# Patient Record
Sex: Male | Born: 1950 | Race: White | Hispanic: No | Smoking: Current every day smoker
Health system: Southern US, Community
[De-identification: ages and names within clinical notes are randomized; demographics above are authoritative.]

## PROBLEM LIST (undated history)

## (undated) DIAGNOSIS — C801 Malignant (primary) neoplasm, unspecified: Secondary | ICD-10-CM

## (undated) DIAGNOSIS — Z923 Personal history of irradiation: Secondary | ICD-10-CM

---

## 2013-05-05 HISTORY — PX: LUNG BIOPSY: SHX232

## 2013-05-12 ENCOUNTER — Telehealth: Payer: Self-pay | Admitting: Internal Medicine

## 2013-05-12 NOTE — Telephone Encounter (Signed)
C/D 05/12/13 for appt. 05/14/13 °

## 2013-05-13 ENCOUNTER — Telehealth: Payer: Self-pay | Admitting: *Deleted

## 2013-05-13 NOTE — Telephone Encounter (Signed)
Left VM message regard appt for MTOC 05/14/13 at 1:00

## 2013-05-14 ENCOUNTER — Encounter: Payer: Self-pay | Admitting: Internal Medicine

## 2013-05-14 ENCOUNTER — Encounter: Payer: Self-pay | Admitting: *Deleted

## 2013-05-14 ENCOUNTER — Ambulatory Visit (HOSPITAL_BASED_OUTPATIENT_CLINIC_OR_DEPARTMENT_OTHER): Payer: Self-pay | Admitting: Internal Medicine

## 2013-05-14 ENCOUNTER — Ambulatory Visit
Admission: RE | Admit: 2013-05-14 | Discharge: 2013-05-14 | Disposition: A | Discharge status: Home / Self Care | Payer: Self-pay | Source: Ambulatory Visit | Attending: Radiation Oncology | Admitting: Radiation Oncology

## 2013-05-14 ENCOUNTER — Encounter: Payer: Self-pay | Admitting: Specialist

## 2013-05-14 ENCOUNTER — Telehealth: Payer: Self-pay | Admitting: Internal Medicine

## 2013-05-14 ENCOUNTER — Telehealth: Payer: Self-pay | Admitting: *Deleted

## 2013-05-14 DIAGNOSIS — F17200 Nicotine dependence, unspecified, uncomplicated: Secondary | ICD-10-CM

## 2013-05-14 DIAGNOSIS — C3490 Malignant neoplasm of unspecified part of unspecified bronchus or lung: Secondary | ICD-10-CM

## 2013-05-14 NOTE — Patient Instructions (Signed)
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 We discussed treatment options including systemic chemotherapy with radiation. Followup visit in 3 weeks.

## 2013-05-14 NOTE — Progress Notes (Signed)
I met Jacob Webb and his daughter, Jacob Webb, in thoracic clinic today.  I provided them with information on available services in the Patient and [facility], as well as my contact information.  The one need they identified today was for financial assistance to keep Jacob Webb's phone service turned on.  I will make a referral to Kathrin Penner, social worker, to assist them with this matter.

## 2013-05-14 NOTE — Telephone Encounter (Signed)
Per staff phone call and POF I have schedueld appts.  JMW  

## 2013-05-14 NOTE — Progress Notes (Signed)
[facility] [phone]   [phone]  CONSULT NOTE  REFERRING PHYSICIAN: Veverly Fells PA-C  REASON FOR CONSULTATION:  62 years old white male diagnosed with lung cancer.   HPI Zackry Deines is a 62 y.o. male with no significant past medical history but long history of smoking. The patient presented to the emergency Department at [facility] medical center less than 2 weeks ago complaining of left-sided chest pain. Chest x-ray at that time showed left lung mass. This was followed by CT scan of the chest on 04/27/2013 and it showed heterogeneous mass in the left superior hilar region extending into the left hilum with encasement of the left pulmonary artery and left pulmonary veins. The mass measured 3.9 x 6.9 x 9.4 CM. The mass extended posteriorly and medially to the pleura. The left pulmonary artery was narrowed by 30%. There are small subcentimeter precarinal lymph nodes as well as left-sided mediastinal lymphadenopathy and left hilar lymphadenopathy. On 05/05/2013 the patient underwent CT-guided needle biopsy of the left upper lobe lung mass and the final pathology from [facility] (case # [id number]) showed small cell carcinoma. The tumor cells were immunoreactive for TTF 1, Pan keratin, CD 56 and CK7. there was focal reactivity for synaptophysin. The tumor cells were nonreactive for CK 5/6 and CK 20. This pattern of immunoreactivity as well as the histologic appearance is most consistent with small cell carcinoma. The patient was referred to me today for further evaluation and recommendation regarding treatment of his condition. When seen today he still complaining of left-sided chest pain as well as fatigue. He is currently on hydrocodone for pain management but he does not take much of it and he takes only half a tablet of aspirin which does help his pain. He also has shortness breath at baseline and increased with exertion as well as mild  cough but no hemoptysis. He has no significant weight loss. He has no headache or visual changes.  PAST MEDICAL HISTORY: Unremarkable. The patient denied having any history of hypertension, diabetes mellitus, chronic disease, hypercholesterolemia or stroke.  FAMILY HISTORY: Mother died from brain aneurysm.  SOCIAL HISTORY: He is sedated for 17 years. He was accompanied by his daughter Wanda Plump. He is currently retired and used to work as a Naval architect. The patient has a history of smoking 2 packs per day for around 40 years and unfortunately he continues to smoke. I strongly encouraged him to quit smoking and would offer him smoke cessation counseling. He has no history of alcohol drug abuse.   ALLERGIES: No known drug allergies.  Current Outpatient Prescriptions  Medication Sig Dispense Refill  . HYDROcodone-acetaminophen (NORCO/VICODIN) 5-325 MG per tablet Take 1 tablet by mouth every 6 (six) hours as needed for pain.       No current facility-administered medications for this visit.    Review of Systems  A comprehensive review of systems was negative except for: Constitutional: positive for anorexia, fatigue and weight loss Respiratory: positive for cough, dyspnea on exertion and sputum  Physical Exam  XBJ:YNWGN, healthy, no distress, well nourished and well developed SKIN: skin color, texture, turgor are normal HEAD: Normocephalic, No masses, lesions, tenderness or abnormalities EYES: normal, PERRLA EARS: External ears normal OROPHARYNX:no exudate and no erythema  NECK: supple, no adenopathy LYMPH:  no palpable lymphadenopathy, no hepatosplenomegaly LUNGS: clear to auscultation  HEART: regular rate & rhythm, no murmurs and no gallops ABDOMEN:abdomen soft, non-tender, normal bowel sounds and no masses or  organomegaly BACK: Back symmetric, no curvature. EXTREMITIES:no joint deformities, effusion, or inflammation, no edema, no skin discoloration, no clubbing  NEURO: alert &  oriented x 3 with fluent speech, no focal motor/sensory deficits  PERFORMANCE STATUS: ECOG 1  LABORATORY DATA: No results found for this basename: WBC, HGB, HCT, MCV, PLT      Chemistry   No results found for this basename: NA, K, CL, CO2, BUN, CREATININE, GLU   No results found for this basename: CALCIUM, ALKPHOS, AST, ALT, BILITOT       RADIOGRAPHIC STUDIES: No results found.  ASSESSMENT: This is a very pleasant 62 years old white male recently diagnosed with small cell lung cancer, limited stage, pending staging workup. It presented with a large left upper lobe lung mass with extension to the mediastinum.   PLAN: I have a lengthy discussion with the patient and his daughter today about his disease stage, prognosis and treatment options. I recommended for the patient to complete the staging workup by ordering a PET scan as well as MRI of the brain. I also discussed with the patient his treatment options which most likely will be in the form of systemic chemotherapy with carboplatin for AUC of 5 on day 1 and etoposide 120 mg/M2 on days 1, 2 and 3 with Neulasta support on day 4. I discussed with the patient adverse effect of the chemotherapy including but not limited to alopecia, myelosuppression, nausea and vomiting, peripheral neuropathy, liver or renal dysfunction. The patient would like to proceed with treatment as planned. He is expected to start the first cycle of his systemic chemotherapy on 05/25/2013.  He would have a chemotherapy education class before starting the first cycle of chemotherapy. As is no evidence for metastatic disease, the patient will be also treated with concurrent radiation starting with cycle #2. He was seen by Dr. Mitzi Hansen later today for discussion of this option. I will call his pharmacy with prescription for Compazine 10 mg by mouth every 6 hours as needed for nausea. I gave the patient and his daughter the time to ask questions and I answered them  completely to their satisfactions. The patient and his daughter voice understanding of his disease stage, prognosis and treatment options All questions were answered. The patient knows to call the clinic with any problems, questions or concerns. We can certainly see the patient much sooner if necessary.  Thank you so much for allowing me to participate in the care of [facility]. I will continue to follow up the patient with you and assist in his care.  I spent 40 minutes counseling the patient face to face. The total time spent in the appointment was 60 minutes.  Harbour Nordmeyer K. 05/14/2013, 2:09 PM

## 2013-05-14 NOTE — Progress Notes (Signed)
   Thoracic Treatment Summary Name: Jacob Webb Date: 05/14/13 DOB: 10/30/1951 Your Medical Team Medical Oncologist: Dr. Arbutus Ped Radiation Oncologist: Dr. Mitzi Hansen Pulmonologist: Dr. Sherene Sires  Type and Stage of Lung Cancer  Small Cell Carcinoma: Limited Pathological Stage:  Clinical stage is based on radiology exams.  Pathological stage will be determined after surgery.  Staging is based on the size of the tumor, involvement of lymph nodes or not, and whether or not the cancer center has spread. Recommendations Recommendations: PET scan, Brain MRI, Chemo Education class, Chemotherapy, Radiation therapy  These recommendations are based on information available as of today's consult.  This is subject to change depending further testing or exams. Next Steps Next Step: 1. Central schedulers will call with appointment for MRI Brain and PET scan 2. [facility] will schedule Chemo education class and chemotherapy to start June 9th 3. Radiation Oncology will call with appointment for radiation  Barriers to Care What do you perceive as a potential barrier that may prevent you from receiving your treatment plan? Patient perceives financial difficulties as a barrier.  No one in the home works at this time.  Family is set up to see Financial [employer]ocates today for help.  I will contac Questions [name], RN BSN Thoracic Oncology Nurse Navigator at [phone]  [name] is a nurse navigator that is available to assist you through your cancer journey.  She can answer your questions and/or provide resources regarding your treatment plan, emotional support, or financial concerns.

## 2013-05-14 NOTE — Telephone Encounter (Signed)
Gave pt appt for June 2014 lab, chemo class and MD

## 2013-05-15 ENCOUNTER — Encounter: Payer: Self-pay | Admitting: *Deleted

## 2013-05-15 DIAGNOSIS — C3490 Malignant neoplasm of unspecified part of unspecified bronchus or lung: Secondary | ICD-10-CM

## 2013-05-15 DIAGNOSIS — C3410 Malignant neoplasm of upper lobe, unspecified bronchus or lung: Secondary | ICD-10-CM | POA: Insufficient documentation

## 2013-05-15 NOTE — Progress Notes (Signed)
Gas card application sent

## 2013-05-15 NOTE — Progress Notes (Signed)
Radiation Oncology         [phone] ________________________________  Name: Jacob Webb MRN: [id number]  Date: 05/14/2013  DOB: 05/09/1951  CC: Si Gaul, M.D.   REFERRING PHYSICIAN: Si Gaul, M.D.   DIAGNOSIS: Small cell lung cancer  HISTORY OF PRESENT ILLNESS::Jacob Webb is a 62 y.o. male who is seen for an initial consultation visit. The patient was evaluated in the emergency department with a history of chest pain which she states was present for a couple of weeks. This was left-sided. The patient states that he also had an episode of shortness of breath but no ongoing difficulties in this regard. A chest x-ray was performed and this showed a left lung mass which was suspicious.  The patient proceeded to undergo a CT scan of the chest. We have the films available for review today and they have been personally reviewed. This CT scan demonstrated a 9.4 cm left suprahilar mass. Some small mediastinal lymph nodes were present but no clear involvement.  The patient proceeded to undergo a CT-guided needle biopsy of the left upper lobe lung mass. This has returned positive for small cell carcinoma.  The patient is seen today in multidisciplinary thoracic clinic for treatment options and evaluation. He states today that he denies any significant pain other than some chest discomfort on the left which was his initial presentation. He has had slight weight loss, no more than 3 or 4 pounds. He denies any significant headaches or vision changes. No severe or ongoing nausea.   PREVIOUS RADIATION THERAPY: No   PAST MEDICAL HISTORY:  has no past medical history on file.     PAST SURGICAL HISTORY:No past surgical history on file.   FAMILY HISTORY: family history is not on file.   SOCIAL HISTORY:     ALLERGIES: Review of patient's allergies indicates not on file.   MEDICATIONS:  Current Outpatient Prescriptions  Medication Sig Dispense Refill  .  HYDROcodone-acetaminophen (NORCO/VICODIN) 5-325 MG per tablet Take 1 tablet by mouth every 6 (six) hours as needed for pain.       No current facility-administered medications for this encounter.     REVIEW OF SYSTEMS:  A 15 point review of systems is documented in the electronic medical record. This was obtained by the nursing staff. However, I reviewed this with the patient to discuss relevant findings and make appropriate changes.  Pertinent items are noted in HPI.    PHYSICAL EXAM:  vitals were not taken for this visit.  General: Well-developed, in no acute distress HEENT: Normocephalic, atraumatic; oral cavity clear Neck: Supple without any lymphadenopathy Cardiovascular: Regular rate and rhythm Respiratory: Clear to auscultation bilaterally GI: Soft, nontender, normal bowel sounds Extremities: No edema present Neuro: No focal deficits     LABORATORY DATA:  No results found for this basename: WBC, HGB, HCT, MCV, PLT   No results found for this basename: NA, K, CL, CO2   No results found for this basename: ALT, AST, GGT, ALKPHOS, BILITOT      RADIOGRAPHY: No results found.     IMPRESSION: The patient has a new diagnosis of small cell lung cancer. Based on his workup thus far, there is no evidence for distant disease. He does or further workup including a PET scan and a brain MRI scan. The patient at this time does appear to be an appropriate candidate for chemoradiotherapy and this was discussed with him.  I discussed the role of radiotherapy in this setting. We discussed the rationale and  potential benefit for thoracic radiotherapy. We also discussed the potential side effects and risks. All of his questions were answered. I would anticipate a 6-1/2 week course of treatment. I discussed with Dr. Shirline Frees beginning his radiotherapy at the beginning of cycle 2 of his planned chemotherapy.  The patient does wish to proceed with this treatment plan.   PLAN: I will followup  on his pending studies for further staging and we will schedule the patient for simulation at the appropriate time according to the above timeframe.    I spent 60 minutes minutes face to face with the patient and more than 50% of that time was spent in counseling and/or coordination of care.    ________________________________   Radene Gunning, MD, PhD

## 2013-05-20 ENCOUNTER — Other Ambulatory Visit: Payer: Self-pay | Admitting: *Deleted

## 2013-05-20 ENCOUNTER — Encounter: Payer: Self-pay | Admitting: *Deleted

## 2013-05-20 ENCOUNTER — Telehealth: Payer: Self-pay | Admitting: *Deleted

## 2013-05-20 ENCOUNTER — Other Ambulatory Visit: Payer: Self-pay

## 2013-05-20 ENCOUNTER — Telehealth: Payer: Self-pay | Admitting: Internal Medicine

## 2013-05-20 ENCOUNTER — Encounter: Payer: Self-pay | Admitting: Internal Medicine

## 2013-05-20 DIAGNOSIS — C3490 Malignant neoplasm of unspecified part of unspecified bronchus or lung: Secondary | ICD-10-CM

## 2013-05-20 MED ORDER — PROCHLORPERAZINE MALEATE 10 MG PO TABS: 10.0000 mg | ORAL_TABLET | Freq: Four times a day (QID) | ORAL | Status: DC | PRN

## 2013-05-20 MED ORDER — HYDROCODONE-ACETAMINOPHEN 5-325 MG PO TABS: 1.0000 | ORAL_TABLET | Freq: Four times a day (QID) | ORAL | Status: DC | PRN

## 2013-05-20 NOTE — Telephone Encounter (Signed)
No additional notes

## 2013-05-20 NOTE — Telephone Encounter (Signed)
schedule pt for Nut appt on 6.29 @ 12

## 2013-05-20 NOTE — Progress Notes (Signed)
Spoke with patient after chemo education. He is getting food stamps only. Still not working. He did turn in forms for ssi/medicaid. They gave him more papers to fill out. Lauren got a message to that he wanted to try and get a phone. I advised him to call back on today to see if another form is needed. He signed application for Neulasta. Per Karel Jarvis she will use his w-2 from 2013. He has not worked since March 2014. No source of income right now. He lives with daughters and they were living off of her taxes. It is almost depleted. He was a Naval architect.

## 2013-05-21 ENCOUNTER — Ambulatory Visit (HOSPITAL_COMMUNITY)
Admission: RE | Admit: 2013-05-21 | Discharge: 2013-05-21 | Disposition: A | Discharge status: Home / Self Care | Payer: Medicaid Other | Source: Ambulatory Visit | Attending: Internal Medicine | Admitting: Internal Medicine

## 2013-05-21 ENCOUNTER — Encounter (HOSPITAL_COMMUNITY)
Admission: RE | Admit: 2013-05-21 | Discharge: 2013-05-21 | Disposition: A | Discharge status: Home / Self Care | Payer: Medicaid Other | Source: Ambulatory Visit | Attending: Internal Medicine | Admitting: Internal Medicine

## 2013-05-21 DIAGNOSIS — C3490 Malignant neoplasm of unspecified part of unspecified bronchus or lung: Secondary | ICD-10-CM | POA: Insufficient documentation

## 2013-05-21 DIAGNOSIS — J3489 Other specified disorders of nose and nasal sinuses: Secondary | ICD-10-CM | POA: Insufficient documentation

## 2013-05-21 DIAGNOSIS — G319 Degenerative disease of nervous system, unspecified: Secondary | ICD-10-CM | POA: Insufficient documentation

## 2013-05-21 DIAGNOSIS — G9389 Other specified disorders of brain: Secondary | ICD-10-CM | POA: Insufficient documentation

## 2013-05-21 LAB — POCT I-STAT, CHEM 8
BUN: 21 mg/dL (ref 6–23)
Chloride: 107 mEq/L (ref 96–112)
Glucose, Bld: 84 mg/dL (ref 70–99)
HCT: 48 % (ref 39.0–52.0)
Potassium: 4.1 mEq/L (ref 3.5–5.1)

## 2013-05-21 LAB — GLUCOSE, CAPILLARY: Glucose-Capillary: 92 mg/dL (ref 70–99)

## 2013-05-21 MED ORDER — FLUDEOXYGLUCOSE F - 18 (FDG) INJECTION
17.0000 | Freq: Once | INTRAVENOUS | Status: AC | PRN
  Administered 2013-05-21: 17 via INTRAVENOUS

## 2013-05-21 MED ORDER — GADOBENATE DIMEGLUMINE 529 MG/ML IV SOLN
15.0000 mL | Freq: Once | INTRAVENOUS | Status: AC | PRN
  Administered 2013-05-21: 15 mL via INTRAVENOUS

## 2013-05-25 ENCOUNTER — Ambulatory Visit: Payer: Self-pay | Admitting: Nutrition

## 2013-05-25 ENCOUNTER — Other Ambulatory Visit (HOSPITAL_BASED_OUTPATIENT_CLINIC_OR_DEPARTMENT_OTHER): Payer: Self-pay

## 2013-05-25 ENCOUNTER — Ambulatory Visit (HOSPITAL_BASED_OUTPATIENT_CLINIC_OR_DEPARTMENT_OTHER): Payer: Medicaid Other

## 2013-05-25 ENCOUNTER — Encounter: Payer: Self-pay | Admitting: *Deleted

## 2013-05-25 ENCOUNTER — Other Ambulatory Visit: Payer: Self-pay | Admitting: Internal Medicine

## 2013-05-25 VITALS — BP 122/77 | HR 87 | Temp 97.6°F | Resp 18 | Ht 68.5 in

## 2013-05-25 DIAGNOSIS — C3490 Malignant neoplasm of unspecified part of unspecified bronchus or lung: Secondary | ICD-10-CM

## 2013-05-25 DIAGNOSIS — C3410 Malignant neoplasm of upper lobe, unspecified bronchus or lung: Secondary | ICD-10-CM

## 2013-05-25 DIAGNOSIS — Z5111 Encounter for antineoplastic chemotherapy: Secondary | ICD-10-CM

## 2013-05-25 LAB — COMPREHENSIVE METABOLIC PANEL (CC13)
AST: 23 U/L (ref 5–34)
Albumin: 3.9 g/dL (ref 3.5–5.0)
BUN: 18.3 mg/dL (ref 7.0–26.0)
CO2: 25 mEq/L (ref 22–29)
Calcium: 9.7 mg/dL (ref 8.4–10.4)
Chloride: 107 mEq/L (ref 98–107)
Glucose: 94 mg/dl (ref 70–99)
Potassium: 4.1 mEq/L (ref 3.5–5.1)

## 2013-05-25 LAB — CBC WITH DIFFERENTIAL/PLATELET
Basophils Absolute: 0.1 10*3/uL (ref 0.0–0.1)
Eosinophils Absolute: 0.2 10*3/uL (ref 0.0–0.5)
HCT: 45.4 % (ref 38.4–49.9)
HGB: 15.5 g/dL (ref 13.0–17.1)
LYMPH%: 30.3 % (ref 14.0–49.0)
MONO#: 0.8 10*3/uL (ref 0.1–0.9)
NEUT%: 59 % (ref 39.0–75.0)
Platelets: 174 10*3/uL (ref 140–400)
WBC: 9.6 10*3/uL (ref 4.0–10.3)
lymph#: 2.9 10*3/uL (ref 0.9–3.3)

## 2013-05-25 MED ORDER — SODIUM CHLORIDE 0.9 % IV SOLN
Freq: Once | INTRAVENOUS | Status: AC
  Administered 2013-05-25: 10:00:00 via INTRAVENOUS

## 2013-05-25 MED ORDER — DEXAMETHASONE SODIUM PHOSPHATE 20 MG/5ML IJ SOLN
20.0000 mg | Freq: Once | INTRAMUSCULAR | Status: AC
  Administered 2013-05-25: 20 mg via INTRAVENOUS

## 2013-05-25 MED ORDER — ONDANSETRON 16 MG/50ML IVPB (CHCC)
16.0000 mg | Freq: Once | INTRAVENOUS | Status: AC
  Administered 2013-05-25: 16 mg via INTRAVENOUS

## 2013-05-25 MED ORDER — SODIUM CHLORIDE 0.9 % IV SOLN
538.5000 mg | Freq: Once | INTRAVENOUS | Status: AC
  Administered 2013-05-25: 540 mg via INTRAVENOUS
  Filled 2013-05-25: qty 54

## 2013-05-25 MED ORDER — SODIUM CHLORIDE 0.9 % IV SOLN
120.0000 mg/m2 | Freq: Once | INTRAVENOUS | Status: AC
  Administered 2013-05-25: 230 mg via INTRAVENOUS
  Filled 2013-05-25: qty 11.5

## 2013-05-25 NOTE — Patient Instructions (Addendum)
[facility] Discharge Instructions for Patients Receiving Chemotherapy  Today you received the following chemotherapy agents Etoposide/Carboplatin.  To help prevent nausea and vomiting after your treatment, we encourage you to take your nausea medication as prescribed.   If you develop nausea and vomiting that is not controlled by your nausea medication, call the clinic.   BELOW ARE SYMPTOMS THAT SHOULD BE REPORTED IMMEDIATELY:  *FEVER GREATER THAN 100.5 F  *CHILLS WITH OR WITHOUT FEVER  NAUSEA AND VOMITING THAT IS NOT CONTROLLED WITH YOUR NAUSEA MEDICATION  *UNUSUAL SHORTNESS OF BREATH  *UNUSUAL BRUISING OR BLEEDING  TENDERNESS IN MOUTH AND THROAT WITH OR WITHOUT PRESENCE OF ULCERS  *URINARY PROBLEMS  *BOWEL PROBLEMS  UNUSUAL RASH Items with * indicate a potential emergency and should be followed up as soon as possible.  Feel free to call the clinic you have any questions or concerns. The clinic phone number is [phone].    

## 2013-05-25 NOTE — Progress Notes (Signed)
This is a 62 year old male patient of Dr. Shirline Frees diagnosed with lung cancer.   Past medical history is not significant.  Medications include Compazine.   Labs were reviewed.  Height: 68.5 inches.  Weight: 166.3 pounds. Usual body weight: 170 -175 pounds. BMI: 24.92.  Patient denies difficulty eating at this time. He reports he lives with his daughter. His appetite is okay. He denies nutrition side effects except for nausea and spoke of concerns of this, however,Compazine has been ordered.  Nutrition diagnosis: Unintended weight loss related to diagnosis of lung cancer as evidenced by approximately 5% weight loss from usual body weight.  Intervention: Patient was educated on the importance of small frequent meals utilizing higher calorie, higher protein foods to promote weight maintenance. I've encouraged patient to consume oral nutrition supplements as tolerated. I have provided fact sheets and coupons for him to take with him today. I've provided him with one complementary case of Ensure Plus. I've answered his questions. Teach back method used.  Monitoring, evaluation, goals: Patient will tolerate increased calories and protein to minimize further weight loss.  Next visit: Tuesday, July 1, during chemotherapy.

## 2013-05-26 ENCOUNTER — Ambulatory Visit (HOSPITAL_BASED_OUTPATIENT_CLINIC_OR_DEPARTMENT_OTHER): Payer: Self-pay

## 2013-05-26 VITALS — BP 136/62 | HR 110 | Temp 97.8°F | Resp 20

## 2013-05-26 DIAGNOSIS — C3410 Malignant neoplasm of upper lobe, unspecified bronchus or lung: Secondary | ICD-10-CM

## 2013-05-26 DIAGNOSIS — Z5111 Encounter for antineoplastic chemotherapy: Secondary | ICD-10-CM

## 2013-05-26 DIAGNOSIS — C3490 Malignant neoplasm of unspecified part of unspecified bronchus or lung: Secondary | ICD-10-CM

## 2013-05-26 MED ORDER — SODIUM CHLORIDE 0.9 % IV SOLN
Freq: Once | INTRAVENOUS | Status: AC
  Administered 2013-05-26: 12:00:00 via INTRAVENOUS

## 2013-05-26 MED ORDER — SODIUM CHLORIDE 0.9 % IV SOLN
120.0000 mg/m2 | Freq: Once | INTRAVENOUS | Status: AC
  Administered 2013-05-26: 230 mg via INTRAVENOUS
  Filled 2013-05-26: qty 11.5

## 2013-05-26 MED ORDER — ONDANSETRON 8 MG/50ML IVPB (CHCC)
8.0000 mg | Freq: Once | INTRAVENOUS | Status: AC
  Administered 2013-05-26: 8 mg via INTRAVENOUS

## 2013-05-26 MED ORDER — DEXAMETHASONE SODIUM PHOSPHATE 10 MG/ML IJ SOLN
10.0000 mg | Freq: Once | INTRAMUSCULAR | Status: AC
  Administered 2013-05-26: 10 mg via INTRAVENOUS

## 2013-05-26 NOTE — Patient Instructions (Signed)
[facility] Discharge Instructions for Patients Receiving Chemotherapy  Today you received the following chemotherapy agents etoposide  To help prevent nausea and vomiting after your treatment, we encourage you to take your nausea medication as needed   If you develop nausea and vomiting that is not controlled by your nausea medication, call the clinic.   BELOW ARE SYMPTOMS THAT SHOULD BE REPORTED IMMEDIATELY:  *FEVER GREATER THAN 100.5 F  *CHILLS WITH OR WITHOUT FEVER  NAUSEA AND VOMITING THAT IS NOT CONTROLLED WITH YOUR NAUSEA MEDICATION  *UNUSUAL SHORTNESS OF BREATH  *UNUSUAL BRUISING OR BLEEDING  TENDERNESS IN MOUTH AND THROAT WITH OR WITHOUT PRESENCE OF ULCERS  *URINARY PROBLEMS  *BOWEL PROBLEMS  UNUSUAL RASH Items with * indicate a potential emergency and should be followed up as soon as possible.  Feel free to call the clinic you have any questions or concerns. The clinic phone number is [phone].    

## 2013-05-27 ENCOUNTER — Ambulatory Visit (HOSPITAL_BASED_OUTPATIENT_CLINIC_OR_DEPARTMENT_OTHER): Payer: Medicaid Other

## 2013-05-27 VITALS — BP 129/77 | HR 98 | Temp 97.8°F | Resp 20

## 2013-05-27 DIAGNOSIS — C3490 Malignant neoplasm of unspecified part of unspecified bronchus or lung: Secondary | ICD-10-CM

## 2013-05-27 DIAGNOSIS — C3410 Malignant neoplasm of upper lobe, unspecified bronchus or lung: Secondary | ICD-10-CM

## 2013-05-27 DIAGNOSIS — Z5111 Encounter for antineoplastic chemotherapy: Secondary | ICD-10-CM

## 2013-05-27 MED ORDER — SODIUM CHLORIDE 0.9 % IV SOLN
120.0000 mg/m2 | Freq: Once | INTRAVENOUS | Status: AC
  Administered 2013-05-27: 230 mg via INTRAVENOUS
  Filled 2013-05-27: qty 11.5

## 2013-05-27 MED ORDER — ONDANSETRON 8 MG/50ML IVPB (CHCC)
8.0000 mg | Freq: Once | INTRAVENOUS | Status: AC
  Administered 2013-05-27: 8 mg via INTRAVENOUS

## 2013-05-27 MED ORDER — SODIUM CHLORIDE 0.9 % IV SOLN
Freq: Once | INTRAVENOUS | Status: AC
  Administered 2013-05-27: 14:00:00 via INTRAVENOUS

## 2013-05-27 MED ORDER — DEXAMETHASONE SODIUM PHOSPHATE 10 MG/ML IJ SOLN
10.0000 mg | Freq: Once | INTRAMUSCULAR | Status: AC
  Administered 2013-05-27: 10 mg via INTRAVENOUS

## 2013-05-27 NOTE — Patient Instructions (Addendum)
[facility] Discharge Instructions for Patients Receiving Chemotherapy  Today you received the following chemotherapy agents: Etoposide.  To help prevent nausea and vomiting after your treatment, we encourage you to take your nausea medication as prescribed.   If you develop nausea and vomiting that is not controlled by your nausea medication, call the clinic.   BELOW ARE SYMPTOMS THAT SHOULD BE REPORTED IMMEDIATELY:  *FEVER GREATER THAN 100.5 F  *CHILLS WITH OR WITHOUT FEVER  NAUSEA AND VOMITING THAT IS NOT CONTROLLED WITH YOUR NAUSEA MEDICATION  *UNUSUAL SHORTNESS OF BREATH  *UNUSUAL BRUISING OR BLEEDING  TENDERNESS IN MOUTH AND THROAT WITH OR WITHOUT PRESENCE OF ULCERS  *URINARY PROBLEMS  *BOWEL PROBLEMS  UNUSUAL RASH Items with * indicate a potential emergency and should be followed up as soon as possible.  Feel free to call the clinic you have any questions or concerns. The clinic phone number is [phone].    

## 2013-05-28 ENCOUNTER — Telehealth: Payer: Self-pay | Admitting: Medical Oncology

## 2013-05-28 ENCOUNTER — Ambulatory Visit: Payer: Self-pay

## 2013-05-28 NOTE — Telephone Encounter (Signed)
Lauren left message on voice mail that pt did not show up for an injection appointment and to please call Dr Asa Lente nurse.

## 2013-05-29 ENCOUNTER — Telehealth: Payer: Self-pay | Admitting: *Deleted

## 2013-05-29 ENCOUNTER — Ambulatory Visit (HOSPITAL_BASED_OUTPATIENT_CLINIC_OR_DEPARTMENT_OTHER): Payer: Self-pay

## 2013-05-29 ENCOUNTER — Other Ambulatory Visit: Payer: Self-pay | Admitting: Physician Assistant

## 2013-05-29 VITALS — BP 115/68 | HR 78 | Temp 97.5°F

## 2013-05-29 DIAGNOSIS — C3490 Malignant neoplasm of unspecified part of unspecified bronchus or lung: Secondary | ICD-10-CM

## 2013-05-29 DIAGNOSIS — C3410 Malignant neoplasm of upper lobe, unspecified bronchus or lung: Secondary | ICD-10-CM

## 2013-05-29 DIAGNOSIS — Z5111 Encounter for antineoplastic chemotherapy: Secondary | ICD-10-CM

## 2013-05-29 MED ORDER — PEGFILGRASTIM INJECTION 6 MG/0.6ML
6.0000 mg | Freq: Once | SUBCUTANEOUS | Status: AC
  Administered 2013-05-29: 6 mg via SUBCUTANEOUS
  Filled 2013-05-29: qty 0.6

## 2013-05-29 NOTE — Telephone Encounter (Signed)
Jacob Webb here for Neulasta injection following 1st carbo/vp 16 chemo treatment.  Patient was to be here yesterday for injection, but ran out of gas and couldn't get to a phone to call the office.  He doesn't have a phone at this time.   He states that he has been doing well.  No nausea, vomiting, or diarrhea.  All questions answered.  Knows to call for any problems or concerns.

## 2013-05-29 NOTE — Telephone Encounter (Signed)
Attempted again today to reach patient regarding FTKA for injection on 05/28/13 and chemo follow up call Left detailed message to please call us back, we can still get her in on Saturday if she calls back today. Cell # unavailable-no answer, no voicemail

## 2013-05-29 NOTE — Patient Instructions (Addendum)

## 2013-05-29 NOTE — Progress Notes (Signed)
LATE NOTE  CSW met with patient in CSW office to assess for psychosocial needs.  Jacob Webb he feels he is coping adequately with his cancer diagnosis.  CSW encouraged patient to meet with CSW periodically to process patient's emotional reactions and difficulties with treatment.  Jacob Webb is currently concerned with his financial situation.  CSW and patient discussed SSDI, SSI, Medicaid, and applications for non-profit cancer organizations.  CSW gave patient [employer] application and they plan to return to CSW when completed.  CSW will also refer patient to [facility] for disability assistance once all tests are completed.  Kathrin Penner, MSW, LCSW Clinical Social Worker [facility] [phone]

## 2013-06-01 ENCOUNTER — Ambulatory Visit (HOSPITAL_BASED_OUTPATIENT_CLINIC_OR_DEPARTMENT_OTHER): Payer: Self-pay | Admitting: Internal Medicine

## 2013-06-01 ENCOUNTER — Encounter: Payer: Self-pay | Admitting: Internal Medicine

## 2013-06-01 ENCOUNTER — Other Ambulatory Visit (HOSPITAL_BASED_OUTPATIENT_CLINIC_OR_DEPARTMENT_OTHER): Payer: Self-pay

## 2013-06-01 ENCOUNTER — Telehealth: Payer: Self-pay | Admitting: Medical Oncology

## 2013-06-01 ENCOUNTER — Telehealth: Payer: Self-pay | Admitting: Internal Medicine

## 2013-06-01 DIAGNOSIS — C7949 Secondary malignant neoplasm of other parts of nervous system: Secondary | ICD-10-CM

## 2013-06-01 DIAGNOSIS — C3490 Malignant neoplasm of unspecified part of unspecified bronchus or lung: Secondary | ICD-10-CM

## 2013-06-01 DIAGNOSIS — C7952 Secondary malignant neoplasm of bone marrow: Secondary | ICD-10-CM

## 2013-06-01 DIAGNOSIS — C801 Malignant (primary) neoplasm, unspecified: Secondary | ICD-10-CM | POA: Insufficient documentation

## 2013-06-01 LAB — CBC WITH DIFFERENTIAL/PLATELET
Basophils Absolute: 0 10*3/uL (ref 0.0–0.1)
EOS%: 1.4 % (ref 0.0–7.0)
HGB: 17 g/dL (ref 13.0–17.1)
MCH: 29.4 pg (ref 27.2–33.4)
MONO#: 0.3 10*3/uL (ref 0.1–0.9)
NEUT#: 6.1 10*3/uL (ref 1.5–6.5)
RDW: 13.8 % (ref 11.0–14.6)
WBC: 8.6 10*3/uL (ref 4.0–10.3)
lymph#: 2.1 10*3/uL (ref 0.9–3.3)

## 2013-06-01 LAB — COMPREHENSIVE METABOLIC PANEL (CC13)
AST: 18 U/L (ref 5–34)
Albumin: 3.8 g/dL (ref 3.5–5.0)
Alkaline Phosphatase: 146 U/L (ref 40–150)
Glucose: 157 mg/dl — ABNORMAL HIGH (ref 70–99)
Potassium: 5.1 mEq/L (ref 3.5–5.1)
Sodium: 136 mEq/L (ref 136–145)
Total Protein: 7.6 g/dL (ref 6.4–8.3)

## 2013-06-01 NOTE — Progress Notes (Signed)
Pt smoking 1/2 pack cigarettes/l week

## 2013-06-01 NOTE — Progress Notes (Signed)
[facility] [phone]   [phone]  OFFICE PROGRESS NOTE  No PCP Per Patient [address] [place] [address]  DIAGNOSIS: Extensive stage small cell lung cancer diagnosed in May of 2014  PRIOR THERAPY: None  CURRENT THERAPY: Systemic chemotherapy with carboplatin for AUC of 5 on day 1 and etoposide 120 mg/M2 on days 1, 2 and 3 with Neulasta support on day 4, status post 1 cycle. First cycle was started on 05/25/2013.  INTERVAL HISTORY: Jacob Webb 62 y.o. male returns to the clinic today for followup visit accompanied by his daughter. The patient tolerated the first week of his systemic chemotherapy fairly well except for aching pain and flulike symptoms after the Neulasta injection. He denied having any significant nausea or vomiting. The patient denied having any chest pain, shortness breath, cough or hemoptysis. He had a PET scan as well as MRI of the brain performed recently and he is here for evaluation and discussion of his scan results.   ALLERGIES:  has No Known Allergies.  MEDICATIONS:  Current Outpatient Prescriptions  Medication Sig Dispense Refill  . HYDROcodone-acetaminophen (NORCO/VICODIN) 5-325 MG per tablet Take 1 tablet by mouth every 6 (six) hours as needed for pain.  30 tablet  0  . prochlorperazine (COMPAZINE) 10 MG tablet Take 1 tablet (10 mg total) by mouth every 6 (six) hours as needed.  30 tablet  1   No current facility-administered medications for this visit.    REVIEW OF SYSTEMS:  A comprehensive review of systems was negative except for: Constitutional: positive for fatigue Musculoskeletal: positive for arthralgias   PHYSICAL EXAMINATION: General appearance: alert, cooperative, fatigued and no distress Head: Normocephalic, without obvious abnormality, atraumatic Neck: no adenopathy Lymph nodes: Cervical, supraclavicular, and axillary nodes normal. Resp: clear to auscultation bilaterally Cardio: regular  rate and rhythm, S1, S2 normal, no murmur, click, rub or gallop GI: soft, non-tender; bowel sounds normal; no masses,  no organomegaly Extremities: extremities normal, atraumatic, no cyanosis or edema Neurologic: Alert and oriented X 3, normal strength and tone. Normal symmetric reflexes. Normal coordination and gait  ECOG PERFORMANCE STATUS: 1 - Symptomatic but completely ambulatory  There were no vitals taken for this visit.  LABORATORY DATA: Lab Results  Component Value Date   WBC 8.6 06/01/2013   HGB 17.0 06/01/2013   HCT 49.3 06/01/2013   MCV 85.1 06/01/2013   PLT 121 confirmed* 06/01/2013      Chemistry      Component Value Date/Time   NA 142 05/25/2013 0942   NA 138 2013/06/02 1455   K 4.1 05/25/2013 0942   K 4.1 Jun 02, 2013 1455   CL 107 05/25/2013 0942   CL 107 Jun 02, 2013 1455   CO2 25 05/25/2013 0942   BUN 18.3 05/25/2013 0942   BUN 21 06/02/2013 1455   CREATININE 1.0 05/25/2013 0942   CREATININE 1.00 06-02-2013 1455      Component Value Date/Time   CALCIUM 9.7 05/25/2013 0942   ALKPHOS 103 05/25/2013 0942   AST 23 05/25/2013 0942   ALT 22 05/25/2013 0942   BILITOT 0.47 05/25/2013 0942       RADIOGRAPHIC STUDIES: Mr Laqueta Jean Wo Contrast  06/02/13   *RADIOLOGY REPORT*  Clinical Data: New diagnosis of small cell lung cancer.  MRI HEAD WITHOUT AND WITH CONTRAST  Technique:  Multiplanar, multiecho pulse sequences of the brain and surrounding structures were obtained according to standard protocol without and with intravenous contrast  Contrast: 15mL  MULTIHANCE GADOBENATE DIMEGLUMINE 529 MG/ML IV SOLN  Comparison: None.  Findings: A focal enhancing lesion in the right corona radiata measures 8 x 8 x 7 mm.  There is restricted diffusion associated with this lesion, compatible with a metastasis of a small cell lung cancer.  The right cerebellar lesion measures 5 mm maximally.  No additional lesions are evident at 1.5 Tesla.  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   Midline structures are otherwise within normal limits.  Flow is present in the major intracranial arteries.  The globes and orbits are intact.  Mild mucosal thickening is present in the ethmoid air cells, left frontal sinus, and maxillary sinuses bilaterally, left greater than right.  Fluid is present in the mastoid air cells bilaterally.  No obstructing nasopharyngeal lesion is evident.  IMPRESSION:  1.  Enhancing lesions within the right to frontal lobe corona radiata and right cerebellum are highly concerning for metastases of the small cell lung cancer with focal restricted diffusion. 2.  Mild generalized atrophy and minimal white matter disease is otherwise within normal limits for age. 3.  Mild diffuse sinus disease. 4.  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   Original Report Authenticated By: Marin Roberts, M.D.   Nm Pet Image Initial (pi) Skull Base To Thigh  05/21/2013   *RADIOLOGY REPORT*  Clinical Data:  Initial treatment strategy for staging of lung cancer.  Outside imaging demonstrating a 9.4 cm left suprahilar mass.  Biopsy demonstrating small cell carcinoma.  NUCLEAR MEDICINE WHOLE BODY PET CT  Technique:  Technique:  92 mCi F-18 FDG was injected intravenously. CT data was obtained and used for attenuation correction and anatomic localization only.  (This was not acquired as a diagnostic CT examination.) Additional exam technical data entered on technologist worksheet.  Comparison: 17  Findings: Neck: No abnormal hypermetabolism.  Chest:  Hypermetabolic left upper lobe/apical lung mass with direct extension into the left sided mediastinum.  At the apex, this measures 4.9 x 3.8 cm on image 70/series 2.  At the level of the AP window, the direct extension towards the mediastinum measures 5.4 x 3.7 cm.  This entire process measures a S.U.V. max of 24.2.  Abdomen/Pelvis:  Hypermetabolic exophytic right-sided renal lesion which  measures a S.U.V. max of 11.8, 1.4 cm,  and 32 HU on image 141/series 2.  Apparent hypermetabolism in the region of the sigmoid colon on image 219/series 2 is favored to be due to misregistration of the adjacent urinary bladder.  Skelton/Extremities:  Osseous metastasis involving the left side of the vertebral body and left pedicle at T1.  This is CT occult.  It measures a S.U.V. max of 8.2 on image 53/series 2. A focus of hypermetabolism within the medial aspect of the left femoral neck is also CT occult.  This measures a S.U.V. max of 6.6 on image 234/series 2.  CT images performed for attenuation correction demonstrate mucous retention cyst or polyp in the right maxillary sinus.  Mucosal thickening in the left maxillary sinus.  LAD coronary artery atherosclerosis on image 97.  Moderate centrilobular emphysema.  Biapical pleural parenchymal scarring.  L5 bilateral pars defects.  IMPRESSION:  1.  Infiltrative left apical/upper lobe lung mass with direct extension into the mediastinum.  2.  Osseous metastasis. 3.   A right renal hypermetabolic lesion which is technically indeterminate on this unenhanced CT. This could represent a hemorrhagic cyst or a solid neoplasm.  Given hypermetabolism, suspicious for a renal cell carcinoma.  Pre and post contrast MRI (favored) or CT should be considered. 4.  Coronary artery atherosclerosis.   Original Report Authenticated By: Jeronimo Greaves, M.D.    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 I have a lengthy discussion with the patient and his daughter today about his current disease stage and treatment options. I showed them the images of the PET scan as well as MRI of the brain. I recommended for the patient to continue his current systemic chemotherapy was carboplatin and etoposide as scheduled. After cycle #2 the patient will have repeat MRI of the brain and I  may give him a break proceed with whole brain irradiation then resume his chemotherapy. The patient is currently seen by Dr. Mitzi Hansen and he would consider him for the whole brain irradiation after cycle #2. He would come back for followup visit in 2 weeks for evaluation before starting the next cycle of his chemotherapy. The patient was advised to call immediately if he has any concerning symptoms in the interval.  All questions were answered. The patient knows to call the clinic with any problems, questions or concerns. We can certainly see the patient much sooner if necessary.  I spent 15 minutes counseling the patient face to face. The total time spent in the appointment was 25 minutes.

## 2013-06-01 NOTE — Progress Notes (Signed)
Diane - nurse called and advised the patient needs help with meds. He came by and signed 400.00 one time grant form and gave letter of medicaid/disabililty is being reviewed.

## 2013-06-01 NOTE — Telephone Encounter (Signed)
Pt qualifies for drug assistance. He only got 1/2 of his vidocin rx at [facility] because he could not afford the full rx or the compazine so i called in #15 tablets of vicodin and the full compazine  to [facility] outpt pharmacy.

## 2013-06-01 NOTE — Patient Instructions (Addendum)
we discussed the results of the PET scan as well as MRI of the brain. Will continue chemotherapy as scheduled, next cycle in 2 weeks. Followup visit in 2 weeks.

## 2013-06-01 NOTE — Progress Notes (Signed)
Spoke with pt today at [facility].  I told him that the lung cancer initiative has mailed his gas card.

## 2013-06-02 ENCOUNTER — Ambulatory Visit: Payer: Self-pay | Admitting: Internal Medicine

## 2013-06-02 ENCOUNTER — Other Ambulatory Visit: Payer: Self-pay | Admitting: Radiation Oncology

## 2013-06-03 ENCOUNTER — Ambulatory Visit: Payer: Self-pay | Admitting: Internal Medicine

## 2013-06-05 ENCOUNTER — Ambulatory Visit
Admission: RE | Admit: 2013-06-05 | Discharge: 2013-06-05 | Disposition: A | Discharge status: Home / Self Care | Payer: Medicaid Other | Source: Ambulatory Visit | Attending: Radiation Oncology | Admitting: Radiation Oncology

## 2013-06-05 ENCOUNTER — Ambulatory Visit
Admission: RE | Admit: 2013-06-05 | Discharge: 2013-06-05 | Disposition: A | Discharge status: Home / Self Care | Payer: Self-pay | Source: Ambulatory Visit | Attending: Radiation Oncology | Admitting: Radiation Oncology

## 2013-06-05 ENCOUNTER — Telehealth: Payer: Self-pay | Admitting: Oncology

## 2013-06-05 DIAGNOSIS — Z510 Encounter for antineoplastic radiation therapy: Secondary | ICD-10-CM | POA: Insufficient documentation

## 2013-06-05 DIAGNOSIS — R110 Nausea: Secondary | ICD-10-CM | POA: Insufficient documentation

## 2013-06-05 DIAGNOSIS — C3490 Malignant neoplasm of unspecified part of unspecified bronchus or lung: Secondary | ICD-10-CM | POA: Insufficient documentation

## 2013-06-05 NOTE — Progress Notes (Signed)
Jacob Webb here for follow up for lung cancer.  He does have pain in his left chest that he is rating at a 5/10 and pain in his back that he is rating at a 10/10.  He received a neulasta shot last Wednesday and is having back pain from that.  He had chemotherapy last Wednesday.  He does have fatigue.

## 2013-06-05 NOTE — Telephone Encounter (Signed)
Left a message for Jacob Webb asking if he was coming to his follow up appointment today.  Asked that he call [phone] when he received the message.

## 2013-06-08 ENCOUNTER — Other Ambulatory Visit (HOSPITAL_BASED_OUTPATIENT_CLINIC_OR_DEPARTMENT_OTHER): Payer: Self-pay | Admitting: Lab

## 2013-06-08 ENCOUNTER — Other Ambulatory Visit: Payer: Self-pay | Admitting: Internal Medicine

## 2013-06-08 DIAGNOSIS — C3490 Malignant neoplasm of unspecified part of unspecified bronchus or lung: Secondary | ICD-10-CM

## 2013-06-08 LAB — COMPREHENSIVE METABOLIC PANEL (CC13)
ALT: 43 U/L (ref 0–55)
Alkaline Phosphatase: 141 U/L (ref 40–150)
Sodium: 140 mEq/L (ref 136–145)
Total Bilirubin: <0.2 mg/dL (ref 0.20–1.20)
Total Protein: 6.5 g/dL (ref 6.4–8.3)

## 2013-06-08 LAB — CBC WITH DIFFERENTIAL/PLATELET
EOS%: 0.3 % (ref 0.0–7.0)
MCH: 29.1 pg (ref 27.2–33.4)
MCV: 84.9 fL (ref 79.3–98.0)
MONO%: 10.4 % (ref 0.0–14.0)
NEUT#: 5.7 10*3/uL (ref 1.5–6.5)
RBC: 4.37 10*6/uL (ref 4.20–5.82)
RDW: 14.3 % (ref 11.0–14.6)
nRBC: 0 % (ref 0–0)

## 2013-06-09 ENCOUNTER — Encounter: Payer: Self-pay | Admitting: *Deleted

## 2013-06-09 NOTE — Progress Notes (Signed)
Met with Jacob Webb on yesterday to assist with his '[facility]' application. Jacob Webb faces hardship with paying his rent.  Jacob Cool, LCSW Assistant Director Clinical Social Work

## 2013-06-09 NOTE — Progress Notes (Signed)
Radiation Oncology         [phone]) [phone] ________________________________  Name: Jacob Webb MRN: [id number]  Date: 06/05/2013  DOB: 1951/08/16  Follow-Up Visit Note  CC: No PCP Per Patient  No ref. provider found  Diagnosis:   Small cell lung cancer with brain metastasis  Interval Since Last Radiation:  Not applicable   Narrative:  The patient returns today for followup. The patient has been diagnosed with small cell lung cancer and is proceeding with chemotherapy. He states that this has gone reasonably well. He does note some pain in his chest area on the left which is moderate. He is having some significant back pain after his Neulasta injection in conjunction with chemotherapy. He is also complaining of some fatigue.  The patient had an MRI scan of the brain completed on 05/21/2013. This showed enhancing lesions intracranially which are consistent with metastatic disease. I am therefore seeing the patient back to coordinate a course of whole brain radiotherapy.                              ALLERGIES:  has No Known Allergies.  Meds: Current Outpatient Prescriptions  Medication Sig Dispense Refill  . prochlorperazine (COMPAZINE) 10 MG tablet Take 1 tablet (10 mg total) by mouth every 6 (six) hours as needed.  30 tablet  1  . HYDROcodone-acetaminophen (NORCO/VICODIN) 5-325 MG per tablet TAKE 1 TABLET BY MOUTH EVERY 6 HOURS AS NEEDED  15 tablet  0   No current facility-administered medications for this encounter.    Physical Findings: The patient is in no acute distress. Patient is alert and oriented.  height is 5' 8.5" (1.74 m) and weight is 168 lb 14.4 oz (76.613 kg). His temperature is 97.6 F (36.4 C). His blood pressure is 135/73 and his pulse is 117. His oxygen saturation is 99%. .   General: Well-developed, in no acute distress HEENT: Normocephalic, atraumatic Cardiovascular: Regular rate and rhythm Respiratory: Clear to auscultation bilaterally GI: Soft, nontender,  normal bowel sounds Extremities: No edema present   Lab Findings: Lab Results  Component Value Date   WBC 10.0 06/08/2013   HGB 12.7* 06/08/2013   HCT 37.1* 06/08/2013   MCV 84.9 06/08/2013   PLT 73 confirmed* 06/08/2013     Radiographic Findings: Mr Laqueta Jean WJ Contrast  05/21/2013   *RADIOLOGY REPORT*  Clinical Data: New diagnosis of small cell lung cancer.  MRI HEAD WITHOUT AND WITH CONTRAST  Technique:  Multiplanar, multiecho pulse sequences of the brain and surrounding structures were obtained according to standard protocol without and with intravenous contrast  Contrast: 15mL MULTIHANCE GADOBENATE DIMEGLUMINE 529 MG/ML IV SOLN  Comparison: None.  Findings: A focal enhancing lesion in the right corona radiata measures 8 x 8 x 7 mm.  There is restricted diffusion associated with this lesion, compatible with a metastasis of a small cell lung cancer.  The right cerebellar lesion measures 5 mm maximally.  No additional lesions are evident at 1.5 Tesla.  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  Midline structures are otherwise within normal limits.  Flow is present in the major intracranial arteries.  The globes and orbits are intact.  Mild mucosal thickening is present in the ethmoid air cells, left frontal sinus, and maxillary sinuses bilaterally, left greater than right.  Fluid is present in the mastoid air cells bilaterally.  No obstructing nasopharyngeal lesion is evident.  IMPRESSION:  1.  Enhancing lesions within the right to frontal lobe corona radiata and right cerebellum are highly concerning for metastases of the small cell lung cancer with focal restricted diffusion. 2.  Mild generalized atrophy and minimal white matter disease is otherwise within normal limits for age. 3.  Mild diffuse sinus disease. 4.  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   Original Report Authenticated By: Marin Roberts, M.D.   Nm Pet Image Initial (pi) Skull Base To Thigh  05/21/2013   *RADIOLOGY REPORT*  Clinical Data:  Initial treatment strategy for staging of lung cancer.  Outside imaging demonstrating a 9.4 cm left suprahilar mass.  Biopsy demonstrating small cell carcinoma.  NUCLEAR MEDICINE WHOLE BODY PET CT  Technique:  Technique:  92 mCi F-18 FDG was injected intravenously. CT data was obtained and used for attenuation correction and anatomic localization only.  (This was not acquired as a diagnostic CT examination.) Additional exam technical data entered on technologist worksheet.  Comparison: 17  Findings: Neck: No abnormal hypermetabolism.  Chest:  Hypermetabolic left upper lobe/apical lung mass with direct extension into the left sided mediastinum.  At the apex, this measures 4.9 x 3.8 cm on image 70/series 2.  At the level of the AP window, the direct extension towards the mediastinum measures 5.4 x 3.7 cm.  This entire process measures a S.U.V. max of 24.2.  Abdomen/Pelvis:  Hypermetabolic exophytic right-sided renal lesion which measures a S.U.V. max of 11.8, 1.4 cm,  and 32 HU on image 141/series 2.  Apparent hypermetabolism in the region of the sigmoid colon on image 219/series 2 is favored to be due to misregistration of the adjacent urinary bladder.  Skelton/Extremities:  Osseous metastasis involving the left side of the vertebral body and left pedicle at T1.  This is CT occult.  It measures a S.U.V. max of 8.2 on image 53/series 2. A focus of hypermetabolism within the medial aspect of the left femoral neck is also CT occult.  This measures a S.U.V. max of 6.6 on image 234/series 2.  CT images performed for attenuation correction demonstrate mucous retention cyst or polyp in the right maxillary sinus.  Mucosal thickening in the left maxillary sinus.  LAD coronary artery atherosclerosis on image 97.  Moderate centrilobular emphysema.  Biapical pleural parenchymal scarring.  L5 bilateral pars defects.   IMPRESSION:  1.  Infiltrative left apical/upper lobe lung mass with direct extension into the mediastinum.  2.  Osseous metastasis. 3.   A right renal hypermetabolic lesion which is technically indeterminate on this unenhanced CT. This could represent a hemorrhagic cyst or a solid neoplasm.  Given hypermetabolism, suspicious for a renal cell carcinoma.  Pre and post contrast MRI (favored) or CT should be considered. 4.  Coronary artery atherosclerosis.   Original Report Authenticated By: Jeronimo Greaves, M.D.    Impression:    The patient has metastatic small cell lung cancer. He is proceeding with chemotherapy currently. I have discussed this matter with Dr. Arbutus Ped and he will proceed with whole brain radiotherapy after 2 cycles of chemotherapy. I will also plan to deliver thoracic radiotherapy in a palliative manner during his whole brain radiation treatment to aid in further regression of the patient's disease.  Plan:  The patient will proceed with a simulation today such that we can proceed with treatment planning. We will begin whole brain radiotherapy, as well as palliative thoracic radiotherapy, on 06/29/2013. I anticipate 2 weeks of treatment.  I spent  10 minutes with the patient today, the majority of which was spent counseling the patient on the diagnosis of cancer and coordinating care.   Radene Gunning, M.D., Ph.D.

## 2013-06-15 ENCOUNTER — Ambulatory Visit (HOSPITAL_BASED_OUTPATIENT_CLINIC_OR_DEPARTMENT_OTHER): Payer: Medicaid Other

## 2013-06-15 ENCOUNTER — Encounter: Payer: Self-pay | Admitting: Internal Medicine

## 2013-06-15 ENCOUNTER — Other Ambulatory Visit (HOSPITAL_BASED_OUTPATIENT_CLINIC_OR_DEPARTMENT_OTHER): Payer: Self-pay

## 2013-06-15 ENCOUNTER — Ambulatory Visit (HOSPITAL_BASED_OUTPATIENT_CLINIC_OR_DEPARTMENT_OTHER): Payer: Self-pay | Admitting: Internal Medicine

## 2013-06-15 ENCOUNTER — Other Ambulatory Visit: Payer: Self-pay | Admitting: Lab

## 2013-06-15 ENCOUNTER — Other Ambulatory Visit: Payer: Self-pay | Admitting: Internal Medicine

## 2013-06-15 DIAGNOSIS — C3490 Malignant neoplasm of unspecified part of unspecified bronchus or lung: Secondary | ICD-10-CM

## 2013-06-15 DIAGNOSIS — C3410 Malignant neoplasm of upper lobe, unspecified bronchus or lung: Secondary | ICD-10-CM

## 2013-06-15 DIAGNOSIS — Z5111 Encounter for antineoplastic chemotherapy: Secondary | ICD-10-CM

## 2013-06-15 LAB — CBC WITH DIFFERENTIAL/PLATELET
Eosinophils Absolute: 0 10*3/uL (ref 0.0–0.5)
LYMPH%: 23.9 % (ref 14.0–49.0)
MCHC: 35.3 g/dL (ref 32.0–36.0)
MCV: 83.6 fL (ref 79.3–98.0)
MONO%: 8.8 % (ref 0.0–14.0)
NEUT#: 7.5 10*3/uL — ABNORMAL HIGH (ref 1.5–6.5)
Platelets: 231 10*3/uL (ref 140–400)
RBC: 4.51 10*6/uL (ref 4.20–5.82)
nRBC: 0 % (ref 0–0)

## 2013-06-15 LAB — COMPREHENSIVE METABOLIC PANEL (CC13)
ALT: 23 U/L (ref 0–55)
BUN: 11.7 mg/dL (ref 7.0–26.0)
CO2: 26 mEq/L (ref 22–29)
Creatinine: 0.8 mg/dL (ref 0.7–1.3)
Total Bilirubin: 0.4 mg/dL (ref 0.20–1.20)

## 2013-06-15 MED ORDER — DEXAMETHASONE SODIUM PHOSPHATE 20 MG/5ML IJ SOLN
20.0000 mg | Freq: Once | INTRAMUSCULAR | Status: AC
  Administered 2013-06-15: 20 mg via INTRAVENOUS

## 2013-06-15 MED ORDER — SODIUM CHLORIDE 0.9 % IV SOLN
120.0000 mg/m2 | Freq: Once | INTRAVENOUS | Status: AC
  Administered 2013-06-15: 230 mg via INTRAVENOUS
  Filled 2013-06-15: qty 11.5

## 2013-06-15 MED ORDER — ONDANSETRON 16 MG/50ML IVPB (CHCC)
16.0000 mg | Freq: Once | INTRAVENOUS | Status: AC
  Administered 2013-06-15: 16 mg via INTRAVENOUS

## 2013-06-15 MED ORDER — CARBOPLATIN CHEMO INJECTION 600 MG/60ML
469.5000 mg | Freq: Once | INTRAVENOUS | Status: AC
  Administered 2013-06-15: 470 mg via INTRAVENOUS
  Filled 2013-06-15: qty 47

## 2013-06-15 MED ORDER — SODIUM CHLORIDE 0.9 % IV SOLN
Freq: Once | INTRAVENOUS | Status: AC
Start: 2013-06-15 — End: 2013-06-15
  Administered 2013-06-15: 13:00:00 via INTRAVENOUS

## 2013-06-15 NOTE — Progress Notes (Signed)
[facility] [phone]   [phone]  OFFICE PROGRESS NOTE  No PCP Per Patient [address] [place] [address]  DIAGNOSIS: Extensive stage small cell lung cancer diagnosed in May of 2014   PRIOR THERAPY: None   CURRENT THERAPY: Systemic chemotherapy with carboplatin for AUC of 5 on day 1 and etoposide 120 mg/M2 on days 1, 2 and 3 with Neulasta support on day 4, status post 1 cycle. First cycle was started on 05/25/2013.  INTERVAL HISTORY: Jacob Webb 62 y.o. male returns to the clinic today for followup visit. The patient is feeling fine today with no specific complaints. He tolerated the first cycle of her systemic chemotherapy fairly well with no significant adverse effects except for alopecia. He denied having any significant fever or chills. He has no nausea or vomiting. He denied having any significant weight loss or night sweats. The patient has no chest pain, shortness of breath, cough or hemoptysis. History today to start cycle #2 of his chemotherapy.  ALLERGIES:  has No Known Allergies.  MEDICATIONS:  Current Outpatient Prescriptions  Medication Sig Dispense Refill  . HYDROcodone-acetaminophen (NORCO/VICODIN) 5-325 MG per tablet TAKE 1 TABLET BY MOUTH EVERY 6 HOURS AS NEEDED  15 tablet  0  . prochlorperazine (COMPAZINE) 10 MG tablet Take 1 tablet (10 mg total) by mouth every 6 (six) hours as needed.  30 tablet  1   No current facility-administered medications for this visit.    REVIEW OF SYSTEMS:  A comprehensive review of systems was negative except for: Constitutional: positive for fatigue   PHYSICAL EXAMINATION: General appearance: alert, cooperative, fatigued and no distress Head: Normocephalic, without obvious abnormality, atraumatic Neck: no adenopathy Lymph nodes: Cervical, supraclavicular, and axillary nodes normal. Resp: clear to auscultation bilaterally Back: symmetric, no curvature. ROM normal. No CVA  tenderness. Cardio: regular rate and rhythm, S1, S2 normal, no murmur, click, rub or gallop GI: soft, non-tender; bowel sounds normal; no masses,  no organomegaly Extremities: extremities normal, atraumatic, no cyanosis or edema Neurologic: Alert and oriented X 3, normal strength and tone. Normal symmetric reflexes. Normal coordination and gait  ECOG PERFORMANCE STATUS: 1 - Symptomatic but completely ambulatory  Blood pressure 125/76, pulse 91, temperature 97 F (36.1 C), temperature source Oral, resp. rate 18, height 5' 8.5" (1.74 m), weight 167 lb 6.4 oz (75.932 kg).  LABORATORY DATA: Lab Results  Component Value Date   WBC 11.2* 06/15/2013   HGB 13.3 06/15/2013   HCT 37.7* 06/15/2013   MCV 83.6 06/15/2013   PLT 231 06/15/2013      Chemistry      Component Value Date/Time   NA 140 06/08/2013 1009   NA 138 2013-06-04 1455   K 4.2 06/08/2013 1009   K 4.1 2013/06/04 1455   CL 105 06/08/2013 1009   CL 107 06/04/13 1455   CO2 25 06/08/2013 1009   BUN 20.9 06/08/2013 1009   BUN 21 06-04-2013 1455   CREATININE 1.2 06/08/2013 1009   CREATININE 1.00 06-04-13 1455      Component Value Date/Time   CALCIUM 9.4 06/08/2013 1009   ALKPHOS 141 06/08/2013 1009   AST 29 06/08/2013 1009   ALT 43 06/08/2013 1009   BILITOT <0.20 Repeated and Verified 06/08/2013 1009       RADIOGRAPHIC STUDIES: Mr Laqueta Jean WJ Contrast  04-Jun-2013   *RADIOLOGY REPORT*  Clinical Data: New diagnosis of small cell lung cancer.  MRI HEAD WITHOUT AND WITH CONTRAST  Technique:  Multiplanar, multiecho pulse sequences of the brain and surrounding structures were obtained according to standard protocol without and with intravenous contrast  Contrast: 15mL MULTIHANCE GADOBENATE DIMEGLUMINE 529 MG/ML IV SOLN  Comparison: None.  Findings: A focal enhancing lesion in the right corona radiata measures 8 x 8 x 7 mm.  There is restricted diffusion associated with this lesion, compatible with a metastasis of a small cell lung cancer.  The right  cerebellar lesion measures 5 mm maximally.  No additional lesions are evident at 1.5 Tesla.  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  Midline structures are otherwise within normal limits.  Flow is present in the major intracranial arteries.  The globes and orbits are intact.  Mild mucosal thickening is present in the ethmoid air cells, left frontal sinus, and maxillary sinuses bilaterally, left greater than right.  Fluid is present in the mastoid air cells bilaterally.  No obstructing nasopharyngeal lesion is evident.  IMPRESSION:  1.  Enhancing lesions within the right to frontal lobe corona radiata and right cerebellum are highly concerning for metastases of the small cell lung cancer with focal restricted diffusion. 2.  Mild generalized atrophy and minimal white matter disease is otherwise within normal limits for age. 3.  Mild diffuse sinus disease. 4.  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   Original Report Authenticated By: Marin Roberts, M.D.   Nm Pet Image Initial (pi) Skull Base To Thigh  05/21/2013   *RADIOLOGY REPORT*  Clinical Data:  Initial treatment strategy for staging of lung cancer.  Outside imaging demonstrating a 9.4 cm left suprahilar mass.  Biopsy demonstrating small cell carcinoma.  NUCLEAR MEDICINE WHOLE BODY PET CT  Technique:  Technique:  92 mCi F-18 FDG was injected intravenously. CT data was obtained and used for attenuation correction and anatomic localization only.  (This was not acquired as a diagnostic CT examination.) Additional exam technical data entered on technologist worksheet.  Comparison: 17  Findings: Neck: No abnormal hypermetabolism.  Chest:  Hypermetabolic left upper lobe/apical lung mass with direct extension into the left sided mediastinum.  At the apex, this measures 4.9 x 3.8 cm on image 70/series 2.  At the level of the AP window, the direct extension  towards the mediastinum measures 5.4 x 3.7 cm.  This entire process measures a S.U.V. max of 24.2.  Abdomen/Pelvis:  Hypermetabolic exophytic right-sided renal lesion which measures a S.U.V. max of 11.8, 1.4 cm,  and 32 HU on image 141/series 2.  Apparent hypermetabolism in the region of the sigmoid colon on image 219/series 2 is favored to be due to misregistration of the adjacent urinary bladder.  [place]/Extremities:  Osseous metastasis involving the left side of the vertebral body and left pedicle at T1.  This is CT occult.  It measures a S.U.V. max of 8.2 on image 53/series 2. A focus of hypermetabolism within the medial aspect of the left femoral neck is also CT occult.  This measures a S.U.V. max of 6.6 on image 234/series 2.  CT images performed for attenuation correction demonstrate mucous retention cyst or polyp in the right maxillary sinus.  Mucosal thickening in the left maxillary sinus.  LAD coronary artery atherosclerosis on image 97.  Moderate centrilobular emphysema.  Biapical pleural parenchymal scarring.  L5 bilateral pars defects.  IMPRESSION:  1.  Infiltrative left apical/upper lobe lung mass with direct extension into the mediastinum.  2.  Osseous metastasis. 3.  A right renal hypermetabolic lesion which is technically indeterminate on this unenhanced CT. This could represent a hemorrhagic cyst or a solid neoplasm.  Given hypermetabolism, suspicious for a renal cell carcinoma.  Pre and post contrast MRI (favored) or CT should be considered. 4.  Coronary artery atherosclerosis.   Original Report Authenticated By: Jeronimo Greaves, M.D.    ASSESSMENT AND PLAN: This is a very pleasant 62 years old white male with extensive stage small cell lung cancer currently undergoing systemic chemotherapy was carboplatin and etoposide status post 1 cycle. The patient is tolerating his treatment fairly well with no significant adverse effects. I recommended for him to proceed with cycle #2 today as scheduled.  The patient would come back for followup visit in 3 weeks with repeat CT scan of the chest, abdomen and pelvis for restaging of his disease. He was advised to call immediately if he has any concerning symptoms in the interval. He was given a refill of Vicodin for pain management.  All questions were answered. The patient knows to call the clinic with any problems, questions or concerns. We can certainly see the patient much sooner if necessary.  I spent 15 minutes counseling the patient face to face. The total time spent in the appointment was 25 minutes.

## 2013-06-15 NOTE — Telephone Encounter (Signed)
Per staff message and POF I have scheduled appts.  JMW  

## 2013-06-15 NOTE — Telephone Encounter (Signed)
gv and printed appt sched and avs for pt....MW added tx   °

## 2013-06-15 NOTE — Patient Instructions (Signed)
Continue chemotherapy today as scheduled.  Followup visit in 3 weeks with repeat CT scan of the chest, abdomen and pelvis. 

## 2013-06-15 NOTE — Patient Instructions (Signed)
[facility] Discharge Instructions for Patients Receiving Chemotherapy  Today you received the following chemotherapy agents :  Carboplatin,  Etoposide.  To help prevent nausea and vomiting after your treatment, we encourage you to take your nausea medication as instructed by your physician.   If you develop nausea and vomiting that is not controlled by your nausea medication, call the clinic.   BELOW ARE SYMPTOMS THAT SHOULD BE REPORTED IMMEDIATELY:  *FEVER GREATER THAN 100.5 F  *CHILLS WITH OR WITHOUT FEVER  NAUSEA AND VOMITING THAT IS NOT CONTROLLED WITH YOUR NAUSEA MEDICATION  *UNUSUAL SHORTNESS OF BREATH  *UNUSUAL BRUISING OR BLEEDING  TENDERNESS IN MOUTH AND THROAT WITH OR WITHOUT PRESENCE OF ULCERS  *URINARY PROBLEMS  *BOWEL PROBLEMS  UNUSUAL RASH Items with * indicate a potential emergency and should be followed up as soon as possible.  Feel free to call the clinic you have any questions or concerns. The clinic phone number is [phone].    

## 2013-06-16 ENCOUNTER — Ambulatory Visit (HOSPITAL_BASED_OUTPATIENT_CLINIC_OR_DEPARTMENT_OTHER): Payer: Medicaid Other

## 2013-06-16 ENCOUNTER — Ambulatory Visit: Payer: Self-pay | Admitting: Nutrition

## 2013-06-16 VITALS — BP 122/67 | HR 100 | Temp 96.9°F | Resp 20

## 2013-06-16 DIAGNOSIS — C3490 Malignant neoplasm of unspecified part of unspecified bronchus or lung: Secondary | ICD-10-CM

## 2013-06-16 DIAGNOSIS — Z5111 Encounter for antineoplastic chemotherapy: Secondary | ICD-10-CM

## 2013-06-16 DIAGNOSIS — C3410 Malignant neoplasm of upper lobe, unspecified bronchus or lung: Secondary | ICD-10-CM

## 2013-06-16 MED ORDER — SODIUM CHLORIDE 0.9 % IV SOLN
Freq: Once | INTRAVENOUS | Status: AC
  Administered 2013-06-16: 13:00:00 via INTRAVENOUS

## 2013-06-16 MED ORDER — SODIUM CHLORIDE 0.9 % IV SOLN
120.0000 mg/m2 | Freq: Once | INTRAVENOUS | Status: AC
  Administered 2013-06-16: 230 mg via INTRAVENOUS
  Filled 2013-06-16: qty 11.5

## 2013-06-16 MED ORDER — ONDANSETRON 8 MG/50ML IVPB (CHCC)
8.0000 mg | Freq: Once | INTRAVENOUS | Status: AC
  Administered 2013-06-16: 8 mg via INTRAVENOUS

## 2013-06-16 MED ORDER — DEXAMETHASONE SODIUM PHOSPHATE 10 MG/ML IJ SOLN
10.0000 mg | Freq: Once | INTRAMUSCULAR | Status: AC
  Administered 2013-06-16: 10 mg via INTRAVENOUS

## 2013-06-16 NOTE — Progress Notes (Signed)
  Radiation Oncology         [phone] ________________________________  Name: Jacob Webb MRN: [id number]  Date: 06/05/2013  DOB: [birth year]  SIMULATION AND TREATMENT PLANNING NOTE  DIAGNOSIS:  Small cell lung cancer, extensive stage  NARRATIVE:  The patient was brought to the CT Simulation planning suite.  He will be treated to 2 separate areas : Whole brain radiotherapy as well as thoracic radiotherapy. Identity was confirmed.  All relevant records and images related to the planned course of therapy were reviewed.   Written consent to proceed with treatment was confirmed which was freely given after reviewing the details related to the planned course of therapy had been reviewed with the patient.  Then, the patient was set-up in a stable reproducible  supine position for radiation therapy.  CT images were obtained.  Surface markings were placed.  A wing board device will be used for the patient's treatment to the chest. Also in a supine position, a customized thermoplastic head chest was constructed to help with the patient immobilization during his course of whole brain radiotherapy. A CT scan was also obtained with the patient in this position.  The CT images were loaded into the planning software.  Then the target and avoidance structures were contoured.  Treatment planning then occurred.  The radiation prescription was entered and confirmed.  A total of 5 complex treatment devices were fabricated which relate to the designed radiation treatment fields: 2 oblique fields for whole brain radiotherapy, and 3 fields for thoracic radiotherapy. Each of these customized fields/ complex treatment devices will be used on a daily basis during the radiation course. I have requested : Isodose Plan for whole brain radiotherapy, and a 3-D conformal treatment plan for thoracic radiotherapy. The normal structures including the spinal cord, lungs, and esophagus in addition to the gross tumor volume has been  contoured for treatment to the chest. Dose volume histograms of each of these structures will be reviewed as part of the treatment planning process.  PLAN:  The patient will receive 30 Gy in 10 fractions to both of these treatment sites. They will be given concurrently over a two-week period.  ________________________________   Radene Gunning, MD, PhD

## 2013-06-16 NOTE — Progress Notes (Signed)
Patient reports he is eating well.  His weight is stable at 167.4 pounds documented June 30.  He continues to drink Ensure Plus when needed.  He voices no concerns regarding nutrition.  Nutrition diagnosis: Unintended weight loss has improved.  Intervention: I have again educated patient to continue small frequent meals and supplement his intake with Ensure Plus as needed to promote weight maintenance.  Teach back method used.  Monitoring, evaluation, goals: Patient is tolerating adequate oral intake to promote weight stabilization.  Next visit: Monday, July 21, during chemotherapy.

## 2013-06-16 NOTE — Patient Instructions (Addendum)
[facility] Discharge Instructions for Patients Receiving Chemotherapy  Today you received the following chemotherapy agents Etoposide.   To help prevent nausea and vomiting after your treatment, we encourage you to take your nausea medication as directed.    If you develop nausea and vomiting that is not controlled by your nausea medication, call the clinic.   BELOW ARE SYMPTOMS THAT SHOULD BE REPORTED IMMEDIATELY:  *FEVER GREATER THAN 100.5 F  *CHILLS WITH OR WITHOUT FEVER  NAUSEA AND VOMITING THAT IS NOT CONTROLLED WITH YOUR NAUSEA MEDICATION  *UNUSUAL SHORTNESS OF BREATH  *UNUSUAL BRUISING OR BLEEDING  TENDERNESS IN MOUTH AND THROAT WITH OR WITHOUT PRESENCE OF ULCERS  *URINARY PROBLEMS  *BOWEL PROBLEMS  UNUSUAL RASH Items with * indicate a potential emergency and should be followed up as soon as possible.  Feel free to call the clinic you have any questions or concerns. The clinic phone number is [phone].    

## 2013-06-17 ENCOUNTER — Ambulatory Visit (HOSPITAL_BASED_OUTPATIENT_CLINIC_OR_DEPARTMENT_OTHER): Payer: Medicaid Other

## 2013-06-17 VITALS — BP 127/72 | HR 86 | Temp 97.0°F | Resp 20

## 2013-06-17 DIAGNOSIS — Z5111 Encounter for antineoplastic chemotherapy: Secondary | ICD-10-CM

## 2013-06-17 DIAGNOSIS — C3410 Malignant neoplasm of upper lobe, unspecified bronchus or lung: Secondary | ICD-10-CM

## 2013-06-17 DIAGNOSIS — C3490 Malignant neoplasm of unspecified part of unspecified bronchus or lung: Secondary | ICD-10-CM

## 2013-06-17 MED ORDER — ONDANSETRON 8 MG/50ML IVPB (CHCC)
8.0000 mg | Freq: Once | INTRAVENOUS | Status: AC
  Administered 2013-06-17: 8 mg via INTRAVENOUS

## 2013-06-17 MED ORDER — SODIUM CHLORIDE 0.9 % IV SOLN
Freq: Once | INTRAVENOUS | Status: AC
  Administered 2013-06-17: 13:00:00 via INTRAVENOUS

## 2013-06-17 MED ORDER — DEXAMETHASONE SODIUM PHOSPHATE 10 MG/ML IJ SOLN
10.0000 mg | Freq: Once | INTRAMUSCULAR | Status: AC
  Administered 2013-06-17: 10 mg via INTRAVENOUS

## 2013-06-17 MED ORDER — SODIUM CHLORIDE 0.9 % IV SOLN
120.0000 mg/m2 | Freq: Once | INTRAVENOUS | Status: AC
  Administered 2013-06-17: 230 mg via INTRAVENOUS
  Filled 2013-06-17: qty 11.5

## 2013-06-17 NOTE — Patient Instructions (Signed)
[facility] Discharge Instructions for Patients Receiving Chemotherapy  Today you received the following chemotherapy agents :  Etoposide.  To help prevent nausea and vomiting after your treatment, we encourage you to take your nausea medication as instructed by your physician.   If you develop nausea and vomiting that is not controlled by your nausea medication, call the clinic.   BELOW ARE SYMPTOMS THAT SHOULD BE REPORTED IMMEDIATELY:  *FEVER GREATER THAN 100.5 F  *CHILLS WITH OR WITHOUT FEVER  NAUSEA AND VOMITING THAT IS NOT CONTROLLED WITH YOUR NAUSEA MEDICATION  *UNUSUAL SHORTNESS OF BREATH  *UNUSUAL BRUISING OR BLEEDING  TENDERNESS IN MOUTH AND THROAT WITH OR WITHOUT PRESENCE OF ULCERS  *URINARY PROBLEMS  *BOWEL PROBLEMS  UNUSUAL RASH Items with * indicate a potential emergency and should be followed up as soon as possible.  Feel free to call the clinic you have any questions or concerns. The clinic phone number is [phone].    

## 2013-06-18 ENCOUNTER — Ambulatory Visit (HOSPITAL_BASED_OUTPATIENT_CLINIC_OR_DEPARTMENT_OTHER): Payer: Self-pay

## 2013-06-18 VITALS — BP 107/62 | HR 84 | Temp 98.2°F

## 2013-06-18 DIAGNOSIS — Z5189 Encounter for other specified aftercare: Secondary | ICD-10-CM

## 2013-06-18 DIAGNOSIS — C3410 Malignant neoplasm of upper lobe, unspecified bronchus or lung: Secondary | ICD-10-CM

## 2013-06-18 DIAGNOSIS — C3490 Malignant neoplasm of unspecified part of unspecified bronchus or lung: Secondary | ICD-10-CM

## 2013-06-18 MED ORDER — PEGFILGRASTIM INJECTION 6 MG/0.6ML
6.0000 mg | Freq: Once | SUBCUTANEOUS | Status: AC
  Administered 2013-06-18: 6 mg via SUBCUTANEOUS
  Filled 2013-06-18: qty 0.6

## 2013-06-19 ENCOUNTER — Other Ambulatory Visit: Payer: Self-pay | Admitting: Internal Medicine

## 2013-06-22 ENCOUNTER — Other Ambulatory Visit (HOSPITAL_BASED_OUTPATIENT_CLINIC_OR_DEPARTMENT_OTHER): Payer: Self-pay | Admitting: Lab

## 2013-06-22 ENCOUNTER — Other Ambulatory Visit: Payer: Self-pay | Admitting: Internal Medicine

## 2013-06-22 DIAGNOSIS — C3490 Malignant neoplasm of unspecified part of unspecified bronchus or lung: Secondary | ICD-10-CM

## 2013-06-22 LAB — CBC WITH DIFFERENTIAL/PLATELET
Basophils Absolute: 0.1 10*3/uL (ref 0.0–0.1)
EOS%: 0.2 % (ref 0.0–7.0)
Eosinophils Absolute: 0 10*3/uL (ref 0.0–0.5)
HCT: 35.6 % — ABNORMAL LOW (ref 38.4–49.9)
HGB: 12.3 g/dL — ABNORMAL LOW (ref 13.0–17.1)
MCH: 29.4 pg (ref 27.2–33.4)
MONO#: 1.3 10*3/uL — ABNORMAL HIGH (ref 0.1–0.9)
NEUT%: 64.8 % (ref 39.0–75.0)
lymph#: 1.9 10*3/uL (ref 0.9–3.3)

## 2013-06-22 LAB — COMPREHENSIVE METABOLIC PANEL
BUN: 21 mg/dL (ref 6–23)
CO2: 31 mEq/L (ref 19–32)
Calcium: 10 mg/dL (ref 8.4–10.5)
Chloride: 97 mEq/L (ref 96–112)
Creatinine, Ser: 0.92 mg/dL (ref 0.50–1.35)

## 2013-06-22 NOTE — Telephone Encounter (Signed)
Compazine refill done.

## 2013-06-25 ENCOUNTER — Other Ambulatory Visit: Payer: Self-pay | Admitting: Internal Medicine

## 2013-06-25 ENCOUNTER — Encounter: Payer: Self-pay | Admitting: *Deleted

## 2013-06-26 ENCOUNTER — Ambulatory Visit
Admission: RE | Admit: 2013-06-26 | Discharge: 2013-06-26 | Disposition: A | Discharge status: Home / Self Care | Payer: Medicaid Other | Source: Ambulatory Visit | Attending: Radiation Oncology | Admitting: Radiation Oncology

## 2013-06-26 DIAGNOSIS — C3490 Malignant neoplasm of unspecified part of unspecified bronchus or lung: Secondary | ICD-10-CM

## 2013-06-29 ENCOUNTER — Other Ambulatory Visit (HOSPITAL_BASED_OUTPATIENT_CLINIC_OR_DEPARTMENT_OTHER): Payer: Self-pay | Admitting: Lab

## 2013-06-29 ENCOUNTER — Telehealth: Payer: Self-pay | Admitting: Internal Medicine

## 2013-06-29 ENCOUNTER — Ambulatory Visit
Admission: RE | Admit: 2013-06-29 | Discharge: 2013-06-29 | Disposition: A | Discharge status: Home / Self Care | Payer: Medicaid Other | Source: Ambulatory Visit | Attending: Radiation Oncology | Admitting: Radiation Oncology

## 2013-06-29 DIAGNOSIS — C3490 Malignant neoplasm of unspecified part of unspecified bronchus or lung: Secondary | ICD-10-CM

## 2013-06-29 LAB — CBC WITH DIFFERENTIAL/PLATELET
Basophils Absolute: 0.2 10*3/uL — ABNORMAL HIGH (ref 0.0–0.1)
HCT: 39.7 % (ref 38.4–49.9)
HGB: 13.4 g/dL (ref 13.0–17.1)
MONO#: 1.6 10*3/uL — ABNORMAL HIGH (ref 0.1–0.9)
NEUT%: 72.7 % (ref 39.0–75.0)
WBC: 20.8 10*3/uL — ABNORMAL HIGH (ref 4.0–10.3)
lymph#: 3.9 10*3/uL — ABNORMAL HIGH (ref 0.9–3.3)

## 2013-06-29 LAB — COMPREHENSIVE METABOLIC PANEL (CC13)
AST: 22 U/L (ref 5–34)
Albumin: 3.7 g/dL (ref 3.5–5.0)
BUN: 12.1 mg/dL (ref 7.0–26.0)
Calcium: 9.6 mg/dL (ref 8.4–10.4)
Chloride: 106 mEq/L (ref 98–109)
Glucose: 87 mg/dl (ref 70–140)
Potassium: 4.4 mEq/L (ref 3.5–5.1)

## 2013-06-29 NOTE — Progress Notes (Signed)
  Radiation Oncology         [phone] ________________________________  Name: Arvid Marengo MRN: [id number]  Date: 06/26/2013  DOB: [birth year]  Simulation Verification Note  Status: outpatient  NARRATIVE: The patient was brought to the treatment unit and placed in the planned treatment position. The clinical setup was verified. Then port films were obtained and uploaded to the radiation oncology medical record software.  The treatment beams were carefully compared against the planned radiation fields. The position location and shape of the radiation fields was reviewed. They targeted volume of tissue appears to be appropriately covered by the radiation beams. Organs at risk appear to be excluded as planned.  Based on my personal review, I approved the simulation verification. The patient's treatment will proceed as planned.  ------------------------------------------------  Artist Pais Kathrynn Running, M.D.

## 2013-06-29 NOTE — Telephone Encounter (Signed)
Per 7/10 pof moved f/u and chemo from 7/21 to 7/28. S/w pt today he is aware. Pt given next appt for 7/21 and will get new schedule when he comes in. Also confirmed 7/21 ct appt. Pt's connection was bad so I also lmonvm for him re above appts and reminded pt to get schedule when he comes in on 97/21.

## 2013-06-30 ENCOUNTER — Ambulatory Visit
Admission: RE | Admit: 2013-06-30 | Discharge: 2013-06-30 | Disposition: A | Discharge status: Home / Self Care | Payer: Medicaid Other | Source: Ambulatory Visit | Attending: Radiation Oncology | Admitting: Radiation Oncology

## 2013-07-01 ENCOUNTER — Encounter: Payer: Self-pay | Admitting: *Deleted

## 2013-07-01 ENCOUNTER — Ambulatory Visit
Admission: RE | Admit: 2013-07-01 | Discharge: 2013-07-01 | Disposition: A | Discharge status: Home / Self Care | Payer: Medicaid Other | Source: Ambulatory Visit | Attending: Radiation Oncology | Admitting: Radiation Oncology

## 2013-07-01 NOTE — Progress Notes (Signed)
   Department of Radiation Oncology  Phone:  [phone] Fax:        [phone]  Weekly Treatment Note    Name: Jacob Webb Date: 07/01/2013 MRN: [id number] DOB: [birth year]   Current dose: 9 Gy  Current fraction: 3   MEDICATIONS: Current Outpatient Prescriptions  Medication Sig Dispense Refill  . HYDROcodone-acetaminophen (NORCO/VICODIN) 5-325 MG per tablet TAKE 1 TABLET BY MOUTH EVERY 6 HOURS AS NEEDED  30 tablet  0  . prochlorperazine (COMPAZINE) 10 MG tablet TAKE 1 TABLET BY MOUTH EVERY 6 HOURS AS NEEDED FOR VOMITING  30 tablet  0   No current facility-administered medications for this encounter.     ALLERGIES: Review of patient's allergies indicates no known allergies.   LABORATORY DATA:  Lab Results  Component Value Date   WBC 20.8 verified* 06/29/2013   HGB 13.4 06/29/2013   HCT 39.7 06/29/2013   MCV 87.4 06/29/2013   PLT 88 verified* 06/29/2013   Lab Results  Component Value Date   NA 141 06/29/2013   K 4.4 06/29/2013   CL 97 06/22/2013   CO2 25 06/29/2013   Lab Results  Component Value Date   ALT 30 06/29/2013   AST 22 06/29/2013   ALKPHOS 149 06/29/2013   BILITOT <0.20 Repeated and Verified 06/29/2013     NARRATIVE: Jacob Webb was seen today for weekly treatment management. The chart was checked and the patient's films were reviewed. The patient is doing well in his first week of treatment. No real complaints of far.  PHYSICAL EXAMINATION: height is 5' 8.5" (1.74 m) and weight is 163 lb 11.2 oz (74.254 kg). His temperature is 97.8 F (36.6 C). His blood pressure is 111/62 and his pulse is 88. His oxygen saturation is 97%.        ASSESSMENT: The patient is doing satisfactorily with treatment.  PLAN: We will continue with the patient's radiation treatment as planned.

## 2013-07-01 NOTE — Progress Notes (Signed)
Clinical Social Work met with Mr. Jacob Webb in radiation treatment area.  Mr. Jacob Webb states chemoradiation is going well, but he is starting to experience side effects such as fatigue.  The patient reported issues with transportation.  CSW collaborated with Oretha Milch, financial advocate, to provide $50 gas card.  CSW also followed up on patient's application for SSDI.  Kathrin Penner, MSW, LCSW Clinical Social Worker [employer] [phone]

## 2013-07-01 NOTE — Progress Notes (Signed)
Jacob Webb here for weekly under treat visit.  He has had 3 fractions to his brain and left lung.  He does have pain in his midsternal region that he is rating at a 5/10.  He is taking norco which helps.  He denies dizziness, nausea and blurred vision.  He has noticed a change in his balance and does feel dizzy sometimes when standing.  He does have a cough and is bringing up white sputum.  He was given the radiation therapy and you book and discussed the potential side effects of radiation including fatigue, hair loss, nausea, skin changes and throat changes.  He was advised to contact nursing with any questions or concerns.

## 2013-07-02 ENCOUNTER — Ambulatory Visit
Admission: RE | Admit: 2013-07-02 | Discharge: 2013-07-02 | Disposition: A | Discharge status: Home / Self Care | Payer: Medicaid Other | Source: Ambulatory Visit | Attending: Radiation Oncology | Admitting: Radiation Oncology

## 2013-07-03 ENCOUNTER — Ambulatory Visit
Admission: RE | Admit: 2013-07-03 | Discharge: 2013-07-03 | Disposition: A | Discharge status: Home / Self Care | Payer: Medicaid Other | Source: Ambulatory Visit | Attending: Radiation Oncology | Admitting: Radiation Oncology

## 2013-07-03 MED ORDER — ONDANSETRON HCL 4 MG PO TABS
8.0000 mg | ORAL_TABLET | Freq: Once | ORAL | Status: AC
  Administered 2013-07-03: 8 mg via ORAL
  Filled 2013-07-03: qty 2

## 2013-07-03 MED ORDER — ONDANSETRON HCL 8 MG PO TABS: 8.0000 mg | ORAL_TABLET | Freq: Two times a day (BID) | ORAL | Status: DC | PRN

## 2013-07-03 NOTE — Progress Notes (Addendum)
Jacob Webb in the clinic.  He had treamtent today and the therapists found him sleeping in the dressing room a half and hour after the treatment.  He told the therapists he needed to rest before going home.  He did drive himself. Orthostatic vitals were done: bp sitting 115/69 and standing 95/67.  He states he feels nauseous when he stands.   He said the nausea started last night.  He did vomit twice.  He has been taking compazine with the last dose 3 hours ago.  He said it helps a little bit.  He does have some discomfort in his mid chest.  He is very sleeping and is nodding off in the chair.  He is now resting on the exam table.  Late entry:  Jacob seems much more alert now.  He has been given 8 mg of zofran po for the nausea.   Rechecked vitals: bp 107/49, hr 83.  [name] states that he feels much better.  Checked to see if [facility] has his script for zofran.  Per Lanora Manis, they are working on it.  [name] is ok to drive per Dr. Mitzi Hansen.  He was escorted to the elevator.

## 2013-07-03 NOTE — Progress Notes (Signed)
   Department of Radiation Oncology  Phone:  [phone] Fax:        [phone]  Weekly Treatment Note    Name: Jacob Webb Date: 07/03/2013 MRN: [id number] DOB: 1951-07-06   Current dose: 15 Gy  Current fraction:5   MEDICATIONS: Current Outpatient Prescriptions  Medication Sig Dispense Refill  . HYDROcodone-acetaminophen (NORCO/VICODIN) 5-325 MG per tablet TAKE 1 TABLET BY MOUTH EVERY 6 HOURS AS NEEDED  30 tablet  0  . ondansetron (ZOFRAN) 8 MG tablet Take 1 tablet (8 mg total) by mouth every 12 (twelve) hours as needed for nausea.  30 tablet  0  . prochlorperazine (COMPAZINE) 10 MG tablet TAKE 1 TABLET BY MOUTH EVERY 6 HOURS AS NEEDED FOR VOMITING  30 tablet  0   Current Facility-Administered Medications  Medication Dose Route Frequency Provider Last Rate Last Dose  . ondansetron (ZOFRAN) tablet 8 mg  8 mg Oral Once Jonna Coup, MD         ALLERGIES: Review of patient's allergies indicates no known allergies.   LABORATORY DATA:  Lab Results  Component Value Date   WBC 20.8 verified* 06/29/2013   HGB 13.4 06/29/2013   HCT 39.7 06/29/2013   MCV 87.4 06/29/2013   PLT 88 verified* 06/29/2013   Lab Results  Component Value Date   NA 141 06/29/2013   K 4.4 06/29/2013   CL 97 06/22/2013   CO2 25 06/29/2013   Lab Results  Component Value Date   ALT 30 06/29/2013   AST 22 06/29/2013   ALKPHOS 149 06/29/2013   BILITOT <0.20 Repeated and Verified 06/29/2013     NARRATIVE: Jacob Webb was seen today for weekly treatment management. The chart was checked and the patient's films were reviewed. The patient came around today after treatment. He has been feeling sleepy this afternoon in clinic and primarily complains of nausea over the last couple of days. He took Compazine prior to coming to clinic. He states that Compazine has worked okay until today when he really did not seem to help his nausea months.  PHYSICAL EXAMINATION: temperature is 98.5 F (36.9 C). His blood  pressure is 95/67 and his pulse is 95. His oxygen saturation is 100%.      orthostatics were taken and his blood pressure did drop some upon standing  ASSESSMENT: The patient is doing satisfactorily with treatment.  PLAN: We will continue with the patient's radiation treatment as planned. I encourage the patient to take in more fluids and he was given Zofran in clinic to help with nausea immediately. I have also given him a prescription for Zofran which he can hopefully get filled and add to Compazine which he is taking right now. We discussed possible reasons to go to the emergency room this weekend such as becoming more somnolent, and he will let us know next week if this regimen is not working.

## 2013-07-06 ENCOUNTER — Encounter (HOSPITAL_COMMUNITY): Payer: Self-pay

## 2013-07-06 ENCOUNTER — Other Ambulatory Visit: Payer: Self-pay | Admitting: Lab

## 2013-07-06 ENCOUNTER — Ambulatory Visit
Admission: RE | Admit: 2013-07-06 | Discharge: 2013-07-06 | Disposition: A | Discharge status: Home / Self Care | Payer: Medicaid Other | Source: Ambulatory Visit | Attending: Radiation Oncology | Admitting: Radiation Oncology

## 2013-07-06 ENCOUNTER — Ambulatory Visit: Payer: Self-pay

## 2013-07-06 ENCOUNTER — Ambulatory Visit: Payer: Self-pay | Admitting: Internal Medicine

## 2013-07-06 ENCOUNTER — Other Ambulatory Visit (HOSPITAL_BASED_OUTPATIENT_CLINIC_OR_DEPARTMENT_OTHER): Payer: Self-pay | Admitting: Lab

## 2013-07-06 ENCOUNTER — Ambulatory Visit (HOSPITAL_COMMUNITY)
Admission: RE | Admit: 2013-07-06 | Discharge: 2013-07-06 | Disposition: A | Discharge status: Home / Self Care | Payer: Medicaid Other | Source: Ambulatory Visit | Attending: Internal Medicine | Admitting: Internal Medicine

## 2013-07-06 ENCOUNTER — Encounter: Payer: Self-pay | Admitting: Nutrition

## 2013-07-06 DIAGNOSIS — C7900 Secondary malignant neoplasm of unspecified kidney and renal pelvis: Secondary | ICD-10-CM

## 2013-07-06 DIAGNOSIS — N289 Disorder of kidney and ureter, unspecified: Secondary | ICD-10-CM | POA: Insufficient documentation

## 2013-07-06 DIAGNOSIS — C8583 Other specified types of non-Hodgkin lymphoma, intra-abdominal lymph nodes: Secondary | ICD-10-CM

## 2013-07-06 DIAGNOSIS — I708 Atherosclerosis of other arteries: Secondary | ICD-10-CM | POA: Insufficient documentation

## 2013-07-06 DIAGNOSIS — I700 Atherosclerosis of aorta: Secondary | ICD-10-CM | POA: Insufficient documentation

## 2013-07-06 DIAGNOSIS — C3490 Malignant neoplasm of unspecified part of unspecified bronchus or lung: Secondary | ICD-10-CM | POA: Insufficient documentation

## 2013-07-06 LAB — COMPREHENSIVE METABOLIC PANEL (CC13)
AST: 20 U/L (ref 5–34)
Albumin: 3.6 g/dL (ref 3.5–5.0)
BUN: 19.9 mg/dL (ref 7.0–26.0)
CO2: 26 mEq/L (ref 22–29)
Calcium: 9 mg/dL (ref 8.4–10.4)
Chloride: 101 mEq/L (ref 98–109)
Creatinine: 0.9 mg/dL (ref 0.7–1.3)
Glucose: 75 mg/dl (ref 70–140)

## 2013-07-06 LAB — CBC WITH DIFFERENTIAL/PLATELET
Basophils Absolute: 0 10*3/uL (ref 0.0–0.1)
EOS%: 0.3 % (ref 0.0–7.0)
Eosinophils Absolute: 0 10*3/uL (ref 0.0–0.5)
HCT: 35.2 % — ABNORMAL LOW (ref 38.4–49.9)
HGB: 11.8 g/dL — ABNORMAL LOW (ref 13.0–17.1)
MCH: 29.2 pg (ref 27.2–33.4)
NEUT#: 8.6 10*3/uL — ABNORMAL HIGH (ref 1.5–6.5)
NEUT%: 80.1 % — ABNORMAL HIGH (ref 39.0–75.0)
lymph#: 1.1 10*3/uL (ref 0.9–3.3)

## 2013-07-06 MED ORDER — IOHEXOL 300 MG/ML  SOLN
100.0000 mL | Freq: Once | INTRAMUSCULAR | Status: AC | PRN
  Administered 2013-07-06: 100 mL via INTRAVENOUS

## 2013-07-07 ENCOUNTER — Ambulatory Visit: Payer: Self-pay

## 2013-07-07 ENCOUNTER — Ambulatory Visit
Admission: RE | Admit: 2013-07-07 | Discharge: 2013-07-07 | Disposition: A | Discharge status: Home / Self Care | Payer: Medicaid Other | Source: Ambulatory Visit | Attending: Radiation Oncology | Admitting: Radiation Oncology

## 2013-07-08 ENCOUNTER — Ambulatory Visit: Payer: Self-pay

## 2013-07-08 ENCOUNTER — Ambulatory Visit
Admission: RE | Admit: 2013-07-08 | Discharge: 2013-07-08 | Disposition: A | Discharge status: Home / Self Care | Payer: Medicaid Other | Source: Ambulatory Visit | Attending: Radiation Oncology | Admitting: Radiation Oncology

## 2013-07-09 ENCOUNTER — Ambulatory Visit: Payer: Self-pay

## 2013-07-09 ENCOUNTER — Ambulatory Visit
Admission: RE | Admit: 2013-07-09 | Discharge: 2013-07-09 | Disposition: A | Discharge status: Home / Self Care | Payer: Medicaid Other | Source: Ambulatory Visit | Attending: Radiation Oncology | Admitting: Radiation Oncology

## 2013-07-10 ENCOUNTER — Ambulatory Visit
Admission: RE | Admit: 2013-07-10 | Discharge: 2013-07-10 | Disposition: A | Discharge status: Home / Self Care | Payer: Medicaid Other | Source: Ambulatory Visit | Attending: Radiation Oncology | Admitting: Radiation Oncology

## 2013-07-10 ENCOUNTER — Encounter: Payer: Self-pay | Admitting: Radiation Oncology

## 2013-07-10 VITALS — BP 91/67 | HR 98 | Temp 98.5°F | Ht 68.5 in | Wt 159.1 lb

## 2013-07-10 DIAGNOSIS — C3490 Malignant neoplasm of unspecified part of unspecified bronchus or lung: Secondary | ICD-10-CM

## 2013-07-10 MED ORDER — HYDROCODONE-ACETAMINOPHEN 5-325 MG PO TABS: ORAL_TABLET | ORAL | Status: DC

## 2013-07-10 NOTE — Progress Notes (Signed)
Jacob Webb here for final treatment visit.  He has had 10 fractions to his brain and left lung.  He does have pain in his shoulders and upper back that he is rating at a 7/10.  He is requesting a refill of his pain medication.  Today he states that "he just doesn't feel good.  I have a hard time holding my head up."  When asked why he states that he is tired and has nausea.  He reports feeling like this for the last 3-4 days.  He has lost 4 lbs since 7/16.  Orthostatic vitals were done: bp sitting 103/68 and standing 91/67.  He does have dizziness sometimes with standing.  He denies changes in vision.   He reports some balance issues.  His skin is intact.  He does have a cough.

## 2013-07-10 NOTE — Progress Notes (Signed)
   Department of Radiation Oncology  Phone:  [phone] Fax:        [phone]  Weekly Treatment Note    Name: Jacob Webb Date: 07/10/2013 MRN: [id number] DOB: 01-27-1951   Current dose: 30 Gy  Current fraction: 10   MEDICATIONS: Current Outpatient Prescriptions  Medication Sig Dispense Refill  . HYDROcodone-acetaminophen (NORCO/VICODIN) 5-325 MG per tablet TAKE 1 TABLET BY MOUTH EVERY 6 HOURS AS NEEDED  40 tablet  0  . ondansetron (ZOFRAN) 8 MG tablet Take 1 tablet (8 mg total) by mouth every 12 (twelve) hours as needed for nausea.  30 tablet  0  . prochlorperazine (COMPAZINE) 10 MG tablet TAKE 1 TABLET BY MOUTH EVERY 6 HOURS AS NEEDED FOR VOMITING  30 tablet  0   No current facility-administered medications for this encounter.     ALLERGIES: Review of patient's allergies indicates no known allergies.   LABORATORY DATA:  Lab Results  Component Value Date   WBC 10.7* 07/06/2013   HGB 11.8* 07/06/2013   HCT 35.2* 07/06/2013   MCV 86.9 07/06/2013   PLT 209 07/06/2013   Lab Results  Component Value Date   NA 136 07/06/2013   K 3.6 07/06/2013   CL 97 06/22/2013   CO2 26 07/06/2013   Lab Results  Component Value Date   ALT 22 07/06/2013   AST 20 07/06/2013   ALKPHOS 106 07/06/2013   BILITOT 0.43 07/06/2013     NARRATIVE: Jacob Webb was seen today for weekly treatment management. The chart was checked and the patient's films were reviewed. The patient completed his final fraction today. He is not feeling great today. He has had some nausea. The patient does have anti-emetic medication for this. He does request a refill on his pain medication which was given to him.  PHYSICAL EXAMINATION: height is 5' 8.5" (1.74 m) and weight is 159 lb 1.6 oz (72.167 kg). His temperature is 98.5 F (36.9 C). His blood pressure is 91/67 and his pulse is 98. His oxygen saturation is 100%.        ASSESSMENT: The patient did satisfactorily with treatment.  PLAN: We discussed that the  patient needs to increase his fluid intake over the next several days. This should be possible and the patient should start to feel better now that he has completed his course of radiation. We will see him back in one month for ongoing followup.

## 2013-07-13 ENCOUNTER — Other Ambulatory Visit (HOSPITAL_BASED_OUTPATIENT_CLINIC_OR_DEPARTMENT_OTHER): Payer: Self-pay

## 2013-07-13 ENCOUNTER — Encounter: Payer: Self-pay | Admitting: Nutrition

## 2013-07-13 ENCOUNTER — Other Ambulatory Visit: Payer: Self-pay | Admitting: *Deleted

## 2013-07-13 ENCOUNTER — Ambulatory Visit (HOSPITAL_BASED_OUTPATIENT_CLINIC_OR_DEPARTMENT_OTHER): Payer: Self-pay | Admitting: Internal Medicine

## 2013-07-13 ENCOUNTER — Encounter: Payer: Self-pay | Admitting: Internal Medicine

## 2013-07-13 ENCOUNTER — Ambulatory Visit: Payer: Self-pay

## 2013-07-13 DIAGNOSIS — C3410 Malignant neoplasm of upper lobe, unspecified bronchus or lung: Secondary | ICD-10-CM

## 2013-07-13 DIAGNOSIS — C3490 Malignant neoplasm of unspecified part of unspecified bronchus or lung: Secondary | ICD-10-CM

## 2013-07-13 DIAGNOSIS — F17200 Nicotine dependence, unspecified, uncomplicated: Secondary | ICD-10-CM

## 2013-07-13 DIAGNOSIS — R222 Localized swelling, mass and lump, trunk: Secondary | ICD-10-CM

## 2013-07-13 DIAGNOSIS — C7949 Secondary malignant neoplasm of other parts of nervous system: Secondary | ICD-10-CM

## 2013-07-13 LAB — CBC WITH DIFFERENTIAL/PLATELET
Basophils Absolute: 0.1 10*3/uL (ref 0.0–0.1)
Eosinophils Absolute: 0.1 10*3/uL (ref 0.0–0.5)
HCT: 41.8 % (ref 38.4–49.9)
HGB: 14.3 g/dL (ref 13.0–17.1)
MONO#: 1.9 10*3/uL — ABNORMAL HIGH (ref 0.1–0.9)
NEUT#: 8.5 10*3/uL — ABNORMAL HIGH (ref 1.5–6.5)
NEUT%: 76 % — ABNORMAL HIGH (ref 39.0–75.0)
RDW: 16.6 % — ABNORMAL HIGH (ref 11.0–14.6)
WBC: 11.3 10*3/uL — ABNORMAL HIGH (ref 4.0–10.3)
lymph#: 0.7 10*3/uL — ABNORMAL LOW (ref 0.9–3.3)

## 2013-07-13 MED ORDER — METHYLPREDNISOLONE (PAK) 4 MG PO TABS: ORAL_TABLET | ORAL | Status: DC

## 2013-07-13 NOTE — Patient Instructions (Signed)
The scan showed significant improvement in her disease. Continue chemotherapy with the same regimen. Cycle #3 to start next week.

## 2013-07-13 NOTE — Progress Notes (Signed)
[facility] Telephone:([phone]   [phone]  OFFICE PROGRESS NOTE  No PCP Per Patient [address] [place] [address]  DIAGNOSIS AND STAGE: Extensive stage small cell lung cancer diagnosed in May of 2014   PRIOR THERAPY: whole Brain irradiation as well as palliative radiotherapy to the chest mass.   CURRENT THERAPY: Systemic chemotherapy with carboplatin for AUC of 5 on day 1 and etoposide 120 mg/M2 on days 1, 2 and 3 with Neulasta support on day 4, status post 2 cycles. First cycle was started on 05/25/2013.  CHEMOTHERAPY INTENT: Palliative  CURRENT # OF CHEMOTHERAPY CYCLES: 2  CURRENT ANTIEMETICS: Zofran, dexamethasone and Compazine  CURRENT SMOKING STATUS: Current smoker and strongly advised to quit smoking and offered smoke cessation program  ORAL CHEMOTHERAPY AND CONSENT: None  CURRENT BISPHOSPHONATES USE: None  PAIN MANAGEMENT: Well-managed with Percocet when necessary  NARCOTICS INDUCED CONSTIPATION: No constipation but has a stool softener at home  LIVING WILL AND CODE STATUS: Discussed with the patient and he is still Full code.  INTERVAL HISTORY: Jacob Webb 62 y.o. male returns to the clinic today for followup visit. The patient is feeling fine today with no specific complaints except for generalized fatigue and weakness. He came on the wheelchair today. The patient denied having any significant chest pain, shortness breath, cough or hemoptysis. He has some lack of appetite. He just completed a course of brain irradiation and is still recovering from his treatment. He denied having any significant nausea or vomiting. He has no fever or chills. The patient had repeat CT scan of the chest, abdomen and pelvis performed recently and he is here for evaluation and discussion of his scan results.    ALLERGIES:  has No Known Allergies.  MEDICATIONS:  Current Outpatient Prescriptions  Medication Sig Dispense Refill  .  HYDROcodone-acetaminophen (NORCO/VICODIN) 5-325 MG per tablet TAKE 1 TABLET BY MOUTH EVERY 6 HOURS AS NEEDED  40 tablet  0  . ondansetron (ZOFRAN) 8 MG tablet Take 1 tablet (8 mg total) by mouth every 12 (twelve) hours as needed for nausea.  30 tablet  0  . prochlorperazine (COMPAZINE) 10 MG tablet TAKE 1 TABLET BY MOUTH EVERY 6 HOURS AS NEEDED FOR VOMITING  30 tablet  0   No current facility-administered medications for this visit.    REVIEW OF SYSTEMS:  A comprehensive review of systems was negative except for: Constitutional: positive for anorexia, fatigue and weight loss Respiratory: positive for dyspnea on exertion   PHYSICAL EXAMINATION: General appearance: alert, cooperative, fatigued and no distress Head: Normocephalic, without obvious abnormality, atraumatic Neck: no adenopathy Lymph nodes: Cervical, supraclavicular, and axillary nodes normal. Resp: clear to auscultation bilaterally Cardio: regular rate and rhythm, S1, S2 normal, no murmur, click, rub or gallop GI: soft, non-tender; bowel sounds normal; no masses,  no organomegaly Extremities: extremities normal, atraumatic, no cyanosis or edema Neurologic: Alert and oriented X 3, normal strength and tone. Normal symmetric reflexes. Normal coordination and gait  ECOG PERFORMANCE STATUS: 2 - Symptomatic, <50% confined to bed  Blood pressure 103/70, pulse 87, temperature 98.1 F (36.7 C), temperature source Oral, resp. rate 20, height 5' 8.5" (1.74 m), weight 155 lb 11.2 oz (70.625 kg).  LABORATORY DATA: Lab Results  Component Value Date   WBC 11.3* 07/13/2013   HGB 14.3 07/13/2013   HCT 41.8 07/13/2013   MCV 86.7 07/13/2013   PLT 230 07/13/2013      Chemistry  Component Value Date/Time   NA 136 07/06/2013 1603   NA 136 06/22/2013 1622   K 3.6 07/06/2013 1603   K 3.8 06/22/2013 1622   CL 97 06/22/2013 1622   CL 105 06/08/2013 1009   CO2 26 07/06/2013 1603   CO2 31 06/22/2013 1622   BUN 19.9 07/06/2013 1603   BUN 21 06/22/2013  1622   CREATININE 0.9 07/06/2013 1603   CREATININE 0.92 06/22/2013 1622      Component Value Date/Time   CALCIUM 9.0 07/06/2013 1603   CALCIUM 10.0 06/22/2013 1622   ALKPHOS 106 07/06/2013 1603   ALKPHOS 152* 06/22/2013 1622   AST 20 07/06/2013 1603   AST 21 06/22/2013 1622   ALT 22 07/06/2013 1603   ALT 27 06/22/2013 1622   BILITOT 0.43 07/06/2013 1603   BILITOT 0.6 06/22/2013 1622       RADIOGRAPHIC STUDIES: Ct Chest W Contrast  07/06/2013   *RADIOLOGY REPORT*  Clinical Data:  Metastatic small cell cancer of the lung. Hypermetabolic right renal mass noted on recent PET CT.  CT CHEST, ABDOMEN AND PELVIS WITH CONTRAST  Technique:  Multidetector CT imaging of the chest, abdomen and pelvis was performed following the standard protocol during bolus administration of intravenous contrast.  Contrast: OMNIPAQUE IOHEXOL 300 MG/ML  SOLN  Comparison:  CT chest 04/27/2013 and nuclear medicine PET CT 05/21/2013  CT CHEST  Findings:  Mediastinum/soft tissues:  A subcarinal lymph node measures approximately 8 mm short axis (previously 8.8 mm on recent PET CT).  Low right paratracheal lymph node measures 9 mm AP diameter, stable.   Prevascular lymph node adjacent to the left main pulmonary artery measures 8 mm short axis.  This is suspected to be smaller on today's examination was felt to be nearly contiguous with the left upper lobe mass on the prior PET-CT.  No hilar lymphadenopathy is detected.  No supraclavicular or axillary lymphadenopathy.  Negative for pleural or pericardial effusion. Normal heart size.  Atherosclerotic changes of the normal caliber abdominal aorta.  Lung windows:  The left upper lobe paramediastinal mass is significantly decreased in size.   Currently measures 2.0 x 2.0 cm axial diameter (previously 4.9 x 3.8 cm on PET CT of 05/21/2013). There is moderate centrilobular and moderate biapical pleuroparenchymal scarring.  No new pulmonary mass or nodules identified.  Thoracic spine vertebral bodies  are normal in height alignment.  No discrete bony abnormality is identified by CT.  IMPRESSION:  1.  Significant decrease in size of left upper lobe paramediastinal mass.  Now measures 2.0 x 2.0 cm greatest axial diameter. 2.  Mediastinal lymph nodes are within normal limits for size, as described above. 3.  Moderate centrilobular emphysema.  CT ABDOMEN AND PELVIS  Findings:  Extending laterally and posteriorly from the posterior cortex of the upper pole right kidney is a rounded mass measuring 11 x 10 mm in axial dimension.  This mass has a narrow connection with the renal parenchyma.  This mass enhances less than the adjacent renal cortex, with Hounsfield units of 40 on the initial series, and 48 on the delayed images.  No masses are seen in the left kidney.  There is no hydronephrosis of either kidney.  Both adrenal glands are normal.  The spleen, gallbladder, pancreas, and liver appear within normal limits.  The abdominal aorta is normal in caliber contains heavy calcified and noncalcified plaque.  There is also atherosclerotic change of the iliac vasculature bilaterally and the proximal femoral arteries.  Negative for  aneurysm.  The stomach appears normal.  Small bowel loops are normal in caliber wall thickness.  The appendix is normal.  The colon is normal in caliber.  Normal sized prostate gland with central calcification.  Gastrohepatic ligament lymph node measures approximately 7 mm.  No pathologically enlarged lymph nodes are identified in the abdomen or pelvis.  There is no ascites.  Bones:  Best seen on the sagittal projection is a 5 mm lucency in the inferior aspect of the L1 vertebral body, which is small bony metastasis cannot be excluded.  There is no evidence of fracture. Chronic bilateral pars defects at L5 noted, with approximately 7 mm anterolisthesis of L5 on S1.  IMPRESSION:  1.  10 mm exophytic right renal lesion.  This lesion was hypermetabolic on recent PET CT.  It enhances to a lesser  degree than the adjacent renal cortex.  This could be a complex renal cyst or renal cell carcinoma.  If further evaluation is desired, MRI of the abdomen with without contrast should be considered. 2.  No pathologically enlarged lymph nodes seen.  A 7 mm gastrohepatic ligament lymph node is noted, and attention is suggested on follow-up. 3.  Small lucency in the L1 vertebral body, for which a small bony metastasis cannot be excluded. 4.  Aorto-iliac atherosclerosis.   Original Report Authenticated By: Britta Mccreedy, M.D.   Ct Abdomen Pelvis W Contrast  07/06/2013   *RADIOLOGY REPORT*  Clinical Data:  Metastatic small cell cancer of the lung. Hypermetabolic right renal mass noted on recent PET CT.  CT CHEST, ABDOMEN AND PELVIS WITH CONTRAST  Technique:  Multidetector CT imaging of the chest, abdomen and pelvis was performed following the standard protocol during bolus administration of intravenous contrast.  Contrast: OMNIPAQUE IOHEXOL 300 MG/ML  SOLN  Comparison:  CT chest 04/27/2013 and nuclear medicine PET CT 05/21/2013  CT CHEST  Findings:  Mediastinum/soft tissues:  A subcarinal lymph node measures approximately 8 mm short axis (previously 8.8 mm on recent PET CT).  Low right paratracheal lymph node measures 9 mm AP diameter, stable.   Prevascular lymph node adjacent to the left main pulmonary artery measures 8 mm short axis.  This is suspected to be smaller on today's examination was felt to be nearly contiguous with the left upper lobe mass on the prior PET-CT.  No hilar lymphadenopathy is detected.  No supraclavicular or axillary lymphadenopathy.  Negative for pleural or pericardial effusion. Normal heart size.  Atherosclerotic changes of the normal caliber abdominal aorta.  Lung windows:  The left upper lobe paramediastinal mass is significantly decreased in size.   Currently measures 2.0 x 2.0 cm axial diameter (previously 4.9 x 3.8 cm on PET CT of 05/21/2013). There is moderate centrilobular and  moderate biapical pleuroparenchymal scarring.  No new pulmonary mass or nodules identified.  Thoracic spine vertebral bodies are normal in height alignment.  No discrete bony abnormality is identified by CT.  IMPRESSION:  1.  Significant decrease in size of left upper lobe paramediastinal mass.  Now measures 2.0 x 2.0 cm greatest axial diameter. 2.  Mediastinal lymph nodes are within normal limits for size, as described above. 3.  Moderate centrilobular emphysema.  CT ABDOMEN AND PELVIS  Findings:  Extending laterally and posteriorly from the posterior cortex of the upper pole right kidney is a rounded mass measuring 11 x 10 mm in axial dimension.  This mass has a narrow connection with the renal parenchyma.  This mass enhances less than the adjacent  renal cortex, with Hounsfield units of 40 on the initial series, and 48 on the delayed images.  No masses are seen in the left kidney.  There is no hydronephrosis of either kidney.  Both adrenal glands are normal.  The spleen, gallbladder, pancreas, and liver appear within normal limits.  The abdominal aorta is normal in caliber contains heavy calcified and noncalcified plaque.  There is also atherosclerotic change of the iliac vasculature bilaterally and the proximal femoral arteries.  Negative for aneurysm.  The stomach appears normal.  Small bowel loops are normal in caliber wall thickness.  The appendix is normal.  The colon is normal in caliber.  Normal sized prostate gland with central calcification.  Gastrohepatic ligament lymph node measures approximately 7 mm.  No pathologically enlarged lymph nodes are identified in the abdomen or pelvis.  There is no ascites.  Bones:  Best seen on the sagittal projection is a 5 mm lucency in the inferior aspect of the L1 vertebral body, which is small bony metastasis cannot be excluded.  There is no evidence of fracture. Chronic bilateral pars defects at L5 noted, with approximately 7 mm anterolisthesis of L5 on S1.   IMPRESSION:  1.  10 mm exophytic right renal lesion.  This lesion was hypermetabolic on recent PET CT.  It enhances to a lesser degree than the adjacent renal cortex.  This could be a complex renal cyst or renal cell carcinoma.  If further evaluation is desired, MRI of the abdomen with without contrast should be considered. 2.  No pathologically enlarged lymph nodes seen.  A 7 mm gastrohepatic ligament lymph node is noted, and attention is suggested on follow-up. 3.  Small lucency in the L1 vertebral body, for which a small bony metastasis cannot be excluded. 4.  Aorto-iliac atherosclerosis.   Original Report Authenticated By: Britta Mccreedy, M.D.    ASSESSMENT AND PLAN: This is a very pleasant 62 years old white male with extensive stage small cell lung cancer currently undergoing systemic chemotherapy with carboplatin and etoposide status post 2 cycles. The patient also completed a course of palliative radiotherapy to the chest as well as whole brain irradiation. He has significant improvement in his disease based on the recent CT scan results. I discussed the scan results and showed the images to the patient. I recommended for him to continue his chemotherapy with the same regimen but I will delay the start of cycle #3 till next week to give the patient more time to recover from the adverse effect of his radiotherapy.  He would come back for followup visit in 4 weeks with the start of cycle number 4. He was advised to call immediately if he has any concerning symptoms in the interval. The patient voices understanding of current disease status and treatment options and is in agreement with the current care plan.  All questions were answered. The patient knows to call the clinic with any problems, questions or concerns. We can certainly see the patient much sooner if necessary.  I spent 15 minutes counseling the patient face to face. The total time spent in the appointment was 25 minutes.

## 2013-07-14 ENCOUNTER — Telehealth: Payer: Self-pay | Admitting: *Deleted

## 2013-07-14 ENCOUNTER — Telehealth: Payer: Self-pay | Admitting: Internal Medicine

## 2013-07-14 ENCOUNTER — Ambulatory Visit: Payer: Self-pay

## 2013-07-14 NOTE — Telephone Encounter (Signed)
Per staff message and POF I have scheduled appts.  JMW  

## 2013-07-14 NOTE — Telephone Encounter (Signed)
s.w. pt and advised on nxt week appts.Marland Kitchen.he will pick up updated sched

## 2013-07-15 ENCOUNTER — Ambulatory Visit: Payer: Self-pay

## 2013-07-16 ENCOUNTER — Ambulatory Visit: Payer: Self-pay

## 2013-07-20 ENCOUNTER — Ambulatory Visit (HOSPITAL_BASED_OUTPATIENT_CLINIC_OR_DEPARTMENT_OTHER): Payer: Medicaid Other

## 2013-07-20 ENCOUNTER — Other Ambulatory Visit (HOSPITAL_BASED_OUTPATIENT_CLINIC_OR_DEPARTMENT_OTHER): Payer: Self-pay | Admitting: Lab

## 2013-07-20 ENCOUNTER — Ambulatory Visit: Payer: Self-pay | Admitting: Nutrition

## 2013-07-20 ENCOUNTER — Other Ambulatory Visit: Payer: Self-pay | Admitting: Lab

## 2013-07-20 DIAGNOSIS — Z5111 Encounter for antineoplastic chemotherapy: Secondary | ICD-10-CM

## 2013-07-20 DIAGNOSIS — C3490 Malignant neoplasm of unspecified part of unspecified bronchus or lung: Secondary | ICD-10-CM

## 2013-07-20 DIAGNOSIS — C3410 Malignant neoplasm of upper lobe, unspecified bronchus or lung: Secondary | ICD-10-CM

## 2013-07-20 DIAGNOSIS — C7931 Secondary malignant neoplasm of brain: Secondary | ICD-10-CM

## 2013-07-20 LAB — CBC WITH DIFFERENTIAL/PLATELET
Basophils Absolute: 0.1 10*3/uL (ref 0.0–0.1)
Eosinophils Absolute: 0.3 10*3/uL (ref 0.0–0.5)
HGB: 12.9 g/dL — ABNORMAL LOW (ref 13.0–17.1)
LYMPH%: 14.3 % (ref 14.0–49.0)
MCV: 88.7 fL (ref 79.3–98.0)
MONO#: 0.9 10*3/uL (ref 0.1–0.9)
MONO%: 11.5 % (ref 0.0–14.0)
NEUT#: 5.5 10*3/uL (ref 1.5–6.5)
Platelets: 133 10*3/uL — ABNORMAL LOW (ref 140–400)
RBC: 4.26 10*6/uL (ref 4.20–5.82)
RDW: 16.9 % — ABNORMAL HIGH (ref 11.0–14.6)
WBC: 7.9 10*3/uL (ref 4.0–10.3)
nRBC: 0 % (ref 0–0)

## 2013-07-20 LAB — COMPREHENSIVE METABOLIC PANEL (CC13)
ALT: 13 U/L (ref 0–55)
CO2: 30 mEq/L — ABNORMAL HIGH (ref 22–29)
Calcium: 9.7 mg/dL (ref 8.4–10.4)
Chloride: 107 mEq/L (ref 98–109)
Glucose: 87 mg/dl (ref 70–140)
Sodium: 144 mEq/L (ref 136–145)
Total Bilirubin: 0.32 mg/dL (ref 0.20–1.20)
Total Protein: 6.3 g/dL — ABNORMAL LOW (ref 6.4–8.3)

## 2013-07-20 MED ORDER — ONDANSETRON 16 MG/50ML IVPB (CHCC)
16.0000 mg | Freq: Once | INTRAVENOUS | Status: AC
  Administered 2013-07-20: 16 mg via INTRAVENOUS

## 2013-07-20 MED ORDER — SODIUM CHLORIDE 0.9 % IV SOLN
Freq: Once | INTRAVENOUS | Status: AC
  Administered 2013-07-20: 15:00:00 via INTRAVENOUS

## 2013-07-20 MED ORDER — SODIUM CHLORIDE 0.9 % IV SOLN
584.5000 mg | Freq: Once | INTRAVENOUS | Status: AC
  Administered 2013-07-20: 580 mg via INTRAVENOUS
  Filled 2013-07-20: qty 58

## 2013-07-20 MED ORDER — DEXAMETHASONE SODIUM PHOSPHATE 20 MG/5ML IJ SOLN
20.0000 mg | Freq: Once | INTRAMUSCULAR | Status: AC
  Administered 2013-07-20: 20 mg via INTRAVENOUS

## 2013-07-20 MED ORDER — SODIUM CHLORIDE 0.9 % IV SOLN
120.0000 mg/m2 | Freq: Once | INTRAVENOUS | Status: AC
  Administered 2013-07-20: 230 mg via INTRAVENOUS
  Filled 2013-07-20: qty 11.5

## 2013-07-20 NOTE — Patient Instructions (Signed)
[facility] Discharge Instructions for Patients Receiving Chemotherapy  Today you received the following chemotherapy agents [id number]  To help prevent nausea and vomiting after your treatment, we encourage you to take your nausea medication as directed   If you develop nausea and vomiting that is not controlled by your nausea medication, call the clinic.   BELOW ARE SYMPTOMS THAT SHOULD BE REPORTED IMMEDIATELY:  *FEVER GREATER THAN 100.5 F  *CHILLS WITH OR WITHOUT FEVER  NAUSEA AND VOMITING THAT IS NOT CONTROLLED WITH YOUR NAUSEA MEDICATION  *UNUSUAL SHORTNESS OF BREATH  *UNUSUAL BRUISING OR BLEEDING  TENDERNESS IN MOUTH AND THROAT WITH OR WITHOUT PRESENCE OF ULCERS  *URINARY PROBLEMS  *BOWEL PROBLEMS  UNUSUAL RASH Items with * indicate a potential emergency and should be followed up as soon as possible.  Feel free to call the clinic you have any questions or concerns. The clinic phone number is [phone].

## 2013-07-20 NOTE — Progress Notes (Signed)
I spoke briefly with patient today in the chemotherapy room.  He has lost 12 pounds over the last month.  He reports increased nausea after radiation therapy.  His weight was documented as 155.7 pounds July 28 from 167.4 pounds June 30.  Patient reports he has not been drinking Ensure Plus because he has not had any.  Nutrition diagnosis: Unintended weight loss continues.  Intervention: Patient was educated to increase high-calorie, high-protein foods throughout the day.  I will provide a second complimentary case of Ensure Plus for patient to take with him today.  I have recommended patient drink Ensure Plus twice a day.  Teach back method used.  Monitoring, evaluation, goals: Patient has not tolerated oral intake and has had a 12 pound weight loss in one month.  Next visit: Patient will contact me for further nutrition needs.  There is no chemotherapy scheduled after this week.

## 2013-07-20 NOTE — Progress Notes (Signed)
  Radiation Oncology         [phone] ________________________________  Name: Jacob Webb MRN: [id number]  Date: 07/10/2013  DOB: 04/13/1951  End of Treatment Note  Diagnosis:   Extensive stage small cell lung cancer     Indication for treatment:  palliative       Radiation treatment dates:   06/29/13 - 07/10/13  Site/dose:    1. Whole brain radiotherapy using 2 customized fields to 30 Gy in 10 fractions. 2. Palliatve thoracic radiotherapy using 3-field 3D-conformal technique to 30 Gy in 10 fractions.  Narrative: The patient tolerated radiation treatment relatively well.   The patient did not have significant acute issues during treatment.  Plan: The patient has completed radiation treatment. The patient will return to radiation oncology clinic for routine followup in one month. I advised the patient to call or return sooner if they have any questions or concerns related to their recovery or treatment. ________________________________  Radene Gunning, M.D., Ph.D.

## 2013-07-21 ENCOUNTER — Ambulatory Visit (HOSPITAL_BASED_OUTPATIENT_CLINIC_OR_DEPARTMENT_OTHER): Payer: Medicaid Other

## 2013-07-21 VITALS — BP 99/62 | HR 82 | Temp 97.7°F

## 2013-07-21 DIAGNOSIS — C3410 Malignant neoplasm of upper lobe, unspecified bronchus or lung: Secondary | ICD-10-CM

## 2013-07-21 DIAGNOSIS — C3490 Malignant neoplasm of unspecified part of unspecified bronchus or lung: Secondary | ICD-10-CM

## 2013-07-21 DIAGNOSIS — Z5111 Encounter for antineoplastic chemotherapy: Secondary | ICD-10-CM

## 2013-07-21 DIAGNOSIS — C7931 Secondary malignant neoplasm of brain: Secondary | ICD-10-CM

## 2013-07-21 MED ORDER — SODIUM CHLORIDE 0.9 % IV SOLN
Freq: Once | INTRAVENOUS | Status: AC
  Administered 2013-07-21: 16:00:00 via INTRAVENOUS

## 2013-07-21 MED ORDER — ONDANSETRON 8 MG/50ML IVPB (CHCC)
8.0000 mg | Freq: Once | INTRAVENOUS | Status: AC
  Administered 2013-07-21: 8 mg via INTRAVENOUS

## 2013-07-21 MED ORDER — SODIUM CHLORIDE 0.9 % IV SOLN
120.0000 mg/m2 | Freq: Once | INTRAVENOUS | Status: AC
  Administered 2013-07-21: 230 mg via INTRAVENOUS
  Filled 2013-07-21: qty 11.5

## 2013-07-21 MED ORDER — DEXAMETHASONE SODIUM PHOSPHATE 10 MG/ML IJ SOLN
10.0000 mg | Freq: Once | INTRAMUSCULAR | Status: AC
  Administered 2013-07-21: 10 mg via INTRAVENOUS

## 2013-07-21 NOTE — Patient Instructions (Addendum)
[facility] Discharge Instructions for Patients Receiving Chemotherapy  Today you received the following chemotherapy agents VP16  To help prevent nausea and vomiting after your treatment, we encourage you to take your nausea medication as prescribed by Dr. Arbutus Ped   If you develop nausea and vomiting that is not controlled by your nausea medication, call the clinic.   BELOW ARE SYMPTOMS THAT SHOULD BE REPORTED IMMEDIATELY:  *FEVER GREATER THAN 100.5 F  *CHILLS WITH OR WITHOUT FEVER  NAUSEA AND VOMITING THAT IS NOT CONTROLLED WITH YOUR NAUSEA MEDICATION  *UNUSUAL SHORTNESS OF BREATH  *UNUSUAL BRUISING OR BLEEDING  TENDERNESS IN MOUTH AND THROAT WITH OR WITHOUT PRESENCE OF ULCERS  *URINARY PROBLEMS  *BOWEL PROBLEMS  UNUSUAL RASH Items with * indicate a potential emergency and should be followed up as soon as possible.  Feel free to call the clinic you have any questions or concerns. The clinic phone number is [phone].

## 2013-07-22 ENCOUNTER — Other Ambulatory Visit: Payer: Self-pay | Admitting: Radiation Oncology

## 2013-07-22 ENCOUNTER — Ambulatory Visit (HOSPITAL_BASED_OUTPATIENT_CLINIC_OR_DEPARTMENT_OTHER): Payer: Medicaid Other

## 2013-07-22 DIAGNOSIS — Z5111 Encounter for antineoplastic chemotherapy: Secondary | ICD-10-CM

## 2013-07-22 DIAGNOSIS — C3490 Malignant neoplasm of unspecified part of unspecified bronchus or lung: Secondary | ICD-10-CM

## 2013-07-22 DIAGNOSIS — C3410 Malignant neoplasm of upper lobe, unspecified bronchus or lung: Secondary | ICD-10-CM

## 2013-07-22 MED ORDER — SODIUM CHLORIDE 0.9 % IV SOLN
120.0000 mg/m2 | Freq: Once | INTRAVENOUS | Status: AC
  Administered 2013-07-22: 230 mg via INTRAVENOUS
  Filled 2013-07-22: qty 11.5

## 2013-07-22 MED ORDER — SODIUM CHLORIDE 0.9 % IV SOLN
Freq: Once | INTRAVENOUS | Status: AC
  Administered 2013-07-22: 17:00:00 via INTRAVENOUS

## 2013-07-22 MED ORDER — DEXAMETHASONE SODIUM PHOSPHATE 10 MG/ML IJ SOLN
10.0000 mg | Freq: Once | INTRAMUSCULAR | Status: AC
  Administered 2013-07-22: 10 mg via INTRAVENOUS

## 2013-07-22 MED ORDER — ONDANSETRON 8 MG/50ML IVPB (CHCC)
8.0000 mg | Freq: Once | INTRAVENOUS | Status: AC
  Administered 2013-07-22: 8 mg via INTRAVENOUS

## 2013-07-22 NOTE — Patient Instructions (Signed)
[facility] Discharge Instructions for Patients Receiving Chemotherapy  Today you received the following chemotherapy agents VP 16  To help prevent nausea and vomiting after your treatment, we encourage you to take your nausea medication as needed   If you develop nausea and vomiting that is not controlled by your nausea medication, call the clinic.   BELOW ARE SYMPTOMS THAT SHOULD BE REPORTED IMMEDIATELY:  *FEVER GREATER THAN 100.5 F  *CHILLS WITH OR WITHOUT FEVER  NAUSEA AND VOMITING THAT IS NOT CONTROLLED WITH YOUR NAUSEA MEDICATION  *UNUSUAL SHORTNESS OF BREATH  *UNUSUAL BRUISING OR BLEEDING  TENDERNESS IN MOUTH AND THROAT WITH OR WITHOUT PRESENCE OF ULCERS  *URINARY PROBLEMS  *BOWEL PROBLEMS  UNUSUAL RASH Items with * indicate a potential emergency and should be followed up as soon as possible.  Feel free to call the clinic you have any questions or concerns. The clinic phone number is [phone].    

## 2013-07-23 ENCOUNTER — Ambulatory Visit (HOSPITAL_BASED_OUTPATIENT_CLINIC_OR_DEPARTMENT_OTHER): Payer: Self-pay

## 2013-07-23 ENCOUNTER — Other Ambulatory Visit: Payer: Self-pay | Admitting: Radiation Oncology

## 2013-07-23 VITALS — BP 102/65 | HR 76 | Temp 98.0°F

## 2013-07-23 DIAGNOSIS — C3490 Malignant neoplasm of unspecified part of unspecified bronchus or lung: Secondary | ICD-10-CM

## 2013-07-23 DIAGNOSIS — Z5189 Encounter for other specified aftercare: Secondary | ICD-10-CM

## 2013-07-23 MED ORDER — PROMETHAZINE HCL 25 MG PO TABS: 25.0000 mg | ORAL_TABLET | Freq: Four times a day (QID) | ORAL | Status: DC | PRN

## 2013-07-23 MED ORDER — PEGFILGRASTIM INJECTION 6 MG/0.6ML
6.0000 mg | Freq: Once | SUBCUTANEOUS | Status: AC
  Administered 2013-07-23: 6 mg via SUBCUTANEOUS
  Filled 2013-07-23: qty 0.6

## 2013-07-24 ENCOUNTER — Telehealth: Payer: Self-pay | Admitting: Oncology

## 2013-07-24 ENCOUNTER — Encounter: Payer: Self-pay | Admitting: Oncology

## 2013-07-24 MED ORDER — HYDROCODONE-ACETAMINOPHEN 5-325 MG PO TABS: ORAL_TABLET | ORAL | Status: DC

## 2013-07-24 NOTE — Telephone Encounter (Signed)
Called and spoke to Jacob Webb.  Advised him that Dr. Mitzi Hansen had sent in a prescription for phenergan for his nausea to [facility].  Told him to stop taking the compazine.  Also advised him that per Dr. Mitzi Hansen, he can take 1-2 tablets of his hydrocodone/acetaminophen 5-325 mg every 4-6 hours as needed for pain.  Told him to take no more than 10 tablets per day.  Jacob Webb stated that he understood and agreed not to take compazine and to take 1-2 tablets of the hydrocodone/acetnaminphen every 4-6 hours as needed, not to exceed 10 tablets per day.

## 2013-07-27 ENCOUNTER — Other Ambulatory Visit: Payer: Self-pay | Admitting: Lab

## 2013-07-27 ENCOUNTER — Other Ambulatory Visit (HOSPITAL_BASED_OUTPATIENT_CLINIC_OR_DEPARTMENT_OTHER): Payer: Self-pay | Admitting: Lab

## 2013-07-27 ENCOUNTER — Ambulatory Visit: Payer: Self-pay

## 2013-07-27 ENCOUNTER — Encounter: Payer: Self-pay | Admitting: *Deleted

## 2013-07-27 DIAGNOSIS — C3410 Malignant neoplasm of upper lobe, unspecified bronchus or lung: Secondary | ICD-10-CM

## 2013-07-27 LAB — COMPREHENSIVE METABOLIC PANEL (CC13)
AST: 14 U/L (ref 5–34)
Albumin: 3.5 g/dL (ref 3.5–5.0)
BUN: 21.8 mg/dL (ref 7.0–26.0)
CO2: 26 mEq/L (ref 22–29)
Calcium: 9.6 mg/dL (ref 8.4–10.4)
Chloride: 104 mEq/L (ref 98–109)
Creatinine: 0.8 mg/dL (ref 0.7–1.3)
Glucose: 98 mg/dl (ref 70–140)
Potassium: 4.4 mEq/L (ref 3.5–5.1)

## 2013-07-27 LAB — CBC WITH DIFFERENTIAL/PLATELET
Basophils Absolute: 0 10*3/uL (ref 0.0–0.1)
HCT: 34.7 % — ABNORMAL LOW (ref 38.4–49.9)
HGB: 11.8 g/dL — ABNORMAL LOW (ref 13.0–17.1)
LYMPH%: 13.1 % — ABNORMAL LOW (ref 14.0–49.0)
MCHC: 34 g/dL (ref 32.0–36.0)
MONO#: 0.2 10*3/uL (ref 0.1–0.9)
NEUT%: 74.8 % (ref 39.0–75.0)
Platelets: 40 10*3/uL — ABNORMAL LOW (ref 140–400)
WBC: 3.4 10*3/uL — ABNORMAL LOW (ref 4.0–10.3)
lymph#: 0.4 10*3/uL — ABNORMAL LOW (ref 0.9–3.3)

## 2013-07-28 ENCOUNTER — Ambulatory Visit: Payer: Self-pay

## 2013-07-29 ENCOUNTER — Ambulatory Visit: Payer: Self-pay

## 2013-07-30 ENCOUNTER — Ambulatory Visit: Payer: Self-pay

## 2013-08-03 ENCOUNTER — Other Ambulatory Visit (HOSPITAL_BASED_OUTPATIENT_CLINIC_OR_DEPARTMENT_OTHER): Payer: Medicaid Other | Admitting: Lab

## 2013-08-03 ENCOUNTER — Ambulatory Visit: Payer: Self-pay

## 2013-08-03 DIAGNOSIS — C3410 Malignant neoplasm of upper lobe, unspecified bronchus or lung: Secondary | ICD-10-CM

## 2013-08-03 LAB — CBC WITH DIFFERENTIAL/PLATELET
Basophils Absolute: 0 10*3/uL (ref 0.0–0.1)
EOS%: 1.4 % (ref 0.0–7.0)
HCT: 27.8 % — ABNORMAL LOW (ref 38.4–49.9)
HGB: 9.5 g/dL — ABNORMAL LOW (ref 13.0–17.1)
MCH: 30.8 pg (ref 27.2–33.4)
MONO#: 0.7 10*3/uL (ref 0.1–0.9)
NEUT#: 5 10*3/uL (ref 1.5–6.5)
RDW: 16.3 % — ABNORMAL HIGH (ref 11.0–14.6)
WBC: 6.6 10*3/uL (ref 4.0–10.3)
lymph#: 0.7 10*3/uL — ABNORMAL LOW (ref 0.9–3.3)

## 2013-08-03 LAB — COMPREHENSIVE METABOLIC PANEL (CC13)
Albumin: 3.2 g/dL — ABNORMAL LOW (ref 3.5–5.0)
BUN: 15.9 mg/dL (ref 7.0–26.0)
Calcium: 9.3 mg/dL (ref 8.4–10.4)
Chloride: 106 mEq/L (ref 98–109)
Glucose: 91 mg/dl (ref 70–140)
Potassium: 3.8 mEq/L (ref 3.5–5.1)
Sodium: 140 mEq/L (ref 136–145)
Total Protein: 6.3 g/dL — ABNORMAL LOW (ref 6.4–8.3)

## 2013-08-04 ENCOUNTER — Ambulatory Visit: Payer: Self-pay

## 2013-08-05 ENCOUNTER — Ambulatory Visit: Payer: Self-pay

## 2013-08-06 ENCOUNTER — Ambulatory Visit: Payer: Self-pay

## 2013-08-10 ENCOUNTER — Other Ambulatory Visit: Payer: Self-pay | Admitting: Lab

## 2013-08-10 ENCOUNTER — Other Ambulatory Visit (HOSPITAL_BASED_OUTPATIENT_CLINIC_OR_DEPARTMENT_OTHER): Payer: Medicaid Other | Admitting: Lab

## 2013-08-10 ENCOUNTER — Ambulatory Visit (HOSPITAL_BASED_OUTPATIENT_CLINIC_OR_DEPARTMENT_OTHER): Payer: Medicaid Other | Admitting: Physician Assistant

## 2013-08-10 VITALS — BP 105/56 | HR 68 | Temp 97.8°F | Resp 20 | Ht 68.5 in | Wt 155.7 lb

## 2013-08-10 DIAGNOSIS — C3410 Malignant neoplasm of upper lobe, unspecified bronchus or lung: Secondary | ICD-10-CM

## 2013-08-10 DIAGNOSIS — C3490 Malignant neoplasm of unspecified part of unspecified bronchus or lung: Secondary | ICD-10-CM

## 2013-08-10 LAB — CBC WITH DIFFERENTIAL/PLATELET
Basophils Absolute: 0.1 10*3/uL (ref 0.0–0.1)
EOS%: 0.6 % (ref 0.0–7.0)
Eosinophils Absolute: 0.1 10*3/uL (ref 0.0–0.5)
HGB: 11 g/dL — ABNORMAL LOW (ref 13.0–17.1)
MCH: 31.8 pg (ref 27.2–33.4)
MONO%: 9.8 % (ref 0.0–14.0)
NEUT#: 7.1 10*3/uL — ABNORMAL HIGH (ref 1.5–6.5)
RBC: 3.47 10*6/uL — ABNORMAL LOW (ref 4.20–5.82)
RDW: 17.8 % — ABNORMAL HIGH (ref 11.0–14.6)
lymph#: 1.1 10*3/uL (ref 0.9–3.3)

## 2013-08-10 LAB — COMPREHENSIVE METABOLIC PANEL (CC13)
AST: 16 U/L (ref 5–34)
Albumin: 3 g/dL — ABNORMAL LOW (ref 3.5–5.0)
Alkaline Phosphatase: 108 U/L (ref 40–150)
BUN: 12.7 mg/dL (ref 7.0–26.0)
Calcium: 9.1 mg/dL (ref 8.4–10.4)
Chloride: 109 mEq/L (ref 98–109)
Potassium: 4.1 mEq/L (ref 3.5–5.1)
Sodium: 143 mEq/L (ref 136–145)
Total Protein: 6.2 g/dL — ABNORMAL LOW (ref 6.4–8.3)

## 2013-08-10 MED ORDER — HYDROCODONE-ACETAMINOPHEN 5-325 MG PO TABS: ORAL_TABLET | ORAL | Status: DC

## 2013-08-10 MED ORDER — PROMETHAZINE HCL 25 MG PO TABS: 25.0000 mg | ORAL_TABLET | Freq: Four times a day (QID) | ORAL | Status: DC | PRN

## 2013-08-11 ENCOUNTER — Telehealth: Payer: Self-pay | Admitting: *Deleted

## 2013-08-11 ENCOUNTER — Ambulatory Visit (HOSPITAL_BASED_OUTPATIENT_CLINIC_OR_DEPARTMENT_OTHER): Payer: Medicaid Other

## 2013-08-11 DIAGNOSIS — C3490 Malignant neoplasm of unspecified part of unspecified bronchus or lung: Secondary | ICD-10-CM

## 2013-08-11 DIAGNOSIS — C7931 Secondary malignant neoplasm of brain: Secondary | ICD-10-CM

## 2013-08-11 DIAGNOSIS — Z5111 Encounter for antineoplastic chemotherapy: Secondary | ICD-10-CM

## 2013-08-11 DIAGNOSIS — C3410 Malignant neoplasm of upper lobe, unspecified bronchus or lung: Secondary | ICD-10-CM

## 2013-08-11 MED ORDER — SODIUM CHLORIDE 0.9 % IV SOLN: 716.0000 mg | Freq: Once | INTRAVENOUS | Status: DC

## 2013-08-11 MED ORDER — OMEPRAZOLE 40 MG PO CPDR: DELAYED_RELEASE_CAPSULE | ORAL | Status: DC

## 2013-08-11 MED ORDER — SODIUM CHLORIDE 0.9 % IV SOLN
120.0000 mg/m2 | Freq: Once | INTRAVENOUS | Status: AC
  Administered 2013-08-11: 230 mg via INTRAVENOUS
  Filled 2013-08-11: qty 11.5

## 2013-08-11 MED ORDER — DEXAMETHASONE SODIUM PHOSPHATE 20 MG/5ML IJ SOLN
20.0000 mg | Freq: Once | INTRAMUSCULAR | Status: AC
  Administered 2013-08-11: 20 mg via INTRAVENOUS

## 2013-08-11 MED ORDER — ONDANSETRON 16 MG/50ML IVPB (CHCC)
16.0000 mg | Freq: Once | INTRAVENOUS | Status: AC
  Administered 2013-08-11: 16 mg via INTRAVENOUS

## 2013-08-11 MED ORDER — SODIUM CHLORIDE 0.9 % IV SOLN
580.0000 mg | Freq: Once | INTRAVENOUS | Status: AC
  Administered 2013-08-11: 580 mg via INTRAVENOUS
  Filled 2013-08-11: qty 58

## 2013-08-11 MED ORDER — SODIUM CHLORIDE 0.9 % IV SOLN
Freq: Once | INTRAVENOUS | Status: AC
  Administered 2013-08-11: 10:00:00 via INTRAVENOUS

## 2013-08-11 NOTE — Telephone Encounter (Signed)
Patient called from pharmacy, no Prilosec ready Spoke with Tiana Loft, PA, orders rec'd.

## 2013-08-11 NOTE — Progress Notes (Addendum)
[facility] [phone]   [phone]  SHARED VISIT PROGRESS NOTE  No PCP Per Patient [address] [place] [address]  DIAGNOSIS AND STAGE: Extensive stage small cell lung cancer diagnosed in May of 2014   PRIOR THERAPY: whole Brain irradiation as well as palliative radiotherapy to the chest mass.   CURRENT THERAPY: Systemic chemotherapy with carboplatin for AUC of 5 on day 1 and etoposide 120 mg/M2 on days 1, 2 and 3 with Neulasta support on day 4, status post 3 cycles. First cycle was started on 05/25/2013.  CHEMOTHERAPY INTENT: Palliative  CURRENT # OF CHEMOTHERAPY CYCLES: 3  CURRENT ANTIEMETICS: Zofran, dexamethasone and Compazine  CURRENT SMOKING STATUS: Current smoker and strongly advised to quit smoking and offered smoke cessation program  ORAL CHEMOTHERAPY AND CONSENT: None  CURRENT BISPHOSPHONATES USE: None  PAIN MANAGEMENT: Well-managed with Percocet when necessary  NARCOTICS INDUCED CONSTIPATION: No constipation but has a stool softener at home  LIVING WILL AND CODE STATUS: Discussed with the patient and he is still Full code.  INTERVAL HISTORY: Jacob Webb 62 y.o. male returns to the clinic today for followup visit. The patient is feeling fine today with no specific complaints except for decreased appetite. This is primarily due to decreased taste. He is feeling stronger overall and walked into today's appointment of is own accord. The patient denied having any significant chest pain, shortness breath, cough or hemoptysis. He denied having any significant nausea or vomiting. He has no fever or chills. He is due for cycle 4 of his systemic chemotherapy with carboplatin and etoposide with Neulasta support. She request a refill prescriptions for his promethazine for nausea which tends to work better for him in for his Vicodin. He also complains of some "stomach discomfort", described as more of a burning  sensation.   ALLERGIES:  has No Known Allergies.  MEDICATIONS:  Current Outpatient Prescriptions  Medication Sig Dispense Refill  . HYDROcodone-acetaminophen (NORCO/VICODIN) 5-325 MG per tablet TAKE 1 TABLET BY MOUTH EVERY 6 HOURS AS NEEDED - Per Dr. Mitzi Hansen on 07/23/2013, change to take 1-2 tablets by mouth every 4-6 hours as needed.  Not to exceed 10 tablets in 24 hours.      Marland Kitchen HYDROcodone-acetaminophen (NORCO/VICODIN) 5-325 MG per tablet TAKE 1-2 TABLETS BY MOUTH EVERY 4-6 HOURS AS NEEDED. NO MORE THAN 8 PER DAY.  60 tablet  0  . methylPREDNIsolone (MEDROL DOSPACK) 4 MG tablet follow package directions  21 tablet  0  . omeprazole (PRILOSEC) 40 MG capsule 1 po daily, 30-8minutes prior to first meal of the day.  30 capsule  1  . ondansetron (ZOFRAN) 8 MG tablet Take 1 tablet (8 mg total) by mouth every 12 (twelve) hours as needed for nausea.  30 tablet  0  . promethazine (PHENERGAN) 25 MG tablet Take 1 tablet (25 mg total) by mouth every 6 (six) hours as needed for nausea.  30 tablet  1   No current facility-administered medications for this visit.    REVIEW OF SYSTEMS:  A comprehensive review of systems was negative except for: Constitutional: positive for anorexia and fatigue Gastrointestinal: positive for reflux symptoms   PHYSICAL EXAMINATION: General appearance: alert, cooperative, fatigued and no distress Head: Normocephalic, without obvious abnormality, atraumatic Neck: no adenopathy Lymph nodes: Cervical, supraclavicular, and axillary nodes normal. Resp: clear to auscultation bilaterally Cardio: regular rate and rhythm, S1, S2 normal, no murmur, click, rub or gallop GI: soft, non-tender; bowel sounds normal; no  masses,  no organomegaly Extremities: extremities normal, atraumatic, no cyanosis or edema Neurologic: Alert and oriented X 3, normal strength and tone. Normal symmetric reflexes. Normal coordination and gait  ECOG PERFORMANCE STATUS: 2 - Symptomatic, <50% confined to  bed  Blood pressure 105/56, pulse 68, temperature 97.8 F (36.6 C), temperature source Oral, resp. rate 20, height 5' 8.5" (1.74 m), weight 155 lb 11.2 oz (70.625 kg).  LABORATORY DATA: Lab Results  Component Value Date   WBC 9.3 08/10/2013   HGB 11.0* 08/10/2013   HCT 31.7* 08/10/2013   MCV 91.3 08/10/2013   PLT 295 08/10/2013      Chemistry      Component Value Date/Time   NA 143 08/10/2013 1204   NA 136 06/22/2013 1622   K 4.1 08/10/2013 1204   K 3.8 06/22/2013 1622   CL 97 06/22/2013 1622   CL 105 06/08/2013 1009   CO2 26 08/10/2013 1204   CO2 31 06/22/2013 1622   BUN 12.7 08/10/2013 1204   BUN 21 06/22/2013 1622   CREATININE 0.7 08/10/2013 1204   CREATININE 0.92 06/22/2013 1622      Component Value Date/Time   CALCIUM 9.1 08/10/2013 1204   CALCIUM 10.0 06/22/2013 1622   ALKPHOS 108 08/10/2013 1204   ALKPHOS 152* 06/22/2013 1622   AST 16 08/10/2013 1204   AST 21 06/22/2013 1622   ALT 14 08/10/2013 1204   ALT 27 06/22/2013 1622   BILITOT <0.20 08/10/2013 1204   BILITOT 0.6 06/22/2013 1622       RADIOGRAPHIC STUDIES: Ct Chest W Contrast  07/06/2013   *RADIOLOGY REPORT*  Clinical Data:  Metastatic small cell cancer of the lung. Hypermetabolic right renal mass noted on recent PET CT.  CT CHEST, ABDOMEN AND PELVIS WITH CONTRAST  Technique:  Multidetector CT imaging of the chest, abdomen and pelvis was performed following the standard protocol during bolus administration of intravenous contrast.  Contrast: OMNIPAQUE IOHEXOL 300 MG/ML  SOLN  Comparison:  CT chest 04/27/2013 and nuclear medicine PET CT 05/21/2013  CT CHEST  Findings:  Mediastinum/soft tissues:  A subcarinal lymph node measures approximately 8 mm short axis (previously 8.8 mm on recent PET CT).  Low right paratracheal lymph node measures 9 mm AP diameter, stable.   Prevascular lymph node adjacent to the left main pulmonary artery measures 8 mm short axis.  This is suspected to be smaller on today's examination was felt to be nearly  contiguous with the left upper lobe mass on the prior PET-CT.  No hilar lymphadenopathy is detected.  No supraclavicular or axillary lymphadenopathy.  Negative for pleural or pericardial effusion. Normal heart size.  Atherosclerotic changes of the normal caliber abdominal aorta.  Lung windows:  The left upper lobe paramediastinal mass is significantly decreased in size.   Currently measures 2.0 x 2.0 cm axial diameter (previously 4.9 x 3.8 cm on PET CT of 05/21/2013). There is moderate centrilobular and moderate biapical pleuroparenchymal scarring.  No new pulmonary mass or nodules identified.  Thoracic spine vertebral bodies are normal in height alignment.  No discrete bony abnormality is identified by CT.  IMPRESSION:  1.  Significant decrease in size of left upper lobe paramediastinal mass.  Now measures 2.0 x 2.0 cm greatest axial diameter. 2.  Mediastinal lymph nodes are within normal limits for size, as described above. 3.  Moderate centrilobular emphysema.  CT ABDOMEN AND PELVIS  Findings:  Extending laterally and posteriorly from the posterior cortex of the upper pole right kidney is  a rounded mass measuring 11 x 10 mm in axial dimension.  This mass has a narrow connection with the renal parenchyma.  This mass enhances less than the adjacent renal cortex, with Hounsfield units of 40 on the initial series, and 48 on the delayed images.  No masses are seen in the left kidney.  There is no hydronephrosis of either kidney.  Both adrenal glands are normal.  The spleen, gallbladder, pancreas, and liver appear within normal limits.  The abdominal aorta is normal in caliber contains heavy calcified and noncalcified plaque.  There is also atherosclerotic change of the iliac vasculature bilaterally and the proximal femoral arteries.  Negative for aneurysm.  The stomach appears normal.  Small bowel loops are normal in caliber wall thickness.  The appendix is normal.  The colon is normal in caliber.  Normal sized  prostate gland with central calcification.  Gastrohepatic ligament lymph node measures approximately 7 mm.  No pathologically enlarged lymph nodes are identified in the abdomen or pelvis.  There is no ascites.  Bones:  Best seen on the sagittal projection is a 5 mm lucency in the inferior aspect of the L1 vertebral body, which is small bony metastasis cannot be excluded.  There is no evidence of fracture. Chronic bilateral pars defects at L5 noted, with approximately 7 mm anterolisthesis of L5 on S1.  IMPRESSION:  1.  10 mm exophytic right renal lesion.  This lesion was hypermetabolic on recent PET CT.  It enhances to a lesser degree than the adjacent renal cortex.  This could be a complex renal cyst or renal cell carcinoma.  If further evaluation is desired, MRI of the abdomen with without contrast should be considered. 2.  No pathologically enlarged lymph nodes seen.  A 7 mm gastrohepatic ligament lymph node is noted, and attention is suggested on follow-up. 3.  Small lucency in the L1 vertebral body, for which a small bony metastasis cannot be excluded. 4.  Aorto-iliac atherosclerosis.   Original Report Authenticated By: Britta Mccreedy, M.D.   Ct Abdomen Pelvis W Contrast  07/06/2013   *RADIOLOGY REPORT*  Clinical Data:  Metastatic small cell cancer of the lung. Hypermetabolic right renal mass noted on recent PET CT.  CT CHEST, ABDOMEN AND PELVIS WITH CONTRAST  Technique:  Multidetector CT imaging of the chest, abdomen and pelvis was performed following the standard protocol during bolus administration of intravenous contrast.  Contrast: OMNIPAQUE IOHEXOL 300 MG/ML  SOLN  Comparison:  CT chest 04/27/2013 and nuclear medicine PET CT 05/21/2013  CT CHEST  Findings:  Mediastinum/soft tissues:  A subcarinal lymph node measures approximately 8 mm short axis (previously 8.8 mm on recent PET CT).  Low right paratracheal lymph node measures 9 mm AP diameter, stable.   Prevascular lymph node adjacent to the left  main pulmonary artery measures 8 mm short axis.  This is suspected to be smaller on today's examination was felt to be nearly contiguous with the left upper lobe mass on the prior PET-CT.  No hilar lymphadenopathy is detected.  No supraclavicular or axillary lymphadenopathy.  Negative for pleural or pericardial effusion. Normal heart size.  Atherosclerotic changes of the normal caliber abdominal aorta.  Lung windows:  The left upper lobe paramediastinal mass is significantly decreased in size.   Currently measures 2.0 x 2.0 cm axial diameter (previously 4.9 x 3.8 cm on PET CT of 05/21/2013). There is moderate centrilobular and moderate biapical pleuroparenchymal scarring.  No new pulmonary mass or nodules identified.  Thoracic  spine vertebral bodies are normal in height alignment.  No discrete bony abnormality is identified by CT.  IMPRESSION:  1.  Significant decrease in size of left upper lobe paramediastinal mass.  Now measures 2.0 x 2.0 cm greatest axial diameter. 2.  Mediastinal lymph nodes are within normal limits for size, as described above. 3.  Moderate centrilobular emphysema.  CT ABDOMEN AND PELVIS  Findings:  Extending laterally and posteriorly from the posterior cortex of the upper pole right kidney is a rounded mass measuring 11 x 10 mm in axial dimension.  This mass has a narrow connection with the renal parenchyma.  This mass enhances less than the adjacent renal cortex, with Hounsfield units of 40 on the initial series, and 48 on the delayed images.  No masses are seen in the left kidney.  There is no hydronephrosis of either kidney.  Both adrenal glands are normal.  The spleen, gallbladder, pancreas, and liver appear within normal limits.  The abdominal aorta is normal in caliber contains heavy calcified and noncalcified plaque.  There is also atherosclerotic change of the iliac vasculature bilaterally and the proximal femoral arteries.  Negative for aneurysm.  The stomach appears normal.  Small  bowel loops are normal in caliber wall thickness.  The appendix is normal.  The colon is normal in caliber.  Normal sized prostate gland with central calcification.  Gastrohepatic ligament lymph node measures approximately 7 mm.  No pathologically enlarged lymph nodes are identified in the abdomen or pelvis.  There is no ascites.  Bones:  Best seen on the sagittal projection is a 5 mm lucency in the inferior aspect of the L1 vertebral body, which is small bony metastasis cannot be excluded.  There is no evidence of fracture. Chronic bilateral pars defects at L5 noted, with approximately 7 mm anterolisthesis of L5 on S1.  IMPRESSION:  1.  10 mm exophytic right renal lesion.  This lesion was hypermetabolic on recent PET CT.  It enhances to a lesser degree than the adjacent renal cortex.  This could be a complex renal cyst or renal cell carcinoma.  If further evaluation is desired, MRI of the abdomen with without contrast should be considered. 2.  No pathologically enlarged lymph nodes seen.  A 7 mm gastrohepatic ligament lymph node is noted, and attention is suggested on follow-up. 3.  Small lucency in the L1 vertebral body, for which a small bony metastasis cannot be excluded. 4.  Aorto-iliac atherosclerosis.   Original Report Authenticated By: Britta Mccreedy, M.D.    ASSESSMENT AND PLAN: This is a very pleasant 62 years old white male with extensive stage small cell lung cancer currently undergoing systemic chemotherapy with carboplatin and etoposide status post 3 cycles. The patient also completed a course of palliative radiotherapy to the chest as well as whole brain irradiation. He has significant improvement in his disease based on the last CT scan results. The patient was discussed with and also seen by Dr. Arbutus Ped. He will proceed with cycle #4 of his systemic chemotherapy with carboplatin and etoposide with Neulasta support. He will continue with weekly labs as scheduled. He'll followup with Dr. Arbutus Ped in  3 weeks with a restaging CT scan of the chest, abdomen and pelvis with contrast to reevaluate his disease. He'll be scheduled for cycle #5 of his chemotherapy with carboplatin and etoposide with Neulasta support when he returns in 3 weeks.  Dayvin Aber E, PA-C   He was advised to call immediately if he has any concerning  symptoms in the interval. The patient voices understanding of current disease status and treatment options and is in agreement with the current care plan.  All questions were answered. The patient knows to call the clinic with any problems, questions or concerns. We can certainly see the patient much sooner if necessary.   ADDENDUM: Hematology/Oncology Attending: I have a face to face encounter with the patient. I recommended this care plan.the patient is feeling fine today with no specific complaints. We will proceed with cycle #4 of his treatment as scheduled. He would come back for follow up visit in 3 weeks with repeat CT scan of the chest, abdomen and pelvis for restaging of his disease.  Lajuana Matte., MD 08/16/2013

## 2013-08-11 NOTE — Patient Instructions (Signed)
Continue with weekly labs as scheduled Followup with Dr. Arbutus Ped in 3 weeks with a restaging CT scan of the chest, abdomen and pelvis to reevaluate your disease.

## 2013-08-11 NOTE — Patient Instructions (Signed)
[facility] Discharge Instructions for Patients Receiving Chemotherapy  Today you received the following chemotherapy agents Carboplatin/ VP-16  To help prevent nausea and vomiting after your treatment, we encourage you to take your nausea medication as directed   If you develop nausea and vomiting that is not controlled by your nausea medication, call the clinic.   BELOW ARE SYMPTOMS THAT SHOULD BE REPORTED IMMEDIATELY:  *FEVER GREATER THAN 100.5 F  *CHILLS WITH OR WITHOUT FEVER  NAUSEA AND VOMITING THAT IS NOT CONTROLLED WITH YOUR NAUSEA MEDICATION  *UNUSUAL SHORTNESS OF BREATH  *UNUSUAL BRUISING OR BLEEDING  TENDERNESS IN MOUTH AND THROAT WITH OR WITHOUT PRESENCE OF ULCERS  *URINARY PROBLEMS  *BOWEL PROBLEMS  UNUSUAL RASH Items with * indicate a potential emergency and should be followed up as soon as possible.  Feel free to call the clinic you have any questions or concerns. The clinic phone number is [phone].

## 2013-08-12 ENCOUNTER — Ambulatory Visit (HOSPITAL_BASED_OUTPATIENT_CLINIC_OR_DEPARTMENT_OTHER): Payer: Medicaid Other

## 2013-08-12 ENCOUNTER — Telehealth: Payer: Self-pay | Admitting: Internal Medicine

## 2013-08-12 VITALS — BP 102/61 | HR 71 | Temp 97.6°F | Resp 20

## 2013-08-12 DIAGNOSIS — C3410 Malignant neoplasm of upper lobe, unspecified bronchus or lung: Secondary | ICD-10-CM

## 2013-08-12 DIAGNOSIS — Z5111 Encounter for antineoplastic chemotherapy: Secondary | ICD-10-CM

## 2013-08-12 DIAGNOSIS — C7931 Secondary malignant neoplasm of brain: Secondary | ICD-10-CM

## 2013-08-12 DIAGNOSIS — C3490 Malignant neoplasm of unspecified part of unspecified bronchus or lung: Secondary | ICD-10-CM

## 2013-08-12 MED ORDER — DEXAMETHASONE SODIUM PHOSPHATE 10 MG/ML IJ SOLN
10.0000 mg | Freq: Once | INTRAMUSCULAR | Status: AC
  Administered 2013-08-12: 10 mg via INTRAVENOUS

## 2013-08-12 MED ORDER — SODIUM CHLORIDE 0.9 % IV SOLN
Freq: Once | INTRAVENOUS | Status: AC
  Administered 2013-08-12: 10:00:00 via INTRAVENOUS

## 2013-08-12 MED ORDER — ONDANSETRON 8 MG/50ML IVPB (CHCC)
8.0000 mg | Freq: Once | INTRAVENOUS | Status: AC
  Administered 2013-08-12: 8 mg via INTRAVENOUS

## 2013-08-12 MED ORDER — SODIUM CHLORIDE 0.9 % IV SOLN
120.0000 mg/m2 | Freq: Once | INTRAVENOUS | Status: AC
  Administered 2013-08-12: 230 mg via INTRAVENOUS
  Filled 2013-08-12: qty 11.5

## 2013-08-12 NOTE — Patient Instructions (Addendum)
[facility] Discharge Instructions for Patients Receiving Chemotherapy  Today you received the following chemotherapy agents:  Etoposide  To help prevent nausea and vomiting after your treatment, we encourage you to take your nausea medication as ordered per MD.   If you develop nausea and vomiting that is not controlled by your nausea medication, call the clinic.   BELOW ARE SYMPTOMS THAT SHOULD BE REPORTED IMMEDIATELY:  *FEVER GREATER THAN 100.5 F  *CHILLS WITH OR WITHOUT FEVER  NAUSEA AND VOMITING THAT IS NOT CONTROLLED WITH YOUR NAUSEA MEDICATION  *UNUSUAL SHORTNESS OF BREATH  *UNUSUAL BRUISING OR BLEEDING  TENDERNESS IN MOUTH AND THROAT WITH OR WITHOUT PRESENCE OF ULCERS  *URINARY PROBLEMS  *BOWEL PROBLEMS  UNUSUAL RASH Items with * indicate a potential emergency and should be followed up as soon as possible.  Feel free to call the clinic you have any questions or concerns. The clinic phone number is [phone].    

## 2013-08-12 NOTE — Telephone Encounter (Signed)
S/w pt confirming September appts. Pt went over dates he has on the schedule he picked up today and pt has the most current schedule and will get contrast tomorrow.

## 2013-08-13 ENCOUNTER — Ambulatory Visit (HOSPITAL_BASED_OUTPATIENT_CLINIC_OR_DEPARTMENT_OTHER): Payer: Medicaid Other

## 2013-08-13 ENCOUNTER — Ambulatory Visit: Payer: Self-pay

## 2013-08-13 VITALS — BP 109/66 | HR 78 | Temp 97.7°F | Resp 18

## 2013-08-13 DIAGNOSIS — Z5111 Encounter for antineoplastic chemotherapy: Secondary | ICD-10-CM

## 2013-08-13 DIAGNOSIS — C3410 Malignant neoplasm of upper lobe, unspecified bronchus or lung: Secondary | ICD-10-CM

## 2013-08-13 DIAGNOSIS — C3490 Malignant neoplasm of unspecified part of unspecified bronchus or lung: Secondary | ICD-10-CM

## 2013-08-13 DIAGNOSIS — C7931 Secondary malignant neoplasm of brain: Secondary | ICD-10-CM

## 2013-08-13 MED ORDER — DEXAMETHASONE SODIUM PHOSPHATE 10 MG/ML IJ SOLN
10.0000 mg | Freq: Once | INTRAMUSCULAR | Status: AC
  Administered 2013-08-13: 10 mg via INTRAVENOUS

## 2013-08-13 MED ORDER — SODIUM CHLORIDE 0.9 % IV SOLN
Freq: Once | INTRAVENOUS | Status: AC
  Administered 2013-08-13: 09:00:00 via INTRAVENOUS

## 2013-08-13 MED ORDER — ONDANSETRON 8 MG/50ML IVPB (CHCC)
8.0000 mg | Freq: Once | INTRAVENOUS | Status: AC
  Administered 2013-08-13: 8 mg via INTRAVENOUS

## 2013-08-13 MED ORDER — ETOPOSIDE CHEMO INJECTION 1 GM/50ML
120.0000 mg/m2 | Freq: Once | INTRAVENOUS | Status: AC
  Administered 2013-08-13: 230 mg via INTRAVENOUS
  Filled 2013-08-13: qty 11.5

## 2013-08-13 NOTE — Patient Instructions (Addendum)
[facility] Discharge Instructions for Patients Receiving Chemotherapy  Today you received the following chemotherapy agents :  Etoposide.  To help prevent nausea and vomiting after your treatment, we encourage you to take your nausea medication as instructed by your physician.   If you develop nausea and vomiting that is not controlled by your nausea medication, call the clinic.   BELOW ARE SYMPTOMS THAT SHOULD BE REPORTED IMMEDIATELY:  *FEVER GREATER THAN 100.5 F  *CHILLS WITH OR WITHOUT FEVER  NAUSEA AND VOMITING THAT IS NOT CONTROLLED WITH YOUR NAUSEA MEDICATION  *UNUSUAL SHORTNESS OF BREATH  *UNUSUAL BRUISING OR BLEEDING  TENDERNESS IN MOUTH AND THROAT WITH OR WITHOUT PRESENCE OF ULCERS  *URINARY PROBLEMS  *BOWEL PROBLEMS  UNUSUAL RASH Items with * indicate a potential emergency and should be followed up as soon as possible.  Feel free to call the clinic you have any questions or concerns. The clinic phone number is [phone].    

## 2013-08-14 ENCOUNTER — Ambulatory Visit (HOSPITAL_BASED_OUTPATIENT_CLINIC_OR_DEPARTMENT_OTHER): Payer: Medicaid Other

## 2013-08-14 VITALS — BP 104/63 | HR 64 | Temp 98.0°F

## 2013-08-14 DIAGNOSIS — C3490 Malignant neoplasm of unspecified part of unspecified bronchus or lung: Secondary | ICD-10-CM

## 2013-08-14 DIAGNOSIS — Z5189 Encounter for other specified aftercare: Secondary | ICD-10-CM

## 2013-08-14 DIAGNOSIS — C3410 Malignant neoplasm of upper lobe, unspecified bronchus or lung: Secondary | ICD-10-CM

## 2013-08-14 MED ORDER — PEGFILGRASTIM INJECTION 6 MG/0.6ML
6.0000 mg | Freq: Once | SUBCUTANEOUS | Status: AC
  Administered 2013-08-14: 6 mg via SUBCUTANEOUS
  Filled 2013-08-14: qty 0.6

## 2013-08-16 ENCOUNTER — Encounter: Payer: Self-pay | Admitting: Physician Assistant

## 2013-08-18 ENCOUNTER — Other Ambulatory Visit (HOSPITAL_BASED_OUTPATIENT_CLINIC_OR_DEPARTMENT_OTHER): Payer: Medicaid Other

## 2013-08-18 DIAGNOSIS — C3410 Malignant neoplasm of upper lobe, unspecified bronchus or lung: Secondary | ICD-10-CM

## 2013-08-18 LAB — COMPREHENSIVE METABOLIC PANEL (CC13)
Albumin: 3.6 g/dL (ref 3.5–5.0)
BUN: 34.3 mg/dL — ABNORMAL HIGH (ref 7.0–26.0)
Calcium: 9.5 mg/dL (ref 8.4–10.4)
Chloride: 107 mEq/L (ref 98–109)
Glucose: 91 mg/dl (ref 70–140)
Potassium: 4.3 mEq/L (ref 3.5–5.1)
Sodium: 141 mEq/L (ref 136–145)
Total Protein: 6.6 g/dL (ref 6.4–8.3)

## 2013-08-18 LAB — CBC WITH DIFFERENTIAL/PLATELET
Basophils Absolute: 0.1 10*3/uL (ref 0.0–0.1)
Eosinophils Absolute: 0 10*3/uL (ref 0.0–0.5)
HGB: 9.9 g/dL — ABNORMAL LOW (ref 13.0–17.1)
MONO#: 0.5 10*3/uL (ref 0.1–0.9)
NEUT#: 3.9 10*3/uL (ref 1.5–6.5)
RBC: 3.14 10*6/uL — ABNORMAL LOW (ref 4.20–5.82)
RDW: 17 % — ABNORMAL HIGH (ref 11.0–14.6)
WBC: 5.2 10*3/uL (ref 4.0–10.3)
lymph#: 0.8 10*3/uL — ABNORMAL LOW (ref 0.9–3.3)

## 2013-08-24 ENCOUNTER — Other Ambulatory Visit (HOSPITAL_BASED_OUTPATIENT_CLINIC_OR_DEPARTMENT_OTHER): Payer: Medicaid Other | Admitting: Lab

## 2013-08-24 DIAGNOSIS — C3410 Malignant neoplasm of upper lobe, unspecified bronchus or lung: Secondary | ICD-10-CM

## 2013-08-24 LAB — CBC WITH DIFFERENTIAL/PLATELET
Basophils Absolute: 0.2 10*3/uL — ABNORMAL HIGH (ref 0.0–0.1)
EOS%: 0.2 % (ref 0.0–7.0)
Eosinophils Absolute: 0 10*3/uL (ref 0.0–0.5)
HGB: 11.1 g/dL — ABNORMAL LOW (ref 13.0–17.1)
MCH: 30.9 pg (ref 27.2–33.4)
MONO%: 5.8 % (ref 0.0–14.0)
NEUT#: 18.2 10*3/uL — ABNORMAL HIGH (ref 1.5–6.5)
RBC: 3.59 10*6/uL — ABNORMAL LOW (ref 4.20–5.82)
RDW: 18.3 % — ABNORMAL HIGH (ref 11.0–14.6)
lymph#: 1.3 10*3/uL (ref 0.9–3.3)

## 2013-08-24 LAB — COMPREHENSIVE METABOLIC PANEL (CC13)
ALT: 24 U/L (ref 0–55)
AST: 20 U/L (ref 5–34)
Albumin: 3.3 g/dL — ABNORMAL LOW (ref 3.5–5.0)
Alkaline Phosphatase: 139 U/L (ref 40–150)
BUN: 13.7 mg/dL (ref 7.0–26.0)
CO2: 30 meq/L — ABNORMAL HIGH (ref 22–29)
Calcium: 9.3 mg/dL (ref 8.4–10.4)
Chloride: 104 meq/L (ref 98–109)
Creatinine: 0.9 mg/dL (ref 0.7–1.3)
Glucose: 145 mg/dL — ABNORMAL HIGH (ref 70–140)
Potassium: 3.7 meq/L (ref 3.5–5.1)
Sodium: 143 meq/L (ref 136–145)
Total Bilirubin: 0.2 mg/dL (ref 0.20–1.20)
Total Protein: 6.5 g/dL (ref 6.4–8.3)

## 2013-08-28 ENCOUNTER — Other Ambulatory Visit: Payer: Self-pay | Admitting: Internal Medicine

## 2013-08-28 ENCOUNTER — Ambulatory Visit (HOSPITAL_COMMUNITY)
Admission: RE | Admit: 2013-08-28 | Discharge: 2013-08-28 | Disposition: A | Discharge status: Home / Self Care | Payer: Medicaid Other | Source: Ambulatory Visit | Attending: Physician Assistant | Admitting: Physician Assistant

## 2013-08-28 ENCOUNTER — Encounter (HOSPITAL_COMMUNITY): Payer: Self-pay

## 2013-08-28 DIAGNOSIS — C3490 Malignant neoplasm of unspecified part of unspecified bronchus or lung: Secondary | ICD-10-CM | POA: Insufficient documentation

## 2013-08-28 DIAGNOSIS — Z9221 Personal history of antineoplastic chemotherapy: Secondary | ICD-10-CM | POA: Insufficient documentation

## 2013-08-28 DIAGNOSIS — C7951 Secondary malignant neoplasm of bone: Secondary | ICD-10-CM | POA: Insufficient documentation

## 2013-08-28 HISTORY — DX: Malignant (primary) neoplasm, unspecified: C80.1

## 2013-08-28 MED ORDER — IOHEXOL 300 MG/ML  SOLN
100.0000 mL | Freq: Once | INTRAMUSCULAR | Status: AC | PRN
  Administered 2013-08-28: 100 mL via INTRAVENOUS

## 2013-08-31 ENCOUNTER — Ambulatory Visit (HOSPITAL_BASED_OUTPATIENT_CLINIC_OR_DEPARTMENT_OTHER): Payer: Medicaid Other | Admitting: Internal Medicine

## 2013-08-31 ENCOUNTER — Encounter: Payer: Self-pay | Admitting: Internal Medicine

## 2013-08-31 ENCOUNTER — Ambulatory Visit (HOSPITAL_BASED_OUTPATIENT_CLINIC_OR_DEPARTMENT_OTHER): Payer: Medicaid Other

## 2013-08-31 ENCOUNTER — Other Ambulatory Visit: Payer: Self-pay | Admitting: Medical Oncology

## 2013-08-31 ENCOUNTER — Other Ambulatory Visit (HOSPITAL_BASED_OUTPATIENT_CLINIC_OR_DEPARTMENT_OTHER): Payer: Medicaid Other | Admitting: Lab

## 2013-08-31 VITALS — BP 97/68 | HR 88 | Temp 96.9°F | Resp 20 | Ht 68.5 in | Wt 145.2 lb

## 2013-08-31 DIAGNOSIS — C3410 Malignant neoplasm of upper lobe, unspecified bronchus or lung: Secondary | ICD-10-CM

## 2013-08-31 DIAGNOSIS — C3490 Malignant neoplasm of unspecified part of unspecified bronchus or lung: Secondary | ICD-10-CM

## 2013-08-31 DIAGNOSIS — C7931 Secondary malignant neoplasm of brain: Secondary | ICD-10-CM

## 2013-08-31 DIAGNOSIS — R52 Pain, unspecified: Secondary | ICD-10-CM

## 2013-08-31 DIAGNOSIS — R110 Nausea: Secondary | ICD-10-CM

## 2013-08-31 DIAGNOSIS — Z5111 Encounter for antineoplastic chemotherapy: Secondary | ICD-10-CM

## 2013-08-31 LAB — CBC WITH DIFFERENTIAL/PLATELET
Basophils Absolute: 0.1 10*3/uL (ref 0.0–0.1)
EOS%: 0.4 % (ref 0.0–7.0)
Eosinophils Absolute: 0.1 10*3/uL (ref 0.0–0.5)
HCT: 41.8 % (ref 38.4–49.9)
HGB: 14.2 g/dL (ref 13.0–17.1)
MCH: 31.4 pg (ref 27.2–33.4)
MONO#: 1.5 10*3/uL — ABNORMAL HIGH (ref 0.1–0.9)
NEUT#: 11 10*3/uL — ABNORMAL HIGH (ref 1.5–6.5)
NEUT%: 76 % — ABNORMAL HIGH (ref 39.0–75.0)
RDW: 17.4 % — ABNORMAL HIGH (ref 11.0–14.6)
WBC: 14.5 10*3/uL — ABNORMAL HIGH (ref 4.0–10.3)
lymph#: 1.8 10*3/uL (ref 0.9–3.3)

## 2013-08-31 LAB — COMPREHENSIVE METABOLIC PANEL (CC13)
AST: 18 U/L (ref 5–34)
Albumin: 3.8 g/dL (ref 3.5–5.0)
Alkaline Phosphatase: 113 U/L (ref 40–150)
BUN: 17.8 mg/dL (ref 7.0–26.0)
Calcium: 9.8 mg/dL (ref 8.4–10.4)
Chloride: 102 mEq/L (ref 98–109)
Glucose: 123 mg/dl (ref 70–140)
Potassium: 4.1 mEq/L (ref 3.5–5.1)
Sodium: 138 mEq/L (ref 136–145)
Total Protein: 7.4 g/dL (ref 6.4–8.3)

## 2013-08-31 MED ORDER — DEXAMETHASONE SODIUM PHOSPHATE 20 MG/5ML IJ SOLN
20.0000 mg | Freq: Once | INTRAMUSCULAR | Status: AC
  Administered 2013-08-31: 20 mg via INTRAVENOUS

## 2013-08-31 MED ORDER — CARBOPLATIN CHEMO INJECTION 600 MG/60ML
584.5000 mg | Freq: Once | INTRAVENOUS | Status: AC
  Administered 2013-08-31: 580 mg via INTRAVENOUS
  Filled 2013-08-31: qty 58

## 2013-08-31 MED ORDER — SODIUM CHLORIDE 0.9 % IV SOLN
120.0000 mg/m2 | Freq: Once | INTRAVENOUS | Status: AC
  Administered 2013-08-31: 230 mg via INTRAVENOUS
  Filled 2013-08-31: qty 11.5

## 2013-08-31 MED ORDER — ONDANSETRON 16 MG/50ML IVPB (CHCC)
16.0000 mg | Freq: Once | INTRAVENOUS | Status: AC
  Administered 2013-08-31: 16 mg via INTRAVENOUS

## 2013-08-31 MED ORDER — SODIUM CHLORIDE 0.9 % IV SOLN
Freq: Once | INTRAVENOUS | Status: AC
  Administered 2013-08-31: 10:00:00 via INTRAVENOUS

## 2013-08-31 MED ORDER — ONDANSETRON 16 MG/50ML IVPB (CHCC)
INTRAVENOUS | Status: AC
  Filled 2013-08-31: qty 16

## 2013-08-31 MED ORDER — PROMETHAZINE HCL 25 MG PO TABS: 25.0000 mg | ORAL_TABLET | Freq: Four times a day (QID) | ORAL | Status: DC | PRN

## 2013-08-31 MED ORDER — HYDROCODONE-ACETAMINOPHEN 5-325 MG PO TABS: 1.0000 | ORAL_TABLET | Freq: Four times a day (QID) | ORAL | Status: DC | PRN

## 2013-08-31 MED ORDER — DEXAMETHASONE SODIUM PHOSPHATE 20 MG/5ML IJ SOLN
INTRAMUSCULAR | Status: AC
  Filled 2013-08-31: qty 5

## 2013-08-31 NOTE — Patient Instructions (Signed)
[facility] Discharge Instructions for Patients Receiving Chemotherapy  Today you received the following chemotherapy agents Etoposide and Carboplatin.  To help prevent nausea and vomiting after your treatment, we encourage you to take your nausea medication Zofran 8 mg every 12 hours or Phenergan 25 mg every 6 hours as needed for nausea. If you develop nausea and vomiting that is not controlled by your nausea medication, call the clinic.   BELOW ARE SYMPTOMS THAT SHOULD BE REPORTED IMMEDIATELY:  *FEVER GREATER THAN 100.5 F  *CHILLS WITH OR WITHOUT FEVER  NAUSEA AND VOMITING THAT IS NOT CONTROLLED WITH YOUR NAUSEA MEDICATION  *UNUSUAL SHORTNESS OF BREATH  *UNUSUAL BRUISING OR BLEEDING  TENDERNESS IN MOUTH AND THROAT WITH OR WITHOUT PRESENCE OF ULCERS  *URINARY PROBLEMS  *BOWEL PROBLEMS  UNUSUAL RASH Items with * indicate a potential emergency and should be followed up as soon as possible.  Feel free to call the clinic you have any questions or concerns. The clinic phone number is [phone].

## 2013-08-31 NOTE — Patient Instructions (Signed)
CURRENT THERAPY: Systemic chemotherapy with carboplatin for AUC of 5 on day 1 and etoposide 120 mg/M2 on days 1, 2 and 3 with Neulasta support on day 4, status post 4 cycles. First cycle was started on 05/25/2013.  CHEMOTHERAPY INTENT: Palliative  CURRENT # OF CHEMOTHERAPY CYCLES: 4  CURRENT ANTIEMETICS: Zofran, dexamethasone and Compazine  CURRENT SMOKING STATUS: Current smoker and strongly advised to quit smoking and offered smoke cessation program  ORAL CHEMOTHERAPY AND CONSENT: None  CURRENT BISPHOSPHONATES USE: None  PAIN MANAGEMENT: Well-managed with Percocet when necessary  NARCOTICS INDUCED CONSTIPATION: No constipation but has a stool softener at home  LIVING WILL AND CODE STATUS: Discussed with the patient and he is still Full code.

## 2013-08-31 NOTE — Progress Notes (Signed)
[facility] [phone]   [phone]  OFFICE PROGRESS NOTE  No PCP Per Patient [address] [place] [address]  DIAGNOSIS AND STAGE: Extensive stage small cell lung cancer diagnosed in May of 2014   PRIOR THERAPY: whole Brain irradiation as well as palliative radiotherapy to the chest mass.   CURRENT THERAPY: Systemic chemotherapy with carboplatin for AUC of 5 on day 1 and etoposide 120 mg/M2 on days 1, 2 and 3 with Neulasta support on day 4, status post 4 cycles. First cycle was started on 05/25/2013.   CHEMOTHERAPY INTENT: Palliative  CURRENT # OF CHEMOTHERAPY CYCLES: 4 CURRENT ANTIEMETICS: Zofran, dexamethasone and Compazine  CURRENT SMOKING STATUS: Current smoker and strongly advised to quit smoking and offered smoke cessation program  ORAL CHEMOTHERAPY AND CONSENT: None  CURRENT BISPHOSPHONATES USE: None  PAIN MANAGEMENT: Well-managed with Percocet when necessary  NARCOTICS INDUCED CONSTIPATION: No constipation but has a stool softener at home  LIVING WILL AND CODE STATUS: Discussed with the patient and he is still Full code.   INTERVAL HISTORY: Jacob Webb 62 y.o. male returns to the clinic today for followup visit. The patient is feeling fine today with no specific complaints except for occasional nausea and he takes Phenergan. He denied having any significant chest pain but continues to have shortness breath with exertion with no cough or hemoptysis. He denied having any fever or chills. The patient denied having any significant weight loss or night sweats. He tolerated the last cycle of his systemic chemotherapy fairly well with no significant adverse effects except for the mild nausea. The patient had repeat CT scan of the chest, abdomen and pelvis performed recently and he is here for evaluation and discussion of his scan results.  MEDICAL HISTORY: Past Medical History  Diagnosis Date  . Cancer     lung ca dx'd 04/2013     ALLERGIES:  has No Known Allergies.  MEDICATIONS:  Current Outpatient Prescriptions  Medication Sig Dispense Refill  . HYDROcodone-acetaminophen (NORCO/VICODIN) 5-325 MG per tablet TAKE 1 TABLET BY MOUTH EVERY 6 HOURS AS NEEDED - Per Dr. Mitzi Hansen on 07/23/2013, change to take 1-2 tablets by mouth every 4-6 hours as needed.  Not to exceed 10 tablets in 24 hours.      Marland Kitchen omeprazole (PRILOSEC) 40 MG capsule 1 po daily, 30-47minutes prior to first meal of the day.  30 capsule  1  . ondansetron (ZOFRAN) 8 MG tablet Take 1 tablet (8 mg total) by mouth every 12 (twelve) hours as needed for nausea.  30 tablet  0  . promethazine (PHENERGAN) 25 MG tablet Take 1 tablet (25 mg total) by mouth every 6 (six) hours as needed for nausea.  30 tablet  1   No current facility-administered medications for this visit.   REVIEW OF SYSTEMS:  Constitutional: positive for fatigue Eyes: negative Ears, nose, mouth, throat, and face: negative Respiratory: positive for dyspnea on exertion Cardiovascular: negative Gastrointestinal: negative Genitourinary:negative Integument/breast: negative Hematologic/lymphatic: negative Musculoskeletal:negative Neurological: negative Behavioral/Psych: negative Endocrine: negative Allergic/Immunologic: negative   PHYSICAL EXAMINATION: General appearance: alert, cooperative, fatigued and no distress Head: Normocephalic, without obvious abnormality, atraumatic Neck: no adenopathy Lymph nodes: Cervical, supraclavicular, and axillary nodes normal. Resp: clear to auscultation bilaterally Back: symmetric, no curvature. ROM normal. No CVA tenderness. Cardio: regular rate and rhythm, S1, S2 normal, no murmur, click, rub or gallop GI: soft, non-tender; bowel sounds normal; no masses,  no organomegaly Extremities: extremities normal, atraumatic, no cyanosis  or edema Neurologic: Alert and oriented X 3, normal strength and tone. Normal symmetric reflexes. Normal coordination and  gait  ECOG PERFORMANCE STATUS: 1 - Symptomatic but completely ambulatory  Blood pressure 97/68, pulse 88, temperature 96.9 F (36.1 C), temperature source Oral, resp. rate 20, height 5' 8.5" (1.74 m), weight 145 lb 3.2 oz (65.862 kg).  LABORATORY DATA: Lab Results  Component Value Date   WBC 14.5* 08/31/2013   HGB 14.2 08/31/2013   HCT 41.8 08/31/2013   MCV 92.5 08/31/2013   PLT 195 08/31/2013      Chemistry      Component Value Date/Time   NA 143 08/24/2013 1051   NA 136 06/22/2013 1622   K 3.7 08/24/2013 1051   K 3.8 06/22/2013 1622   CL 97 06/22/2013 1622   CL 105 06/08/2013 1009   CO2 30* 08/24/2013 1051   CO2 31 06/22/2013 1622   BUN 13.7 08/24/2013 1051   BUN 21 06/22/2013 1622   CREATININE 0.9 08/24/2013 1051   CREATININE 0.92 06/22/2013 1622      Component Value Date/Time   CALCIUM 9.3 08/24/2013 1051   CALCIUM 10.0 06/22/2013 1622   ALKPHOS 139 08/24/2013 1051   ALKPHOS 152* 06/22/2013 1622   AST 20 08/24/2013 1051   AST 21 06/22/2013 1622   ALT 24 08/24/2013 1051   ALT 27 06/22/2013 1622   BILITOT 0.20 08/24/2013 1051   BILITOT 0.6 06/22/2013 1622       RADIOGRAPHIC STUDIES: Ct Chest W Contrast  08/28/2013   CLINICAL DATA:  Malignant neoplasm of the lung. Small cell lung cancer  EXAM: CT CHEST, ABDOMEN, AND PELVIS WITH CONTRAST  TECHNIQUE: Multidetector CT imaging of the chest, abdomen and pelvis was performed following the standard protocol during bolus administration of intravenous contrast.  CONTRAST:  OMNIPAQUE IOHEXOL 300 MG/ML  SOLN  FINDINGS: CT CHEST FINDINGS  No axillary or supraclavicular lymphadenopathy. No mediastinal or hilar lymphadenopathy. No pericardial fluid. Esophagus is mildly thickened. No central pulmonary embolism.  Review of the lung parenchyma demonstrates a continued decrease in size of the left upper lobe para mediastinal mass which now is nodular size measuring 12 mm x 11 mm compared to 19 by 17 mm on most recent prior (image 15, series 4). There is biapical pleural  parenchymal nodular thickening which is not changed. Centrilobular emphysema is noted.  CT ABDOMEN AND PELVIS FINDINGS  No focal hepatic lesion. The gallbladder, pancreas, spleen, and adrenal glands are normal. The round lesion adjacent to the right kidney measures 11 mm (image 63, series 2) decreased in size from 12 mm on CT of 07/06/2013 and 14 mm on PET-CT scan 05/21/2013.  Stomach, small bowel, and colon are normal. Abdominal aorta is normal caliber. No retroperitoneal periportal lymphadenopathy.  Review of the bone windows demonstrate no aggressive osseous lesions. Sclerotic lesions within the T1 vertebral body on the right is unchanged. No new aggressive lesions.  IMPRESSION: CT CHEST IMPRESSION  1. Continued decrease in size of left upper lobe mass which is now nodular size.  2. No mediastinal lymphadenopathy.  CT ABDOMEN AND PELVIS IMPRESSION  1. Decrease in size of nodule exophytic / adjacent to the right kidney per. Legrand Rams this to represent a metastatic implant to the retroperitoneum rather than a true renal lesion with a decrease in size corresponding to chemotherapy. Recommend attention on follow-up.  2. Stable sclerotic metastasis at T1.   Electronically Signed   By: Genevive Bi M.D.   On: 08/28/2013 14:13  ASSESSMENT AND PLAN:  1) extensive stage small cell lung cancer: Currently undergoing systemic chemotherapy with carboplatin and etoposide with significant improvement in his disease. I discussed the scan results and showed the images to the patient today. I recommended for him to proceed with one more cycle of chemotherapy with the same regimen. I would see him back for followup visit in 3 months with repeat CT scan of the chest, abdomen and pelvis for restaging of his disease. 2) nausea: The patient was given a refill of Phenergan. 3) pain management: He was given a refill of Norco. The patient was advised to call immediately if has any concerning symptoms in the interval.  The patient  voices understanding of current disease status and treatment options and is in agreement with the current care plan.  All questions were answered. The patient knows to call the clinic with any problems, questions or concerns. We can certainly see the patient much sooner if necessary.  I spent 15 minutes counseling the patient face to face. The total time spent in the appointment was 25 minutes.

## 2013-09-01 ENCOUNTER — Ambulatory Visit (HOSPITAL_BASED_OUTPATIENT_CLINIC_OR_DEPARTMENT_OTHER): Payer: Medicaid Other

## 2013-09-01 VITALS — BP 100/59 | HR 75 | Temp 97.7°F | Resp 20

## 2013-09-01 DIAGNOSIS — C7931 Secondary malignant neoplasm of brain: Secondary | ICD-10-CM

## 2013-09-01 DIAGNOSIS — Z5111 Encounter for antineoplastic chemotherapy: Secondary | ICD-10-CM

## 2013-09-01 DIAGNOSIS — C3410 Malignant neoplasm of upper lobe, unspecified bronchus or lung: Secondary | ICD-10-CM

## 2013-09-01 DIAGNOSIS — C3490 Malignant neoplasm of unspecified part of unspecified bronchus or lung: Secondary | ICD-10-CM

## 2013-09-01 MED ORDER — DEXAMETHASONE SODIUM PHOSPHATE 10 MG/ML IJ SOLN
INTRAMUSCULAR | Status: AC
  Filled 2013-09-01: qty 1

## 2013-09-01 MED ORDER — ONDANSETRON 8 MG/50ML IVPB (CHCC)
8.0000 mg | Freq: Once | INTRAVENOUS | Status: AC
  Administered 2013-09-01: 8 mg via INTRAVENOUS

## 2013-09-01 MED ORDER — SODIUM CHLORIDE 0.9 % IV SOLN
120.0000 mg/m2 | Freq: Once | INTRAVENOUS | Status: AC
  Administered 2013-09-01: 230 mg via INTRAVENOUS
  Filled 2013-09-01: qty 11.5

## 2013-09-01 MED ORDER — SODIUM CHLORIDE 0.9 % IV SOLN
Freq: Once | INTRAVENOUS | Status: AC
  Administered 2013-09-01: 15:00:00 via INTRAVENOUS

## 2013-09-01 MED ORDER — DEXAMETHASONE SODIUM PHOSPHATE 10 MG/ML IJ SOLN
10.0000 mg | Freq: Once | INTRAMUSCULAR | Status: AC
  Administered 2013-09-01: 10 mg via INTRAVENOUS

## 2013-09-01 MED ORDER — ONDANSETRON 8 MG/NS 50 ML IVPB
INTRAVENOUS | Status: AC
  Filled 2013-09-01: qty 8

## 2013-09-01 NOTE — Patient Instructions (Addendum)
[facility] Discharge Instructions for Patients Receiving Chemotherapy  Today you received the following chemotherapy agents: Etoposide.  To help prevent nausea and vomiting after your treatment, we encourage you to take your nausea medication as prescribed.   If you develop nausea and vomiting that is not controlled by your nausea medication, call the clinic.   BELOW ARE SYMPTOMS THAT SHOULD BE REPORTED IMMEDIATELY:  *FEVER GREATER THAN 100.5 F  *CHILLS WITH OR WITHOUT FEVER  NAUSEA AND VOMITING THAT IS NOT CONTROLLED WITH YOUR NAUSEA MEDICATION  *UNUSUAL SHORTNESS OF BREATH  *UNUSUAL BRUISING OR BLEEDING  TENDERNESS IN MOUTH AND THROAT WITH OR WITHOUT PRESENCE OF ULCERS  *URINARY PROBLEMS  *BOWEL PROBLEMS  UNUSUAL RASH Items with * indicate a potential emergency and should be followed up as soon as possible.  Feel free to call the clinic you have any questions or concerns. The clinic phone number is [phone].    

## 2013-09-02 ENCOUNTER — Telehealth: Payer: Self-pay | Admitting: Internal Medicine

## 2013-09-02 ENCOUNTER — Ambulatory Visit (HOSPITAL_BASED_OUTPATIENT_CLINIC_OR_DEPARTMENT_OTHER): Payer: Medicaid Other

## 2013-09-02 DIAGNOSIS — C3410 Malignant neoplasm of upper lobe, unspecified bronchus or lung: Secondary | ICD-10-CM

## 2013-09-02 DIAGNOSIS — C3490 Malignant neoplasm of unspecified part of unspecified bronchus or lung: Secondary | ICD-10-CM

## 2013-09-02 DIAGNOSIS — Z5111 Encounter for antineoplastic chemotherapy: Secondary | ICD-10-CM

## 2013-09-02 MED ORDER — DEXAMETHASONE SODIUM PHOSPHATE 10 MG/ML IJ SOLN
INTRAMUSCULAR | Status: AC
  Filled 2013-09-02: qty 1

## 2013-09-02 MED ORDER — ETOPOSIDE CHEMO INJECTION 1 GM/50ML
120.0000 mg/m2 | Freq: Once | INTRAVENOUS | Status: AC
  Administered 2013-09-02: 230 mg via INTRAVENOUS
  Filled 2013-09-02: qty 11.5

## 2013-09-02 MED ORDER — DEXAMETHASONE SODIUM PHOSPHATE 10 MG/ML IJ SOLN
10.0000 mg | Freq: Once | INTRAMUSCULAR | Status: AC
  Administered 2013-09-02: 10 mg via INTRAVENOUS

## 2013-09-02 MED ORDER — ONDANSETRON 8 MG/50ML IVPB (CHCC)
8.0000 mg | Freq: Once | INTRAVENOUS | Status: AC
  Administered 2013-09-02: 8 mg via INTRAVENOUS

## 2013-09-02 MED ORDER — ONDANSETRON 8 MG/NS 50 ML IVPB
INTRAVENOUS | Status: AC
  Filled 2013-09-02: qty 8

## 2013-09-02 MED ORDER — SODIUM CHLORIDE 0.9 % IV SOLN
Freq: Once | INTRAVENOUS | Status: AC
  Administered 2013-09-02: 14:00:00 via INTRAVENOUS

## 2013-09-02 NOTE — Patient Instructions (Addendum)
[facility] Discharge Instructions for Patients Receiving Chemotherapy  Today you received the following chemotherapy agents: Etoposide.  To help prevent nausea and vomiting after your treatment, we encourage you to take your nausea medication as prescribed.   If you develop nausea and vomiting that is not controlled by your nausea medication, call the clinic.   BELOW ARE SYMPTOMS THAT SHOULD BE REPORTED IMMEDIATELY:  *FEVER GREATER THAN 100.5 F  *CHILLS WITH OR WITHOUT FEVER  NAUSEA AND VOMITING THAT IS NOT CONTROLLED WITH YOUR NAUSEA MEDICATION  *UNUSUAL SHORTNESS OF BREATH  *UNUSUAL BRUISING OR BLEEDING  TENDERNESS IN MOUTH AND THROAT WITH OR WITHOUT PRESENCE OF ULCERS  *URINARY PROBLEMS  *BOWEL PROBLEMS  UNUSUAL RASH Items with * indicate a potential emergency and should be followed up as soon as possible.  Feel free to call the clinic you have any questions or concerns. The clinic phone number is [phone].    

## 2013-09-03 ENCOUNTER — Ambulatory Visit (HOSPITAL_BASED_OUTPATIENT_CLINIC_OR_DEPARTMENT_OTHER): Payer: Medicaid Other

## 2013-09-03 VITALS — BP 100/62 | HR 76 | Temp 97.6°F

## 2013-09-03 DIAGNOSIS — Z5189 Encounter for other specified aftercare: Secondary | ICD-10-CM

## 2013-09-03 DIAGNOSIS — C3410 Malignant neoplasm of upper lobe, unspecified bronchus or lung: Secondary | ICD-10-CM

## 2013-09-03 DIAGNOSIS — C3490 Malignant neoplasm of unspecified part of unspecified bronchus or lung: Secondary | ICD-10-CM

## 2013-09-03 MED ORDER — PEGFILGRASTIM INJECTION 6 MG/0.6ML
6.0000 mg | Freq: Once | SUBCUTANEOUS | Status: AC
  Administered 2013-09-03: 6 mg via SUBCUTANEOUS
  Filled 2013-09-03: qty 0.6

## 2013-09-07 ENCOUNTER — Other Ambulatory Visit: Payer: Medicaid Other

## 2013-09-09 ENCOUNTER — Emergency Department (HOSPITAL_COMMUNITY)
Admission: EM | Admit: 2013-09-09 | Discharge: 2013-09-09 | Discharge status: Against Medical Advice | Payer: Medicaid Other | Attending: Emergency Medicine | Admitting: Emergency Medicine

## 2013-09-09 ENCOUNTER — Telehealth: Payer: Self-pay | Admitting: Internal Medicine

## 2013-09-09 ENCOUNTER — Ambulatory Visit: Payer: Medicaid Other | Admitting: Lab

## 2013-09-09 ENCOUNTER — Encounter (HOSPITAL_COMMUNITY): Payer: Self-pay | Admitting: Emergency Medicine

## 2013-09-09 DIAGNOSIS — R5381 Other malaise: Secondary | ICD-10-CM | POA: Insufficient documentation

## 2013-09-09 DIAGNOSIS — R531 Weakness: Secondary | ICD-10-CM

## 2013-09-09 DIAGNOSIS — Z79899 Other long term (current) drug therapy: Secondary | ICD-10-CM | POA: Insufficient documentation

## 2013-09-09 DIAGNOSIS — F17200 Nicotine dependence, unspecified, uncomplicated: Secondary | ICD-10-CM | POA: Insufficient documentation

## 2013-09-09 DIAGNOSIS — C3490 Malignant neoplasm of unspecified part of unspecified bronchus or lung: Secondary | ICD-10-CM | POA: Insufficient documentation

## 2013-09-09 NOTE — Telephone Encounter (Signed)
pt came in and needed to r/S missed appt...done

## 2013-09-09 NOTE — ED Notes (Signed)
Per EMS-history of cancer-since being diagnosed he has become weak-has been trying to get to cancer but has not had a ride-called EMS, states he needs blood work and a shot

## 2013-09-09 NOTE — ED Provider Notes (Signed)
CSN: [id number]     Arrival date & time 09/09/13  4401 History   First MD Initiated Contact with Patient 09/09/13 [phone]     Chief Complaint  Patient presents with  . Weakness   (Consider location/radiation/quality/duration/timing/severity/associated sxs/prior Treatment) HPI Patient presents via EMS and it is hard to delineate why he is in the emergency department. He relates he was diagnosed with lung cancer 5-6 months ago. He states he last had chemotherapy a week ago. He states he did not have surgery, he has had radiation and chemotherapy. When asked why he is here patient states " I'm 7% on the scale", when asked what scale he was talking about he states it was the scale they use in oncology. When I asked him what physical problem he is having today he gets very agitated. He states he needs blood work. He states he "feels bad" however he will not tell me how he feels bad. He denies nausea, vomiting, diarrhea, chest pain, shortness of breath at this time, but states he does have dyspnea on exertion which is old, he denies cough, fevers, abdominal pain. He states he feels weak and dizzy, and when I ask him more about that he states "I have cancer". Patient is very agitated. He states he's been treated like "I have the money tree in the back yard". He states he has no money, he has no gas. He becomes very angry with me when I did not know that he ran out of gas driving to the hospital yesterday. He states "you people don't understand, I'm 37, not 21"patient then states "I just need to get to the cancer Center." He states he was supposed to be seen 2 days ago to have blood work done. Patient presents today via EMS.  PCP Dr Arbutus Ped  Past Medical History  Diagnosis Date  . Cancer     lung ca dx'd 04/2013   History reviewed. No pertinent past surgical history. No family history on file. History  Substance Use Topics  . Smoking status: Current Every Day Smoker -- 2.00 packs/day for 15 years     Types: Cigarettes  . Smokeless tobacco: Not on file  . 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    Allergies  Review of patient's allergies indicates no known allergies.  Home Medications   Current Outpatient Rx  Name  Route  Sig  Dispense  Refill  . HYDROcodone-acetaminophen (NORCO/VICODIN) 5-325 MG per tablet   Oral   Take 1 tablet by mouth every 6 (six) hours as needed for pain. TAKE 1 TABLET BY MOUTH EVERY 6 HOURS AS NEEDED - Per Dr. Mitzi Hansen on 07/23/2013, change to take 1-2 tablets by mouth every 4-6 hours as needed.  Not to exceed 10 tablets in 24 hours.   40 tablet   0   . omeprazole (PRILOSEC) 40 MG capsule   Oral   Take 40 mg by mouth daily.         . promethazine (PHENERGAN) 25 MG tablet   Oral   Take 1 tablet (25 mg total) by mouth every 6 (six) hours as needed for nausea.   30 tablet   1    BP 110/61  Pulse 133  Temp(Src) 97.5 F (36.4 C) (Oral)  Resp 18  SpO2 97%  Vital signs normal except for tachycardia  Physical Exam  Nursing note and vitals reviewed. Constitutional:  He is oriented to person, place, and time. He appears well-developed and well-nourished.  Non-toxic appearance. He does not appear ill. No distress.  HENT:  Head: Normocephalic and atraumatic.  Right Ear: External ear normal.  Left Ear: External ear normal.  Nose: Nose normal. No mucosal edema or rhinorrhea.  Mouth/Throat: Oropharynx is clear and moist and mucous membranes are normal. No dental abscesses or edematous.  Eyes: Conjunctivae and EOM are normal. Pupils are equal, round, and reactive to light.  Neck: Normal range of motion and full passive range of motion without pain. Neck supple.  Cardiovascular: Normal rate, regular rhythm and normal heart sounds.  Exam reveals no gallop and no friction rub.   No murmur heard. Pulmonary/Chest: Effort normal and breath sounds normal. No  respiratory distress. He has no wheezes. He has no rhonchi. He has no rales. He exhibits no tenderness and no crepitus.  Abdominal: Soft. Normal appearance and bowel sounds are normal. He exhibits no distension. There is no tenderness. There is no rebound and no guarding.  Musculoskeletal: Normal range of motion. He exhibits no edema and no tenderness.  Moves all extremities well.   Neurological: He is alert and oriented to person, place, and time. He has normal strength. No cranial nerve deficit.  Skin: Skin is warm, dry and intact. No rash noted. No erythema. There is pallor.  Psychiatric: His speech is normal. His mood appears not anxious. His affect is angry and labile. He is agitated.    ED Course  Procedures (including critical care time)  I had discussed with the patient we would do basic lab work in the ED and when he was discharged he could go to the cancer center. Nurse reports patient did not want to stay in the ED and signed out AMA.   Labs Review  CBC, CMP,UA ordered, but not done   Results for orders placed in visit on 08/31/13  CBC WITH DIFFERENTIAL      Result Value Range   WBC 14.5 (*) 4.0 - 10.3 10e3/uL   NEUT# 11.0 (*) 1.5 - 6.5 10e3/uL   HGB 14.2  13.0 - 17.1 g/dL   HCT 96.0  45.4 - 09.8 %   Platelets 195  140 - 400 10e3/uL   MCV 92.5  79.3 - 98.0 fL   MCH 31.4  27.2 - 33.4 pg   MCHC 34.0  32.0 - 36.0 g/dL   RBC 1.19  1.47 - 8.29 10e6/uL   RDW 17.4 (*) 11.0 - 14.6 %   lymph# 1.8  0.9 - 3.3 10e3/uL   MONO# 1.5 (*) 0.1 - 0.9 10e3/uL   Eosinophils Absolute 0.1  0.0 - 0.5 10e3/uL   Basophils Absolute 0.1  0.0 - 0.1 10e3/uL   NEUT% 76.0 (*) 39.0 - 75.0 %   LYMPH% 12.1 (*) 14.0 - 49.0 %   MONO% 10.6  0.0 - 14.0 %   EOS% 0.4  0.0 - 7.0 %   BASO% 0.9  0.0 - 2.0 %   nRBC 0  0 - 0 %  COMPREHENSIVE METABOLIC PANEL (CC13)      Result Value Range   Sodium 138  136 - 145 mEq/L   Potassium 4.1  3.5 - 5.1 mEq/L   Chloride 102  98 - 109 mEq/L   CO2 28  22 - 29 mEq/L    Glucose 123  70 - 140 mg/dl   BUN 56.2  7.0 - 13.0 mg/dL   Creatinine 0.9  0.7 - 1.3 mg/dL  Total Bilirubin 0.44  0.20 - 1.20 mg/dL   Alkaline Phosphatase 113  40 - 150 U/L   AST 18  5 - 34 U/L   ALT 14  0 - 55 U/L   Total Protein 7.4  6.4 - 8.3 g/dL   Albumin 3.8  3.5 - 5.0 g/dL   Calcium 9.8  8.4 - 16.1 mg/dL     Imaging Review  Ct Chest W Contrast Ct Abdomen Pelvis W Contrast  08/28/2013  IMPRESSION: CT CHEST IMPRESSION  1. Continued decrease in size of left upper lobe mass which is now nodular size.  2. No mediastinal lymphadenopathy.  CT ABDOMEN AND PELVIS IMPRESSION  1. Decrease in size of nodule exophytic / adjacent to the right kidney per. Legrand Rams this to represent a metastatic implant to the retroperitoneum rather than a true renal lesion with a decrease in size corresponding to chemotherapy. Recommend attention on follow-up.  2. Stable sclerotic metastasis at T1.   Electronically Signed   By: Genevive Bi M.D.   On: 08/28/2013 14:13     MDM   1. Weakness     Pt left AMA   Devoria Albe, MD, Armando Gang    Ward Givens, MD 09/09/13 [phone]

## 2013-09-09 NOTE — ED Notes (Signed)
Bed: NW29 Expected date:  Expected time:  Means of arrival:  Comments: Weakness, CA

## 2013-09-09 NOTE — ED Notes (Signed)
Pt left AMA-stated he only needed ride to cancer center-did not want to stay and be treated in ED then go to cancer center

## 2013-09-09 NOTE — ED Notes (Signed)
Nursing secretary Clydie Braun approached me to inform me that patient would like his discharge papers-RN Stacy notified

## 2013-09-11 ENCOUNTER — Inpatient Hospital Stay (HOSPITAL_COMMUNITY)
Admission: EM | Admit: 2013-09-11 | Discharge: 2013-09-13 | DRG: 809 | Disposition: A | Discharge status: Home / Self Care | Payer: Medicaid Other | Attending: Internal Medicine | Admitting: Internal Medicine

## 2013-09-11 ENCOUNTER — Encounter: Payer: Self-pay | Admitting: *Deleted

## 2013-09-11 ENCOUNTER — Encounter (HOSPITAL_COMMUNITY): Payer: Self-pay | Admitting: *Deleted

## 2013-09-11 ENCOUNTER — Telehealth: Payer: Self-pay | Admitting: *Deleted

## 2013-09-11 ENCOUNTER — Inpatient Hospital Stay (HOSPITAL_COMMUNITY): Payer: Medicaid Other

## 2013-09-11 ENCOUNTER — Emergency Department (HOSPITAL_COMMUNITY): Payer: Medicaid Other

## 2013-09-11 DIAGNOSIS — R443 Hallucinations, unspecified: Secondary | ICD-10-CM | POA: Diagnosis present

## 2013-09-11 DIAGNOSIS — T451X5A Adverse effect of antineoplastic and immunosuppressive drugs, initial encounter: Principal | ICD-10-CM | POA: Diagnosis present

## 2013-09-11 DIAGNOSIS — C3410 Malignant neoplasm of upper lobe, unspecified bronchus or lung: Secondary | ICD-10-CM | POA: Diagnosis present

## 2013-09-11 DIAGNOSIS — R410 Disorientation, unspecified: Secondary | ICD-10-CM | POA: Diagnosis present

## 2013-09-11 DIAGNOSIS — D899 Disorder involving the immune mechanism, unspecified: Secondary | ICD-10-CM | POA: Diagnosis present

## 2013-09-11 DIAGNOSIS — R4182 Altered mental status, unspecified: Secondary | ICD-10-CM | POA: Diagnosis present

## 2013-09-11 DIAGNOSIS — E876 Hypokalemia: Secondary | ICD-10-CM | POA: Diagnosis present

## 2013-09-11 DIAGNOSIS — C7931 Secondary malignant neoplasm of brain: Secondary | ICD-10-CM | POA: Diagnosis present

## 2013-09-11 DIAGNOSIS — D61818 Other pancytopenia: Secondary | ICD-10-CM

## 2013-09-11 DIAGNOSIS — R404 Transient alteration of awareness: Secondary | ICD-10-CM | POA: Diagnosis present

## 2013-09-11 DIAGNOSIS — C3490 Malignant neoplasm of unspecified part of unspecified bronchus or lung: Secondary | ICD-10-CM

## 2013-09-11 DIAGNOSIS — F17200 Nicotine dependence, unspecified, uncomplicated: Secondary | ICD-10-CM | POA: Diagnosis present

## 2013-09-11 DIAGNOSIS — R451 Restlessness and agitation: Secondary | ICD-10-CM | POA: Diagnosis not present

## 2013-09-11 DIAGNOSIS — D61810 Antineoplastic chemotherapy induced pancytopenia: Principal | ICD-10-CM | POA: Diagnosis present

## 2013-09-11 LAB — URINALYSIS, ROUTINE W REFLEX MICROSCOPIC
Glucose, UA: NEGATIVE mg/dL
Ketones, ur: NEGATIVE mg/dL
Leukocytes, UA: NEGATIVE
Protein, ur: NEGATIVE mg/dL
Urobilinogen, UA: 1 mg/dL (ref 0.0–1.0)
pH: 5 (ref 5.0–8.0)

## 2013-09-11 LAB — COMPREHENSIVE METABOLIC PANEL
AST: 14 U/L (ref 0–37)
Albumin: 3.7 g/dL (ref 3.5–5.2)
Chloride: 102 mEq/L (ref 96–112)
Creatinine, Ser: 0.92 mg/dL (ref 0.50–1.35)
Potassium: 3.4 mEq/L — ABNORMAL LOW (ref 3.5–5.1)
Total Bilirubin: 0.6 mg/dL (ref 0.3–1.2)
Total Protein: 6.3 g/dL (ref 6.0–8.3)

## 2013-09-11 LAB — CBC WITH DIFFERENTIAL/PLATELET
Band Neutrophils: 36 % — ABNORMAL HIGH (ref 0–10)
Basophils Absolute: 0.2 10*3/uL — ABNORMAL HIGH (ref 0.0–0.1)
Basophils Relative: 6 % — ABNORMAL HIGH (ref 0–1)
Eosinophils Absolute: 0 10*3/uL (ref 0.0–0.7)
Eosinophils Relative: 1 % (ref 0–5)
HCT: 25.4 % — ABNORMAL LOW (ref 39.0–52.0)
MCH: 32 pg (ref 26.0–34.0)
MCHC: 34.6 g/dL (ref 30.0–36.0)
MCV: 92.4 fL (ref 78.0–100.0)
Metamyelocytes Relative: 0 %
Monocytes Absolute: 0.1 10*3/uL (ref 0.1–1.0)
Monocytes Relative: 2 % — ABNORMAL LOW (ref 3–12)
Myelocytes: 0 %
Platelets: 32 10*3/uL — ABNORMAL LOW (ref 150–400)
Promyelocytes Absolute: 0 %
RBC: 2.75 MIL/uL — ABNORMAL LOW (ref 4.22–5.81)
nRBC: 0 /100 WBC

## 2013-09-11 LAB — RAPID URINE DRUG SCREEN, HOSP PERFORMED
Amphetamines: NOT DETECTED
Barbiturates: NOT DETECTED
Opiates: NOT DETECTED
Tetrahydrocannabinol: NOT DETECTED

## 2013-09-11 LAB — AMMONIA: Ammonia: 13 umol/L (ref 11–60)

## 2013-09-11 MED ORDER — ONDANSETRON HCL 4 MG/2ML IJ SOLN: 4.0000 mg | Freq: Four times a day (QID) | INTRAMUSCULAR | Status: DC | PRN

## 2013-09-11 MED ORDER — SODIUM CHLORIDE 0.9 % IJ SOLN: 3.0000 mL | INTRAMUSCULAR | Status: DC | PRN

## 2013-09-11 MED ORDER — SODIUM CHLORIDE 0.9 % IV BOLUS (SEPSIS)
500.0000 mL | Freq: Once | INTRAVENOUS | Status: AC
  Administered 2013-09-11: 500 mL via INTRAVENOUS

## 2013-09-11 MED ORDER — PANTOPRAZOLE SODIUM 40 MG PO TBEC
80.0000 mg | DELAYED_RELEASE_TABLET | Freq: Every day | ORAL | Status: DC
  Administered 2013-09-11 – 2013-09-13 (×3): 80 mg via ORAL
  Filled 2013-09-11 (×3): qty 2

## 2013-09-11 MED ORDER — ONDANSETRON HCL 4 MG PO TABS: 4.0000 mg | ORAL_TABLET | Freq: Four times a day (QID) | ORAL | Status: DC | PRN

## 2013-09-11 MED ORDER — ACETAMINOPHEN 325 MG PO TABS
650.0000 mg | ORAL_TABLET | Freq: Four times a day (QID) | ORAL | Status: DC | PRN
  Administered 2013-09-13: 650 mg via ORAL
  Filled 2013-09-11: qty 2

## 2013-09-11 MED ORDER — LEVOFLOXACIN IN D5W 750 MG/150ML IV SOLN
750.0000 mg | INTRAVENOUS | Status: DC
  Administered 2013-09-11: 20:00:00 750 mg via INTRAVENOUS
  Filled 2013-09-11 (×2): qty 150

## 2013-09-11 MED ORDER — POTASSIUM CHLORIDE CRYS ER 20 MEQ PO TBCR
40.0000 meq | EXTENDED_RELEASE_TABLET | Freq: Once | ORAL | Status: AC
  Administered 2013-09-11: 40 meq via ORAL
  Filled 2013-09-11: qty 2

## 2013-09-11 MED ORDER — SODIUM CHLORIDE 0.9 % IV SOLN
INTRAVENOUS | Status: AC
  Administered 2013-09-12 (×2): via INTRAVENOUS

## 2013-09-11 MED ORDER — SODIUM CHLORIDE 0.9 % IJ SOLN
3.0000 mL | Freq: Two times a day (BID) | INTRAMUSCULAR | Status: DC
  Administered 2013-09-11 – 2013-09-13 (×3): 3 mL via INTRAVENOUS

## 2013-09-11 MED ORDER — ENOXAPARIN SODIUM 40 MG/0.4ML ~~LOC~~ SOLN
40.0000 mg | SUBCUTANEOUS | Status: DC
Start: 2013-09-11 — End: 2013-09-12
  Administered 2013-09-11: 40 mg via SUBCUTANEOUS
  Filled 2013-09-11 (×2): qty 0.4

## 2013-09-11 MED ORDER — GADOBENATE DIMEGLUMINE 529 MG/ML IV SOLN
13.0000 mL | Freq: Once | INTRAVENOUS | Status: AC | PRN
  Administered 2013-09-11: 18:00:00 13 mL via INTRAVENOUS

## 2013-09-11 MED ORDER — SODIUM CHLORIDE 0.9 % IV SOLN: 250.0000 mL | INTRAVENOUS | Status: DC | PRN

## 2013-09-11 MED ORDER — ALUM & MAG HYDROXIDE-SIMETH 200-200-20 MG/5ML PO SUSP: 30.0000 mL | Freq: Four times a day (QID) | ORAL | Status: DC | PRN

## 2013-09-11 MED ORDER — ACETAMINOPHEN 650 MG RE SUPP: 650.0000 mg | Freq: Four times a day (QID) | RECTAL | Status: DC | PRN

## 2013-09-11 MED ORDER — SODIUM CHLORIDE 0.9 % IV BOLUS (SEPSIS)
500.0000 mL | Freq: Once | INTRAVENOUS | Status: AC
  Administered 2013-09-11: 22:00:00 via INTRAVENOUS

## 2013-09-11 NOTE — Progress Notes (Signed)
On call made aware about the low BP with new order received: NS 500 ml bolus. Will recheck BP in one hour.  09/11/13 2058 09/11/13 2131  Vitals  BP ! 78/53 mmHg ! 90/45 mmHg  BP Location Left arm Right arm  BP Method Automatic Manual  Patient Position, if appropriate Lying Lying  Pulse Rate 81 --   Pulse Rate Source Dinamap --

## 2013-09-11 NOTE — ED Provider Notes (Signed)
CSN: [id number]     Arrival date & time 09/11/13  1240 History   First MD Initiated Contact with Patient 09/11/13 1311     Chief Complaint  Patient presents with  . Altered Mental Status   (Consider location/radiation/quality/duration/timing/severity/associated sxs/prior Treatment) HPI Pt states he has had chest pain since he was diagnosed with cancer in may. Asked pt if pain has gotten worse, he denies. States he thinks "something is wrong with my head". Pt unable to explain. Admits to being depressed. States Dr. Shirline Frees send him to ED Clinical Social Work received call from patient's daughter, patient's separated spouse, and medical oncology RN regarding patient's change in behaviors. Mr. Sattler resides with his daughter Wanda Plump), daughter's boyfriend, and two grandchildren. Mr. Zuckerman daughter shares with CSW by phone that she is concerned and fearful of her father at this time. She stated he's reported seeing things under his bed, has broken his window and ceiling, and is acting aggressive. Patient's spouse contacted RN and RN recommended for patient to be evaluated in ED. CSW supported this recommendation when discussing with patient's family and met with patient in ED.  Mr. Cloke shared he "feels fine". He stated he has had visual hallucinations of people in his home and of vines growing around his room and trapping him. He reports these hallucinations began around five days ago. He stated he has not been aggressive to his family members but has "shouted at the people" and thrown his flashlight. CSW provided brief support to patient and will follow up once medical tests have been completed.  Past Medical History  Diagnosis Date  . Cancer     lung ca dx'd 04/2013   History reviewed. No pertinent past surgical history. No family history on file. History  Substance Use Topics  . Smoking status: Current Every Day Smoker -- 2.00 packs/day for 15 years    Types: Cigarettes  . Smokeless  tobacco: Not on file  . Alcohol Use: No    Review of Systems  Unable to perform ROS: Mental status change    Allergies  Review of patient's allergies indicates no known allergies.  Home Medications   Current Outpatient Rx  Name  Route  Sig  Dispense  Refill  . HYDROcodone-acetaminophen (NORCO/VICODIN) 5-325 MG per tablet   Oral   Take 1-2 tablets by mouth every 4 (four) hours as needed for pain.         Marland Kitchen omeprazole (PRILOSEC) 40 MG capsule   Oral   Take 40 mg by mouth daily.         . promethazine (PHENERGAN) 25 MG tablet   Oral   Take 1 tablet (25 mg total) by mouth every 6 (six) hours as needed for nausea.   30 tablet   1    BP 92/46  Pulse 73  Temp(Src) 98.6 F (37 C) (Oral)  Resp 15  SpO2 100% Physical Exam  Nursing note and vitals reviewed. Constitutional: He appears well-developed and well-nourished. No distress.  HENT:  Head: Normocephalic and atraumatic.  Eyes: Pupils are equal, round, and reactive to light.  Neck: Normal range of motion.  Cardiovascular: Normal rate and intact distal pulses.   Pulmonary/Chest: No respiratory distress.  Abdominal: Normal appearance. He exhibits no distension.  Musculoskeletal: Normal range of motion.  Neurological: He is alert. No cranial nerve deficit.  Skin: Skin is warm and dry. No rash noted.  Psychiatric: He has a normal mood and affect. His behavior is normal. Thought content is delusional.  Cognition and memory are impaired. He expresses impulsivity.    ED Course  Procedures (including critical care time) Labs Review Labs Reviewed  COMPREHENSIVE METABOLIC PANEL - Abnormal; Notable for the following:    Potassium 3.4 (*)    GFR calc non Af Amer 89 (*)    All other components within normal limits  CBC WITH DIFFERENTIAL - Abnormal; Notable for the following:    WBC 2.6 (*)    RBC 2.75 (*)    Hemoglobin 8.8 (*)    HCT 25.4 (*)    RDW 15.6 (*)    Platelets 32 (*)    Neutrophils Relative % 16 (*)     Monocytes Relative 2 (*)    Basophils Relative 6 (*)    Band Neutrophils 36 (*)    Neutro Abs 1.3 (*)    Basophils Absolute 0.2 (*)    All other components within normal limits  AMMONIA  URINE RAPID DRUG SCREEN (HOSP PERFORMED)  POCT I-STAT TROPONIN I   Imaging Review Dg Chest 2 View  09/11/2013   CLINICAL DATA:  Shortness of breath and chest pain. Small cell lung cancer.  EXAM: CHEST  2 VIEW  COMPARISON:  CT of the chest 08/28/2013.  FINDINGS: The heart size is normal. Chronic interstitial coarsening is stable. Superior mediastinal opacification is improved without complete resolution. Emphysematous changes are again noted. Biapical pleural parenchymal scarring is noted. The visualized soft tissues and bony thorax are unremarkable.  IMPRESSION: 1. Improved appearance of superior mediastinal opacification without complete resolution. 2. Chronic interstitial coarsening. 3. Stable biapical pleural parenchymal scarring. 4. No acute cardiopulmonary disease.   Electronically Signed   By: Gennette Pac   On: 09/11/2013 14:19   Ct Head Wo Contrast  09/11/2013   CLINICAL DATA:  Headache. Metastatic lung cancer  EXAM: CT HEAD WITHOUT CONTRAST  TECHNIQUE: Contiguous axial images were obtained from the base of the skull through the vertex without intravenous contrast.  COMPARISON:  MRI 05/21/2013  FINDINGS: The MRI revealed an enhancing metastatic deposit in the right frontal parietal periventricular white matter. This is not identified on the current study. Right cerebellar enhancing lesion also not visualized.  Negative for hemorrhage or mass. No edema. No acute infarct. No skull lesion.  IMPRESSION: . No acute abnormality.   Electronically Signed   By: Marlan Palau M.D.   On: 09/11/2013 14:10    MDM   1. Pancytopenia   2. Altered mental status    Discussed case with Dr. Arbutus Ped of oncology.  Patient be admitted for evaluation of pancytopenia    Nelia Shi, MD 09/12/13 1135

## 2013-09-11 NOTE — Progress Notes (Signed)
Rechecked BP after 500 ml bolus of NS, still low at 90/40. On call NP made aware and new order received to give another bolus of 500 ml NS then maintaining fluid of NS at 125 ML/hr .

## 2013-09-11 NOTE — Progress Notes (Signed)
   CARE MANAGEMENT ED NOTE 09/11/2013  Patient:  Jacob Webb, Jacob Webb   Account Number:  [id number]  Date Initiated:  09/11/2013  Documentation initiated by:  Edd Arbour  Subjective/Objective Assessment:     Subjective/Objective Assessment Detail:   62 y.o. male( medicaid of [employer]) with a PMH of lung cancer under the care of Dr. Arbutus Ped for palliative chemotherapy who presents with reports of violent behavior and changes in his mental status with fatigue and activity intolerance that has gotten worse over the past week.  He tells me his last chemo was over a week ago, and that he tolerated it well  in [facility] ED BP 81/40-96/54 UDS -negative hgb 8.8 wbc 2.6  Admitting suspect occult infection in the setting of being immunocompromised with either urinary or lung source  started on levaquin iv     Action/Plan:   Action/Plan Detail:   UR completed scanned medicaid card from august 2014 does not indicate a pcp and the pt is unable at this time to indicate a pcp but confirms seen by Dr Arbutus Ped   Anticipated DC Date:  09/14/2013     Status Recommendation to Physician:   Result of Recommendation:    Other ED Services  Consult Working Plan    DC Planning Services  Other  PCP issues  Outpatient Services - Pt will follow up    Choice offered to / List presented to:            Status of service:  Completed, signed off  ED Comments:   ED Comments Detail:

## 2013-09-11 NOTE — ED Notes (Signed)
Pt states he has had chest pain since he was diagnosed with cancer in may. Asked pt if pain has gotten worse, he denies. States he thinks "something is wrong with my head". Pt unable to explain. Admits to being depressed. States Dr. Shirline Frees send him to ED

## 2013-09-11 NOTE — Progress Notes (Signed)
[facility] Clinical Social Work  Clinical Social Work received call from patient's daughter, patient's separated spouse, and medical oncology RN regarding patient's change in behaviors.  Mr. Kupper resides with his daughter Wanda Plump), daughter's boyfriend, and two grandchildren.  Mr. Chouinard daughter shares with CSW by phone that she is concerned and fearful of her father at this time.  She stated he's reported seeing things under his bed, has broken his window and ceiling, and is acting aggressive.  Patient's spouse contacted RN and RN recommended for patient to be evaluated in ED.  CSW supported this recommendation when discussing with patient's family and met with patient in ED.  Mr. Ilic shared he "feels fine". He stated he has had visual hallucinations of people in his home and of vines growing around his room and trapping him.  He reports these hallucinations began around five days ago. He stated he has not been aggressive to his family members but has "shouted at the people" and thrown his flashlight.  CSW provided brief support to patient and will follow up once medical tests have been completed.   Kathrin Penner, MSW, LCSW Clinical Social Worker [facility] [phone]

## 2013-09-11 NOTE — Progress Notes (Signed)
Patient back from MRI. Patient is alert and oriented and answers questions appropriately. No agitation noted. Will continue to monitor patient. Setzer, Don Broach

## 2013-09-11 NOTE — ED Notes (Signed)
Pt has been having visual hallucinations, was sent to ED for evaluation, and to see if chemo medications causing pt to have hallucinations

## 2013-09-11 NOTE — ED Notes (Signed)
Pt given cheese and crackers per request 

## 2013-09-11 NOTE — ED Notes (Signed)
Patient transported to CT/DG

## 2013-09-11 NOTE — Progress Notes (Signed)
Attempted to get report from ED RN. No one able to take report at that time. Setzer, Don Broach

## 2013-09-11 NOTE — H&P (Signed)
[facility] History and Physical  [name] WJX:[id number] DOB: July 12, 1951 DOA: 09/11/2013  Referring physician: Dr. Nelva Nay. PCP: No PCP Per Patient  Oncologist: Dr. Arbutus Ped  Chief Complaint: Delirium   History of Present Illness: Jacob Webb is an 62 y.o. male with a PMH of lung cancer under the care of Dr. Arbutus Ped for palliative chemotherapy who presents with reports of violent behavior and changes in his mental status with fatigue and activity intolerance that has gotten worse over the past week.  He tells me his last chemo was over a week ago, and that he tolerated it well.  Mr. Willers describes a dream-like state in which he is seeing things that he knows are not there. He is not a good historian and he does not describe any behavior that is violent however a few of his records indicates that one of his friends called the cancer Center with concerns that he was throwing things, exhibiting violent behavior and talking/laughing to himself. He was seen by Dr. Arbutus Ped on 08/31/2013 and appeared to be doing well during that visit. He subsequently presented to the emergency room on 09/09/2013 for unclear reasons. He seemed a bit disoriented and only had generalized complaints and ultimately left AMA. I am unable to reach the patient's daughter by telephone to verify history.  Review of Systems: Constitutional: No fever, no chills;  Appetite variable; + weight loss, no weight gain, + fatigue.  HEENT: No blurry vision, no diplopia, no pharyngitis, no dysphagia CV: + exertional chest tightness, no palpitations.  Resp: + SOB, +cough. GI: + nausea, + vomiting x 1 one week ago, + mild diarrhea, no melena, no hematochezia.  GU: No dysuria, no hematuria. MSK: + generalized myalgias, no arthralgias.  Neuro:  No headache, no focal neurological deficits, no history of seizures, + left hand paresthesia.  Psych: No depression, no anxiety.  Endo: No heat intolerance, no cold intolerance, no  polyuria, no polydipsia  Skin: No rashes, no skin lesions.  Heme: No easy bruising.  Past Medical History Past Medical History  Diagnosis Date  . Cancer     lung ca dx'd 04/2013     Past Surgical History History reviewed. No pertinent past surgical history.   Social History: History   Social History  . Marital Status: Legally Separated    Spouse Name: N/A    Number of Children: 3  . Years of Education: 12   Occupational History  . Truck driver, retired    Social History Main Topics  . Smoking status: Former Smoker -- 2.00 packs/day for 15 years    Types: Cigarettes    Quit date: 09/04/2013  . Smokeless tobacco: Never Used  . Alcohol Use: No  . Drug Use: No  . Sexual Activity: No   Other Topics Concern  . Not on file   Social History Narrative   Lives with oldest daughter.  Ambulates with a cane.      Family History:  Family History  Problem Relation Age of Onset  . Aneurysm Mother   . COPD Father   . CAD Brother     Allergies: Review of patient's allergies indicates no known allergies.  Meds: Prior to Admission medications   Medication Sig Start Date End Date Taking? Authorizing Provider  HYDROcodone-acetaminophen (NORCO/VICODIN) 5-325 MG per tablet Take 1-2 tablets by mouth every 4 (four) hours as needed for pain.   Yes Historical Provider, MD  omeprazole (PRILOSEC) 40 MG capsule Take 40 mg by mouth daily.  Yes Historical Provider, MD  promethazine (PHENERGAN) 25 MG tablet Take 1 tablet (25 mg total) by mouth every 6 (six) hours as needed for nausea. 08/31/13  Yes Si Gaul, MD    Physical Exam: Filed Vitals:   09/11/13 1252 09/11/13 1518 09/11/13 1521  BP: 96/54 81/40 92/46   Pulse: 74 69 73  Temp: 98.6 F (37 C)    TempSrc: Oral    Resp: 21 12 15   SpO2: 97% 97% 100%     Physical Exam: Blood pressure 92/46, pulse 73, temperature 98.6 F (37 C), temperature source Oral, resp. rate 15, SpO2 100.00%. Gen: No acute distress. Head:  Normocephalic, atraumatic. Eyes: PERRL, EOMI, sclerae nonicteric. Mouth: Oropharynx clear with moist mucous membranes. No mucositis or thrush. Poor dentition with multiple missing teeth. Neck: Supple, no thyromegaly, no lymphadenopathy, no jugular venous distention. Chest: Lungs clear to auscultation bilaterally. CV: Heart sounds are regular. No murmurs, rubs, or gallops. Abdomen: Soft, nontender, nondistended with normal active bowel sounds. Extremities: Extremities are without edema. Skin: Warm and dry. Neuro: Alert and grossly oriented, cranial nerves II through XII grossly intact. Psych: Mood and affect anxious with poor concentration but no overtly psychotic behavior.  Labs on Admission:  Basic Metabolic Panel:  Recent Labs Lab 09/11/13 1335  NA 138  K 3.4*  CL 102  CO2 27  GLUCOSE 84  BUN 20  CREATININE 0.92  CALCIUM 9.6   Liver Function Tests:  Recent Labs Lab 09/11/13 1335  AST 14  ALT 16  ALKPHOS 109  BILITOT 0.6  PROT 6.3  ALBUMIN 3.7    Recent Labs Lab 09/11/13 1335  AMMONIA 13   CBC:  Recent Labs Lab 09/11/13 1335  WBC 2.6*  NEUTROABS 1.3*  HGB 8.8*  HCT 25.4*  MCV 92.4  PLT 32*    Radiological Exams on Admission: Dg Chest 2 View  09/11/2013   CLINICAL DATA:  Shortness of breath and chest pain. Small cell lung cancer.  EXAM: CHEST  2 VIEW  COMPARISON:  CT of the chest 08/28/2013.  FINDINGS: The heart size is normal. Chronic interstitial coarsening is stable. Superior mediastinal opacification is improved without complete resolution. Emphysematous changes are again noted. Biapical pleural parenchymal scarring is noted. The visualized soft tissues and bony thorax are unremarkable.  IMPRESSION: 1. Improved appearance of superior mediastinal opacification without complete resolution. 2. Chronic interstitial coarsening. 3. Stable biapical pleural parenchymal scarring. 4. No acute cardiopulmonary disease.   Electronically Signed   By: Gennette Pac    On: 09/11/2013 14:19   Ct Head Wo Contrast  09/11/2013   CLINICAL DATA:  Headache. Metastatic lung cancer  EXAM: CT HEAD WITHOUT CONTRAST  TECHNIQUE: Contiguous axial images were obtained from the base of the skull through the vertex without intravenous contrast.  COMPARISON:  MRI 05/21/2013  FINDINGS: The MRI revealed an enhancing metastatic deposit in the right frontal parietal periventricular white matter. This is not identified on the current study. Right cerebellar enhancing lesion also not visualized.  Negative for hemorrhage or mass. No edema. No acute infarct. No skull lesion.  IMPRESSION: . No acute abnormality.   Electronically Signed   By: Marlan Palau M.D.   On: 09/11/2013 14:10    EKG: None done.  Will order.  Assessment/Plan Principal Problem:   Delirium with probable occult infection the setting of being immunocompromised -Suspect occult infection in the setting of being immunocompromised with either urinary or lung source. -Start empiric Levaquin. -MRI of the brain to rule out metastasis. Active  Problems:   Cancer of upper lobe of lung -Will notify Dr. Arbutus Ped of the patient's admission to the hospital.   Agitation -Will order one-to-one safety sitter.   Hypokalemia -Replete orally.   Pancytopenia -Likely related to his recent chemotherapy. Monitor blood counts.  Code Status: Full. Family Communication: Clearence Ped. (brother) [phone] Disposition Plan: Home when stable.  Time spent: 70 minutes.  Arieona Swaggerty [facility] Pager [phone]  If 7PM-7AM, please contact night-coverage [email or web address] Password [facility] 09/11/2013, 5:24 PM

## 2013-09-11 NOTE — Telephone Encounter (Signed)
Received call from pt's friend Darl Pikes that patient has been throwing things, exhibiting violent behaviors, has been talking to himself and laughing to himself.  Darl Pikes stated that yesterday he ended up in a ditch and the police took him back to [employer].  He is now throwing glass and other items.  She and his estranged wife who is also at the house do not feel safe around him.  They do not want him at the house anymore and do not want him to come back.  They do not know what to do.  Informed them they need to call 911 to get an ambulance to take him to the ED.  He needs to be evaluated to r/o brain mets, psych issues, etc.  Asked to speak to patient.  While speaking with patient, he verbalized understanding regarding needing to go to the ED He states he does feel that "something needs to be done".  Called and spoke to the charge RN at the ED regarding case.  Also spoke with Kathrin Penner, social worker and she will see pt when he comes to the ED.  SLJ

## 2013-09-12 DIAGNOSIS — C7931 Secondary malignant neoplasm of brain: Secondary | ICD-10-CM

## 2013-09-12 DIAGNOSIS — IMO0002 Reserved for concepts with insufficient information to code with codable children: Secondary | ICD-10-CM

## 2013-09-12 DIAGNOSIS — C3490 Malignant neoplasm of unspecified part of unspecified bronchus or lung: Secondary | ICD-10-CM

## 2013-09-12 DIAGNOSIS — D61810 Antineoplastic chemotherapy induced pancytopenia: Principal | ICD-10-CM

## 2013-09-12 DIAGNOSIS — R443 Hallucinations, unspecified: Secondary | ICD-10-CM

## 2013-09-12 DIAGNOSIS — R404 Transient alteration of awareness: Secondary | ICD-10-CM

## 2013-09-12 DIAGNOSIS — D61818 Other pancytopenia: Secondary | ICD-10-CM

## 2013-09-12 LAB — CBC WITH DIFFERENTIAL/PLATELET
Basophils Absolute: 0 10*3/uL (ref 0.0–0.1)
Basophils Relative: 1 % (ref 0–1)
HCT: 21.8 % — ABNORMAL LOW (ref 39.0–52.0)
Lymphocytes Relative: 22 % (ref 12–46)
Lymphs Abs: 0.7 10*3/uL (ref 0.7–4.0)
MCHC: 34.9 g/dL (ref 30.0–36.0)
MCV: 92 fL (ref 78.0–100.0)
Monocytes Absolute: 0.5 10*3/uL (ref 0.1–1.0)
Neutro Abs: 1.8 10*3/uL (ref 1.7–7.7)
Neutrophils Relative %: 60 % (ref 43–77)
Platelets: 19 10*3/uL — CL (ref 150–400)
RBC: 2.37 MIL/uL — ABNORMAL LOW (ref 4.22–5.81)
RDW: 15.5 % (ref 11.5–15.5)

## 2013-09-12 LAB — ABO/RH: ABO/RH(D): O POS

## 2013-09-12 LAB — BASIC METABOLIC PANEL
BUN: 16 mg/dL (ref 6–23)
CO2: 23 mEq/L (ref 19–32)
Calcium: 8.9 mg/dL (ref 8.4–10.5)
Creatinine, Ser: 0.87 mg/dL (ref 0.50–1.35)
GFR calc Af Amer: >90 mL/min (ref >90)
GFR calc non Af Amer: >90 mL/min (ref >90)
Sodium: 136 mEq/L (ref 135–145)

## 2013-09-12 LAB — PREPARE RBC (CROSSMATCH)

## 2013-09-12 LAB — TSH: TSH: 0.426 u[IU]/mL (ref 0.350–4.500)

## 2013-09-12 MED ORDER — SODIUM CHLORIDE 0.9 % IJ SOLN: 3.0000 mL | INTRAMUSCULAR | Status: DC | PRN

## 2013-09-12 MED ORDER — SODIUM CHLORIDE 0.9 % IJ SOLN: 10.0000 mL | INTRAMUSCULAR | Status: DC | PRN

## 2013-09-12 MED ORDER — SODIUM CHLORIDE 0.9 % IV SOLN: 250.0000 mL | Freq: Once | INTRAVENOUS | Status: DC

## 2013-09-12 MED ORDER — HEPARIN SOD (PORK) LOCK FLUSH 100 UNIT/ML IV SOLN
500.0000 [IU] | Freq: Every day | INTRAVENOUS | Status: DC | PRN
  Filled 2013-09-12: qty 5

## 2013-09-12 MED ORDER — HEPARIN SOD (PORK) LOCK FLUSH 100 UNIT/ML IV SOLN
250.0000 [IU] | INTRAVENOUS | Status: DC | PRN
  Filled 2013-09-12: qty 3

## 2013-09-12 MED ORDER — ZOLPIDEM TARTRATE 5 MG PO TABS: 5.0000 mg | ORAL_TABLET | Freq: Every evening | ORAL | Status: DC | PRN

## 2013-09-12 NOTE — Progress Notes (Signed)
CRITICAL VALUE ALERT  Critical value received:  Platelet-19  Date of notification:  09/12/2013  Time of notification:  0657  Critical value read back:yes  Nurse who received alert:  Joya Gaskins  MD notified (1st page):  Tom callahan  Time of first page:  [phone]  MD notified (2nd page):  Time of second page:  Responding MD:  Lenny Pastel  Time MD responded:  [phone]

## 2013-09-12 NOTE — Progress Notes (Signed)
BP taken after 2nd bolus of NS 500 ml. Pt verbalized feeling better. Started maintenance IVF of NS at 125 ml/hr.Will cont to monitor.  09/12/13 0015  Vitals  BP ! 90/50 mmHg  BP Location Right arm  BP Method Manual  Patient Position, if appropriate Lying

## 2013-09-12 NOTE — Progress Notes (Addendum)
Assumed care of pt. No change in initial am assessment 

## 2013-09-12 NOTE — Progress Notes (Signed)
Jacob Webb   DOB:[birth year]   ZO#:[id number]   JWJ#:[id number]  Subjective: patient is "pretty much staying in bed;" he had some hallucinations PTA--a man and a woman sitting in his room, paying no attention to him, looking very normal "except they weren't really there"--the hallucinations might start or stop at any time--he jokes that he "finally saw some ghosts." They are not associated with a sense of fear, foreboding, elation, or any other feeling. It's more like a (pretty boring) movie being shown independently of his will. Currently "it isn't happening."-- Denies headaches, N/V, changes in vision, fever or bleeding. No family in room   Objective: middle aged white man examined in bed Filed Vitals:   09/12/13 1349  BP: 98/50  Pulse: 73  Temp: 98.6 F (37 C)  Resp: 18    Body mass index is 19.44 kg/(m^2).  Intake/Output Summary (Last 24 hours) at 09/12/13 1556 Last data filed at 09/12/13 1500  Gross per 24 hour  Intake 2241.67 ml  Output   1100 ml  Net 1141.67 ml     Sclerae unicteric  Lungs no rales or rhonchi--examined anterolaterally  Heart regular rate and rhythm  Abdomen benign  MSK no focal spinal tenderness, no peripheral edema  Neuro nonfocal, A & O x3, normal affect    CBG (last 3)  No results found for this basename: GLUCAP,  in the last 72 hours   Labs:  Lab Results  Component Value Date   WBC 2.9* 09/12/2013   HGB 7.6* 09/12/2013   HCT 21.8* 09/12/2013   MCV 92.0 09/12/2013   PLT 19* 09/12/2013   NEUTROABS 1.8 09/12/2013    @LASTCHEMISTRY @  Urine Studies No results found for this basename: UACOL, UAPR, USPG, UPH, UTP, UGL, UKET, UBIL, UHGB, UNIT, UROB, ULEU, UEPI, UWBC, URBC, UBAC, CAST, CRYS, UCOM, BILUA,  in the last 72 hours  Basic Metabolic Panel:  Recent Labs Lab 09/11/13 1335 09/12/13 0533  NA 138 136  K 3.4* 3.7  CL 102 103  CO2 27 23  GLUCOSE 84 85  BUN 20 16  CREATININE 0.92 0.87  CALCIUM 9.6 8.9   GFR Estimated Creatinine  Clearance: 79.7 ml/min (by C-G formula based on Cr of 0.87). Liver Function Tests:  Recent Labs Lab 09/11/13 1335  AST 14  ALT 16  ALKPHOS 109  BILITOT 0.6  PROT 6.3  ALBUMIN 3.7   No results found for this basename: LIPASE, AMYLASE,  in the last 168 hours  Recent Labs Lab 09/11/13 1335  AMMONIA 13   Coagulation profile No results found for this basename: INR, PROTIME,  in the last 168 hours  CBC:  Recent Labs Lab 09/11/13 1335 09/12/13 0533  WBC 2.6* 2.9*  NEUTROABS 1.3* 1.8  HGB 8.8* 7.6*  HCT 25.4* 21.8*  MCV 92.4 92.0  PLT 32* 19*   Cardiac Enzymes: No results found for this basename: CKTOTAL, CKMB, CKMBINDEX, TROPONINI,  in the last 168 hours BNP: No components found with this basename: POCBNP,  CBG: No results found for this basename: GLUCAP,  in the last 168 hours D-Dimer No results found for this basename: DDIMER,  in the last 72 hours Hgb A1c No results found for this basename: HGBA1C,  in the last 72 hours Lipid Profile No results found for this basename: CHOL, HDL, LDLCALC, TRIG, CHOLHDL, LDLDIRECT,  in the last 72 hours Thyroid function studies  Recent Labs  09/11/13 1854  TSH 0.426   Anemia work up No results found for this basename: VITAMINB12,  FOLATE, FERRITIN, TIBC, IRON, RETICCTPCT,  in the last 72 hours Microbiology No results found for this or any previous visit (from the past 240 hour(s)).    Studies:  Dg Chest 2 View  09/11/2013   CLINICAL DATA:  Shortness of breath and chest pain. Small cell lung cancer.  EXAM: CHEST  2 VIEW  COMPARISON:  CT of the chest 08/28/2013.  FINDINGS: The heart size is normal. Chronic interstitial coarsening is stable. Superior mediastinal opacification is improved without complete resolution. Emphysematous changes are again noted. Biapical pleural parenchymal scarring is noted. The visualized soft tissues and bony thorax are unremarkable.  IMPRESSION: 1. Improved appearance of superior mediastinal  opacification without complete resolution. 2. Chronic interstitial coarsening. 3. Stable biapical pleural parenchymal scarring. 4. No acute cardiopulmonary disease.   Electronically Signed   By: Gennette Pac   On: 09/11/2013 14:19   Ct Head Wo Contrast  09/11/2013   CLINICAL DATA:  Headache. Metastatic lung cancer  EXAM: CT HEAD WITHOUT CONTRAST  TECHNIQUE: Contiguous axial images were obtained from the base of the skull through the vertex without intravenous contrast.  COMPARISON:  MRI 05/21/2013  FINDINGS: The MRI revealed an enhancing metastatic deposit in the right frontal parietal periventricular white matter. This is not identified on the current study. Right cerebellar enhancing lesion also not visualized.  Negative for hemorrhage or mass. No edema. No acute infarct. No skull lesion.  IMPRESSION: . No acute abnormality.   Electronically Signed   By: Marlan Palau M.D.   On: 09/11/2013 14:10   Mr Laqueta Jean ZO Contrast  09/11/2013   CLINICAL DATA:  62 year old male with metastatic small cell lung cancer. Altered mental status. Being treated with chemotherapy and radiation. Brain metastases. Restaging. Whole brain radiation completed on 07/10/2013.  EXAM: MRI HEAD WITHOUT AND WITH CONTRAST  TECHNIQUE: Multiplanar, multiecho pulse sequences of the brain and surrounding structures were obtained according to standard protocol without and with intravenous contrast  CONTRAST:  13mL MULTIHANCE GADOBENATE DIMEGLUMINE 529 MG/ML IV SOLN  COMPARISON:  05/21/2013.  FINDINGS: Interval resolved 8 mm metastasis at the right Corona radiata (series 10, image 37) with no residual enhancement.  Interval resolved posterior right cerebellar metastasis (series 10, image 13) with no residual enhancement.  No new enhancing lesion. No midline shift, mass effect, or evidence of intracranial mass lesion.  Normal bone marrow signal. No restricted diffusion to suggest acute infarction. No midline shift, ventriculomegaly, extra-axial  collection or acute intracranial hemorrhage. Cervicomedullary junction and pituitary are within normal limits. Grossly negative visualized cervical spine. Major intracranial vascular flow voids are stable.  Mildly increased patchy and vague cerebral white matter T2 and FLAIR hyperintensity (series 6, image 14).  Visualized orbit soft tissues are within normal limits. Mildly increased paranasal sinus mucosal thickening. Mildly increased bilateral inferior mastoid air cell fluid. Negative visualized nasopharynx. Negative scalp soft tissues.  IMPRESSION: 1. Previous 2 small brain metastases have resolved with no residual enhancement. No new metastatic disease identified.  2. Subtle increased cerebral white matter T2 and FLAIR hyperintensity, favor sequelae of whole brain radiation.   Electronically Signed   By: Augusto Gamble M.D.   On: 09/11/2013 18:49    AssessmentPlan:. 62 y.o. [name], [place] man with extensive stage small-cell lung cancer admitted with hallucinations, pancytopenia  (1) brain metastases s/p whole brain irradiation completed 07/10/2013, repeat brain MRI yesterday showing completeresolution  (2) s/p fifth of 5 planned cycles of carboplatin/ etoposide chemotherapy, with good response; currently day 14 cycle 5  (3)  pancytopenia secondary to #2; the drop in Hb from 8.8 to 7.6 is likely related to vigorous hydration patient is undergoing for hypotension; will transfuse PRBCs and reassess (discussed w Dr Darnelle Catalan)  (4) hallucinations (patient is aware the 3D images "are not really there," the images do not carry any emotional component or relate to the patient in any way, they come and go "on their own schedule); this is not a psychiatric but a neurologic condition likely related in some way to patient's prior brain mets and its treatment; they may persist or resolve; patient reassured he is not "crazy"  Will follow with you.   Lowella Dell, MD 09/12/2013  3:56 PM

## 2013-09-12 NOTE — Progress Notes (Signed)
[facility] PROGRESS NOTE  Jacob Webb WJX:[id number] DOB: 1951/10/06 DOA: 09/11/2013 PCP: No PCP Per Patient  Assessment/Plan: Delirium -Seems to have completely resolved. -Unsure of etiology. -With a normal UA/CXR and no source of infection, will DC antibiotics. -MRI brain without sign of metastatic disease.  Lung Cancer -To follow up with onc as an outpatient.  Pancytopenia -Likely chemo induced. -Recheck CBC in am. Follow plt count closely.  Code Status: Full Code Family Communication: Patient inly  Disposition Plan: Home when stable; hopeful in 24-48 hours.   Consultants:  None   Antibiotics:  Levaquin 9/26-->9/27.   Subjective: No complaints. Asking when he can go home.  Objective: Filed Vitals:   09/11/13 2131 09/11/13 2301 09/12/13 0015 09/12/13 0503  BP: 90/45 90/40 90/50  90/45  Pulse:    80  Temp:    99.9 F (37.7 C)  TempSrc:    Oral  Resp:    16  Height:      Weight:      SpO2:    100%    Intake/Output Summary (Last 24 hours) at 09/12/13 1322 Last data filed at 09/12/13 1100  Gross per 24 hour  Intake 1006.67 ml  Output    900 ml  Net 106.67 ml   Filed Weights   09/11/13 1738  Weight: 63.2 kg (139 lb 5.3 oz)    Exam:   General:  AA Ox3, NAD  Cardiovascular: RRR  Respiratory: CTA B  Abdomen: S/NT/ND/+BS  Extremities: no C/C/E   Neurologic:  Non-focal.  Data Reviewed: Basic Metabolic Panel:  Recent Labs Lab 09/11/13 1335 09/12/13 0533  NA 138 136  K 3.4* 3.7  CL 102 103  CO2 27 23  GLUCOSE 84 85  BUN 20 16  CREATININE 0.92 0.87  CALCIUM 9.6 8.9   Liver Function Tests:  Recent Labs Lab 09/11/13 1335  AST 14  ALT 16  ALKPHOS 109  BILITOT 0.6  PROT 6.3  ALBUMIN 3.7   No results found for this basename: LIPASE, AMYLASE,  in the last 168 hours  Recent Labs Lab 09/11/13 1335  AMMONIA 13   CBC:  Recent Labs Lab 09/11/13 1335 09/12/13 0533  WBC 2.6* 2.9*  NEUTROABS 1.3* 1.8  HGB 8.8* 7.6*   HCT 25.4* 21.8*  MCV 92.4 92.0  PLT 32* 19*   Cardiac Enzymes: No results found for this basename: CKTOTAL, CKMB, CKMBINDEX, TROPONINI,  in the last 168 hours BNP (last 3 results) No results found for this basename: PROBNP,  in the last 8760 hours CBG: No results found for this basename: GLUCAP,  in the last 168 hours  No results found for this or any previous visit (from the past 240 hour(s)).   Studies: Dg Chest 2 View  09/11/2013   CLINICAL DATA:  Shortness of breath and chest pain. Small cell lung cancer.  EXAM: CHEST  2 VIEW  COMPARISON:  CT of the chest 08/28/2013.  FINDINGS: The heart size is normal. Chronic interstitial coarsening is stable. Superior mediastinal opacification is improved without complete resolution. Emphysematous changes are again noted. Biapical pleural parenchymal scarring is noted. The visualized soft tissues and bony thorax are unremarkable.  IMPRESSION: 1. Improved appearance of superior mediastinal opacification without complete resolution. 2. Chronic interstitial coarsening. 3. Stable biapical pleural parenchymal scarring. 4. No acute cardiopulmonary disease.   Electronically Signed   By: Gennette Pac   On: 09/11/2013 14:19   Ct Head Wo Contrast  09/11/2013   CLINICAL DATA:  Headache. Metastatic lung cancer  EXAM: CT HEAD WITHOUT CONTRAST  TECHNIQUE: Contiguous axial images were obtained from the base of the skull through the vertex without intravenous contrast.  COMPARISON:  MRI 05/21/2013  FINDINGS: The MRI revealed an enhancing metastatic deposit in the right frontal parietal periventricular white matter. This is not identified on the current study. Right cerebellar enhancing lesion also not visualized.  Negative for hemorrhage or mass. No edema. No acute infarct. No skull lesion.  IMPRESSION: . No acute abnormality.   Electronically Signed   By: Marlan Palau M.D.   On: 09/11/2013 14:10   Mr Laqueta Jean HK Contrast  09/11/2013   CLINICAL DATA:  62 year old  male with metastatic small cell lung cancer. Altered mental status. Being treated with chemotherapy and radiation. Brain metastases. Restaging. Whole brain radiation completed on 07/10/2013.  EXAM: MRI HEAD WITHOUT AND WITH CONTRAST  TECHNIQUE: Multiplanar, multiecho pulse sequences of the brain and surrounding structures were obtained according to standard protocol without and with intravenous contrast  CONTRAST:  13mL MULTIHANCE GADOBENATE DIMEGLUMINE 529 MG/ML IV SOLN  COMPARISON:  05/21/2013.  FINDINGS: Interval resolved 8 mm metastasis at the right Corona radiata (series 10, image 37) with no residual enhancement.  Interval resolved posterior right cerebellar metastasis (series 10, image 13) with no residual enhancement.  No new enhancing lesion. No midline shift, mass effect, or evidence of intracranial mass lesion.  Normal bone marrow signal. No restricted diffusion to suggest acute infarction. No midline shift, ventriculomegaly, extra-axial collection or acute intracranial hemorrhage. Cervicomedullary junction and pituitary are within normal limits. Grossly negative visualized cervical spine. Major intracranial vascular flow voids are stable.  Mildly increased patchy and vague cerebral white matter T2 and FLAIR hyperintensity (series 6, image 14).  Visualized orbit soft tissues are within normal limits. Mildly increased paranasal sinus mucosal thickening. Mildly increased bilateral inferior mastoid air cell fluid. Negative visualized nasopharynx. Negative scalp soft tissues.  IMPRESSION: 1. Previous 2 small brain metastases have resolved with no residual enhancement. No new metastatic disease identified.  2. Subtle increased cerebral white matter T2 and FLAIR hyperintensity, favor sequelae of whole brain radiation.   Electronically Signed   By: Augusto Gamble M.D.   On: 09/11/2013 18:49    Scheduled Meds: . enoxaparin (LOVENOX) injection  40 mg Subcutaneous Q24H  . levofloxacin (LEVAQUIN) IV  750 mg  Intravenous Q24H  . pantoprazole  80 mg Oral Daily  . sodium chloride  3 mL Intravenous Q12H   Continuous Infusions:   Principal Problem:   Delirium Active Problems:   Cancer of upper lobe of lung   Agitation   Hypokalemia   Pancytopenia    Time spent: 35 minutes    HERNANDEZ ACOSTA,ESTELA  [facility] Pager [phone]  If 7PM-7AM, please contact night-coverage at [email or web address], password [facility] 09/12/2013, 1:22 PM  LOS: 1 day

## 2013-09-13 DIAGNOSIS — C3410 Malignant neoplasm of upper lobe, unspecified bronchus or lung: Secondary | ICD-10-CM

## 2013-09-13 LAB — CBC WITH DIFFERENTIAL/PLATELET
Basophils Absolute: 0.1 10*3/uL (ref 0.0–0.1)
Eosinophils Relative: 1 % (ref 0–5)
Lymphocytes Relative: 11 % — ABNORMAL LOW (ref 12–46)
Lymphs Abs: 0.7 10*3/uL (ref 0.7–4.0)
MCH: 31.5 pg (ref 26.0–34.0)
MCV: 90.5 fL (ref 78.0–100.0)
Monocytes Relative: 9 % (ref 3–12)
Neutro Abs: 4.6 10*3/uL (ref 1.7–7.7)
Neutrophils Relative %: 78 % — ABNORMAL HIGH (ref 43–77)
Platelets: 15 10*3/uL — CL (ref 150–400)
RBC: 3.36 MIL/uL — ABNORMAL LOW (ref 4.22–5.81)
RDW: 14.8 % (ref 11.5–15.5)
WBC: 6 10*3/uL (ref 4.0–10.5)

## 2013-09-13 LAB — BASIC METABOLIC PANEL
BUN: 9 mg/dL (ref 6–23)
CO2: 26 mEq/L (ref 19–32)
Chloride: 104 mEq/L (ref 96–112)
Creatinine, Ser: 0.82 mg/dL (ref 0.50–1.35)
GFR calc Af Amer: >90 mL/min (ref >90)
Potassium: 3.7 mEq/L (ref 3.5–5.1)
Sodium: 138 mEq/L (ref 135–145)

## 2013-09-13 NOTE — Progress Notes (Signed)
Consulted with social work because patient stated that he didn't have a ride home.

## 2013-09-13 NOTE — Discharge Summary (Signed)
Physician Discharge Summary  Jacob Webb RUE:[id number] DOB: [birth year] DOA: 09/11/2013  PCP: No PCP Per Patient  Admit date: 09/11/2013 Discharge date: 09/13/2013  Time spent: 45 minutes  Recommendations for Outpatient Follow-up:  -Has been advised to follow up with his PCP in 2 weeks. -Follow up with Dr. Shirline Frees as previously scheduled.   Discharge Diagnoses:  Principal Problem:   Delirium Active Problems:   Cancer of upper lobe of lung   Agitation   Hypokalemia   Pancytopenia   Discharge Condition: Stable and improved  Filed Weights   09/11/13 1738  Weight: 63.2 kg (139 lb 5.3 oz)    History of present illness:  Jacob Webb is an 62 y.o. male with a PMH of lung cancer under the care of Dr. Arbutus Ped for palliative chemotherapy who presents with reports of violent behavior and changes in his mental status with fatigue and activity intolerance that has gotten worse over the past week. He tells me his last chemo was over a week ago, and that he tolerated it well. Mr. Verdi describes a dream-like state in which he is seeing things that he knows are not there. He is not a good historian and he does not describe any behavior that is violent however a few of his records indicates that one of his friends called the cancer Center with concerns that he was throwing things, exhibiting violent behavior and talking/laughing to himself. He was seen by Dr. Arbutus Ped on 08/31/2013 and appeared to be doing well during that visit. He subsequently presented to the emergency room on 09/09/2013 for unclear reasons. He seemed a bit disoriented and only had generalized complaints and ultimately left AMA. I am unable to reach the patient's daughter by telephone to verify history. We were asked to admit him for further evaluation and management.   Hospital Course:   Delirium  -Seems to have completely resolved.  -Unsure of etiology.  -With a normal UA/CXR and no source of infection, will DC  antibiotics.  -MRI brain without sign of metastatic disease.  -Clear sensorium and rational thinking.  Lung Cancer  -To follow up with onc as an outpatient.   Pancytopenia  -Likely chemo induced.  -Was transfused 2 unit of PRBCs for a Hb of 7.6. Hb has increased to 10.6 on DC.   Procedures:  None   Consultations:  Oncology  Discharge Instructions  Discharge Orders   Future Appointments Provider Department Dept Phone   11/30/2013 10:15 AM Krista Blue [facility] MEDICAL ONCOLOGY [phone]   11/30/2013 11:30 AM [facility] [phone]   Patient to arrive 15 minutes prior to appointment time.   12/02/2013 10:30 AM Si Gaul, MD [facility] MEDICAL ONCOLOGY [phone]   Future Orders Complete By Expires   Care order/instruction  09/12/2013 09/12/2022   Scheduling Instructions:     Premeds for PRBC transfusion: tylenol 650 mg po, benadryl 25 mg po   Comments:     Transfuse Parameters   Discontinue IV  As directed    Increase activity slowly  As directed        Medication List         HYDROcodone-acetaminophen 5-325 MG per tablet  Commonly known as:  NORCO/VICODIN  Take 1-2 tablets by mouth every 4 (four) hours as needed for pain.     omeprazole 40 MG capsule  Commonly known as:  PRILOSEC  Take 40 mg by mouth daily.     promethazine 25 MG  tablet  Commonly known as:  PHENERGAN  Take 1 tablet (25 mg total) by mouth every 6 (six) hours as needed for nausea.       No Known Allergies     Follow-up Information   Schedule an appointment as soon as possible for a visit today to follow up. (with your regular provider)        The results of significant diagnostics from this hospitalization (including imaging, microbiology, ancillary and laboratory) are listed below for reference.    Significant Diagnostic Studies: Dg Chest 2 View  09/11/2013   CLINICAL DATA:  Shortness of breath and  chest pain. Small cell lung cancer.  EXAM: CHEST  2 VIEW  COMPARISON:  CT of the chest 08/28/2013.  FINDINGS: The heart size is normal. Chronic interstitial coarsening is stable. Superior mediastinal opacification is improved without complete resolution. Emphysematous changes are again noted. Biapical pleural parenchymal scarring is noted. The visualized soft tissues and bony thorax are unremarkable.  IMPRESSION: 1. Improved appearance of superior mediastinal opacification without complete resolution. 2. Chronic interstitial coarsening. 3. Stable biapical pleural parenchymal scarring. 4. No acute cardiopulmonary disease.   Electronically Signed   By: Gennette Pac   On: 09/11/2013 14:19   Ct Head Wo Contrast  09/11/2013   CLINICAL DATA:  Headache. Metastatic lung cancer  EXAM: CT HEAD WITHOUT CONTRAST  TECHNIQUE: Contiguous axial images were obtained from the base of the skull through the vertex without intravenous contrast.  COMPARISON:  MRI 05/21/2013  FINDINGS: The MRI revealed an enhancing metastatic deposit in the right frontal parietal periventricular white matter. This is not identified on the current study. Right cerebellar enhancing lesion also not visualized.  Negative for hemorrhage or mass. No edema. No acute infarct. No skull lesion.  IMPRESSION: . No acute abnormality.   Electronically Signed   By: Marlan Palau M.D.   On: 09/11/2013 14:10   Ct Chest W Contrast  08/28/2013   CLINICAL DATA:  Malignant neoplasm of the lung. Small cell lung cancer  EXAM: CT CHEST, ABDOMEN, AND PELVIS WITH CONTRAST  TECHNIQUE: Multidetector CT imaging of the chest, abdomen and pelvis was performed following the standard protocol during bolus administration of intravenous contrast.  CONTRAST:  OMNIPAQUE IOHEXOL 300 MG/ML  SOLN  FINDINGS: CT CHEST FINDINGS  No axillary or supraclavicular lymphadenopathy. No mediastinal or hilar lymphadenopathy. No pericardial fluid. Esophagus is mildly thickened. No central  pulmonary embolism.  Review of the lung parenchyma demonstrates a continued decrease in size of the left upper lobe para mediastinal mass which now is nodular size measuring 12 mm x 11 mm compared to 19 by 17 mm on most recent prior (image 15, series 4). There is biapical pleural parenchymal nodular thickening which is not changed. Centrilobular emphysema is noted.  CT ABDOMEN AND PELVIS FINDINGS  No focal hepatic lesion. The gallbladder, pancreas, spleen, and adrenal glands are normal. The round lesion adjacent to the right kidney measures 11 mm (image 63, series 2) decreased in size from 12 mm on CT of 07/06/2013 and 14 mm on PET-CT scan 05/21/2013.  Stomach, small bowel, and colon are normal. Abdominal aorta is normal caliber. No retroperitoneal periportal lymphadenopathy.  Review of the bone windows demonstrate no aggressive osseous lesions. Sclerotic lesions within the T1 vertebral body on the right is unchanged. No new aggressive lesions.  IMPRESSION: CT CHEST IMPRESSION  1. Continued decrease in size of left upper lobe mass which is now nodular size.  2. No mediastinal lymphadenopathy.  CT  ABDOMEN AND PELVIS IMPRESSION  1. Decrease in size of nodule exophytic / adjacent to the right kidney per. Legrand Rams this to represent a metastatic implant to the retroperitoneum rather than a true renal lesion with a decrease in size corresponding to chemotherapy. Recommend attention on follow-up.  2. Stable sclerotic metastasis at T1.   Electronically Signed   By: Genevive Bi M.D.   On: 08/28/2013 14:13   Mr Laqueta Jean ZO Contrast  09/11/2013   CLINICAL DATA:  62 year old male with metastatic small cell lung cancer. Altered mental status. Being treated with chemotherapy and radiation. Brain metastases. Restaging. Whole brain radiation completed on 07/10/2013.  EXAM: MRI HEAD WITHOUT AND WITH CONTRAST  TECHNIQUE: Multiplanar, multiecho pulse sequences of the brain and surrounding structures were obtained according to  standard protocol without and with intravenous contrast  CONTRAST:  13mL MULTIHANCE GADOBENATE DIMEGLUMINE 529 MG/ML IV SOLN  COMPARISON:  05/21/2013.  FINDINGS: Interval resolved 8 mm metastasis at the right Corona radiata (series 10, image 37) with no residual enhancement.  Interval resolved posterior right cerebellar metastasis (series 10, image 13) with no residual enhancement.  No new enhancing lesion. No midline shift, mass effect, or evidence of intracranial mass lesion.  Normal bone marrow signal. No restricted diffusion to suggest acute infarction. No midline shift, ventriculomegaly, extra-axial collection or acute intracranial hemorrhage. Cervicomedullary junction and pituitary are within normal limits. Grossly negative visualized cervical spine. Major intracranial vascular flow voids are stable.  Mildly increased patchy and vague cerebral white matter T2 and FLAIR hyperintensity (series 6, image 14).  Visualized orbit soft tissues are within normal limits. Mildly increased paranasal sinus mucosal thickening. Mildly increased bilateral inferior mastoid air cell fluid. Negative visualized nasopharynx. Negative scalp soft tissues.  IMPRESSION: 1. Previous 2 small brain metastases have resolved with no residual enhancement. No new metastatic disease identified.  2. Subtle increased cerebral white matter T2 and FLAIR hyperintensity, favor sequelae of whole brain radiation.   Electronically Signed   By: Augusto Gamble M.D.   On: 09/11/2013 18:49   Ct Abdomen Pelvis W Contrast  08/28/2013   CLINICAL DATA:  Malignant neoplasm of the lung. Small cell lung cancer  EXAM: CT CHEST, ABDOMEN, AND PELVIS WITH CONTRAST  TECHNIQUE: Multidetector CT imaging of the chest, abdomen and pelvis was performed following the standard protocol during bolus administration of intravenous contrast.  CONTRAST:  OMNIPAQUE IOHEXOL 300 MG/ML  SOLN  FINDINGS: CT CHEST FINDINGS  No axillary or supraclavicular lymphadenopathy. No  mediastinal or hilar lymphadenopathy. No pericardial fluid. Esophagus is mildly thickened. No central pulmonary embolism.  Review of the lung parenchyma demonstrates a continued decrease in size of the left upper lobe para mediastinal mass which now is nodular size measuring 12 mm x 11 mm compared to 19 by 17 mm on most recent prior (image 15, series 4). There is biapical pleural parenchymal nodular thickening which is not changed. Centrilobular emphysema is noted.  CT ABDOMEN AND PELVIS FINDINGS  No focal hepatic lesion. The gallbladder, pancreas, spleen, and adrenal glands are normal. The round lesion adjacent to the right kidney measures 11 mm (image 63, series 2) decreased in size from 12 mm on CT of 07/06/2013 and 14 mm on PET-CT scan 05/21/2013.  Stomach, small bowel, and colon are normal. Abdominal aorta is normal caliber. No retroperitoneal periportal lymphadenopathy.  Review of the bone windows demonstrate no aggressive osseous lesions. Sclerotic lesions within the T1 vertebral body on the right is unchanged. No new aggressive lesions.  IMPRESSION:  CT CHEST IMPRESSION  1. Continued decrease in size of left upper lobe mass which is now nodular size.  2. No mediastinal lymphadenopathy.  CT ABDOMEN AND PELVIS IMPRESSION  1. Decrease in size of nodule exophytic / adjacent to the right kidney per. Legrand Rams this to represent a metastatic implant to the retroperitoneum rather than a true renal lesion with a decrease in size corresponding to chemotherapy. Recommend attention on follow-up.  2. Stable sclerotic metastasis at T1.   Electronically Signed   By: Genevive Bi M.D.   On: 08/28/2013 14:13    Microbiology: No results found for this or any previous visit (from the past 240 hour(s)).   Labs: Basic Metabolic Panel:  Recent Labs Lab 09/11/13 1335 09/12/13 0533 09/13/13 0815  NA 138 136 138  K 3.4* 3.7 3.7  CL 102 103 104  CO2 27 23 26   GLUCOSE 84 85 84  BUN 20 16 9   CREATININE 0.92 0.87 0.82   CALCIUM 9.6 8.9 8.8   Liver Function Tests:  Recent Labs Lab 09/11/13 1335  AST 14  ALT 16  ALKPHOS 109  BILITOT 0.6  PROT 6.3  ALBUMIN 3.7   No results found for this basename: LIPASE, AMYLASE,  in the last 168 hours  Recent Labs Lab 09/11/13 1335  AMMONIA 13   CBC:  Recent Labs Lab 09/11/13 1335 09/12/13 0533 09/13/13 0815  WBC 2.6* 2.9* 6.0  NEUTROABS 1.3* 1.8 4.6  HGB 8.8* 7.6* 10.6*  HCT 25.4* 21.8* 30.4*  MCV 92.4 92.0 90.5  PLT 32* 19* 15*   Cardiac Enzymes: No results found for this basename: CKTOTAL, CKMB, CKMBINDEX, TROPONINI,  in the last 168 hours BNP: BNP (last 3 results) No results found for this basename: PROBNP,  in the last 8760 hours CBG: No results found for this basename: GLUCAP,  in the last 168 hours     Signed:  Chaya Jan  [facility] Pager: [phone] 09/13/2013, 9:35 AM

## 2013-09-13 NOTE — Progress Notes (Signed)
Clinical Social Work Department BRIEF PSYCHOSOCIAL ASSESSMENT 09/13/2013  Patient:  Jacob Webb, Jacob Webb     Account Number:  [id number]     Admit date:  09/11/2013  Clinical Social Worker:  Jacob Webb  Date/Time:  09/13/2013 11:56 AM  Referred by:  Physician  Date Referred:  09/13/2013 Referred for  Other - See comment   Other Referral:   transportation, housing issues   Interview type:  Patient Other interview type:    PSYCHOSOCIAL DATA Living Status:   Admitted from facility:   Level of care:   Primary support name:  Jacob Webb Primary support relationship to patient:  CHILD, ADULT Degree of support available:   limited    CURRENT CONCERNS Current Concerns  Other - See comment   Other Concerns:   housing issues--potentially not able to return to previous arrangement; transportation    SOCIAL WORK ASSESSMENT / PLAN CSW consult requested re: transportation from hospital. CSW and training CSW met with pt. Pt reported that pt needed transit home. CSW and pt brainstormed potential options. Pt provided a friend, Jacob Webb, and her phone number.    CSW contacted Jacob Webb. Jacob Webb stated she could not provide him a ride nor let pt live with her. Per Jacob Webb, pt had been living with his older dauther, Jacob Webb, but Jacob Webb will not allow pt to return. Per Jacob Webb, pt displayed violent behaviors and Jacob Webb concerned about her children being in pt's presence.    CSW and pt discussed pt's siutation in light of information Jacob Webb provided. Pt reported that he does get "hallucinations" but that other people's concerns were "blown out of propotion." CSW and pt brainstormed next steps, and pt decided to call Jacob Webb and locate contact information of other natural resources. CSW suggested pt contact Jacob Webb, but pt declined, stating her prepaid phone was out of minutes. CSW stepped out to allow pt to facilitate.    CSW returned. Pt reported that Jacob Webb unable to help. Pt provided new potential resources: his  younger daughter and her boyfriend. While with pt, CSW tried to call both, and left messages for both. Presently awaiting call back.    CSW discussed resources she could physically provide: 1) a bus pass 2) list of homelessness resources in [employer]. CSW and pt agreed to discuss options later in the day, in order to allow time for pt's younger daughter and her boyfriend to call back.    Asssessment ongoing.   Assessment/plan status:  Psychosocial Support/Ongoing Assessment of Needs Other assessment/ plan:   Information/referral to community resources:    PATIENT'S/FAMILY'S RESPONSE TO PLAN OF CARE: Pt open discussing his concerns with CSW. Pt cooperative with CSW suggestions.      [address] [place], [phone]

## 2013-09-13 NOTE — Consult Note (Signed)
Jacob Webb   DOB:Jul 28, 1951   RU#:[id number]   BJY#:[id number]  Subjective: No acute overnight events noted.  Nurse Jacob Webb reports patient had a large bowel movement without blood. Patient reports no further hallucinations overnight.  He denies fevers or chills or acute shortness of breath.  He also denies nausea or vomiting.     Objective:  Filed Vitals:   09/13/13 0700  BP: 111/62  Pulse: 62  Temp: 97.9 F (36.6 C)  Resp: 16    Body mass index is 19.44 kg/(m^2).  Intake/Output Summary (Last 24 hours) at 09/13/13 1245 Last data filed at 09/13/13 0430  Gross per 24 hour  Intake   1750 ml  Output    975 ml  Net    775 ml    Chronically ill appearing male, alopecia  Sclerae unicteric  Oropharynx clear  Lungs clear -- no rales or rhonchi  Heart regular rate and rhythm  Abdomen benign  MSK no focal spinal tenderness, no peripheral edema  Neuro nonfocal; AAOx3.    Labs:  Lab Results  Component Value Date   WBC 6.0 09/13/2013   HGB 10.6* 09/13/2013   HCT 30.4* 09/13/2013   MCV 90.5 09/13/2013   PLT 15* 09/13/2013   NEUTROABS 4.6 09/13/2013   Basic Metabolic Panel:  Recent Labs Lab 09/11/13 1335 09/12/13 0533 09/13/13 0815  NA 138 136 138  K 3.4* 3.7 3.7  CL 102 103 104  CO2 27 23 26   GLUCOSE 84 85 84  BUN 20 16 9   CREATININE 0.92 0.87 0.82  CALCIUM 9.6 8.9 8.8   GFR Estimated Creatinine Clearance: 84.6 ml/min (by C-G formula based on Cr of 0.82). Liver Function Tests:  Recent Labs Lab 09/11/13 1335  AST 14  ALT 16  ALKPHOS 109  BILITOT 0.6  PROT 6.3  ALBUMIN 3.7    Recent Labs Lab 09/11/13 1335  AMMONIA 13   CBC:  Recent Labs Lab 09/11/13 1335 09/12/13 0533 09/13/13 0815  WBC 2.6* 2.9* 6.0  NEUTROABS 1.3* 1.8 4.6  HGB 8.8* 7.6* 10.6*  HCT 25.4* 21.8* 30.4*  MCV 92.4 92.0 90.5  PLT 32* 19* 15*   Thyroid function studies  Recent Labs  09/11/13 1854  TSH 0.426   Studies:  Dg Chest 2 View  09/11/2013   CLINICAL DATA:  Shortness of  breath and chest pain. Small cell lung cancer.  EXAM: CHEST  2 VIEW  COMPARISON:  CT of the chest 08/28/2013.  FINDINGS: The heart size is normal. Chronic interstitial coarsening is stable. Superior mediastinal opacification is improved without complete resolution. Emphysematous changes are again noted. Biapical pleural parenchymal scarring is noted. The visualized soft tissues and bony thorax are unremarkable.  IMPRESSION: 1. Improved appearance of superior mediastinal opacification without complete resolution. 2. Chronic interstitial coarsening. 3. Stable biapical pleural parenchymal scarring. 4. No acute cardiopulmonary disease.   Electronically Signed   By: Jacob Webb   On: 09/11/2013 14:19   Ct Head Wo Contrast  09/11/2013   CLINICAL DATA:  Headache. Metastatic lung cancer  EXAM: CT HEAD WITHOUT CONTRAST  TECHNIQUE: Contiguous axial images were obtained from the base of the skull through the vertex without intravenous contrast.  COMPARISON:  MRI 05/21/2013  FINDINGS: The MRI revealed an enhancing metastatic deposit in the right frontal parietal periventricular white matter. This is not identified on the current study. Right cerebellar enhancing lesion also not visualized.  Negative for hemorrhage or mass. No edema. No acute infarct. No skull lesion.  IMPRESSION: . No  acute abnormality.   Electronically Signed   By: Jacob Webb M.D.   On: 09/11/2013 14:10   Mr Jacob Webb ZO Contrast  09/11/2013   CLINICAL DATA:  62 year old male with metastatic small cell lung cancer. Altered mental status. Being treated with chemotherapy and radiation. Brain metastases. Restaging. Whole brain radiation completed on 07/10/2013.  EXAM: MRI HEAD WITHOUT AND WITH CONTRAST  TECHNIQUE: Multiplanar, multiecho pulse sequences of the brain and surrounding structures were obtained according to standard protocol without and with intravenous contrast  CONTRAST:  13mL MULTIHANCE GADOBENATE DIMEGLUMINE 529 MG/ML IV SOLN   COMPARISON:  05/21/2013.  FINDINGS: Interval resolved 8 mm metastasis at the right Corona radiata (series 10, image 37) with no residual enhancement.  Interval resolved posterior right cerebellar metastasis (series 10, image 13) with no residual enhancement.  No new enhancing lesion. No midline shift, mass effect, or evidence of intracranial mass lesion.  Normal bone marrow signal. No restricted diffusion to suggest acute infarction. No midline shift, ventriculomegaly, extra-axial collection or acute intracranial hemorrhage. Cervicomedullary junction and pituitary are within normal limits. Grossly negative visualized cervical spine. Major intracranial vascular flow voids are stable.  Mildly increased patchy and vague cerebral white matter T2 and FLAIR hyperintensity (series 6, image 14).  Visualized orbit soft tissues are within normal limits. Mildly increased paranasal sinus mucosal thickening. Mildly increased bilateral inferior mastoid air cell fluid. Negative visualized nasopharynx. Negative scalp soft tissues.  IMPRESSION: 1. Previous 2 small brain metastases have resolved with no residual enhancement. No new metastatic disease identified.  2. Subtle increased cerebral white matter T2 and FLAIR hyperintensity, favor sequelae of whole brain radiation.   Electronically Signed   By: Jacob Webb M.D.   On: 09/11/2013 18:49    AssessmentPlan:. 62 y.o. Jacob Webb, Jacob Webb man with extensive stage small-cell lung cancer admitted with hallucinations, pancytopenia   (1) brain metastases s/p whole brain irradiation completed 07/10/2013, repeat brain MRI yesterday showing complete resolution as noted above  (2) s/p fifth of 5 planned cycles of carboplatin/ etoposide chemotherapy, with good response; currently day 15 cycle 5  (3) pancytopenia secondary to #2; had a transfusion of packed RBCs yesterday with appropriate rise in hemogloblin (7.6 to 10.6 today).  WBC has increased to 6 today but his plts continue to drop (32K -->  19K --> 15K today). He has not had any mucosal bleeding or hematochezia or hematuria.  We will follow closely with a low threshold to give plts for any signs of bleeding.  (4) hallucinations, resolved.  Nurse and patient reports no visual or auditory hallucinations.  Likely secondary to #1.   Will follow with you.  Myra Rude, MD Medical Oncology 09/13/2013  12:45 PM

## 2013-09-13 NOTE — Progress Notes (Signed)
Pt unable to locate a ride.  CSW unable to contact any family.  Left messages with Pt's daughter's boyfriend and with Pt's youngest daughter.  Spoke with Asst Dir, Zack, who instructed CSW to provide Pt with a taxi voucher home if MD felt that Pt has capacity and if Pt has a way to get into the home.  Per MD, Pt has capacity.  Spoke with Pt.  Pt stated that he stays in the back of the house and that he enters it through the back door.  Pt stated that the back door is always unlocked.  Arranged for transportation with [employer].  Pt to be d/c'd.  Providence Crosby, LCSWA Clinical Social Work [phone]

## 2013-09-14 ENCOUNTER — Other Ambulatory Visit: Payer: Medicaid Other | Admitting: Lab

## 2013-09-14 LAB — TYPE AND SCREEN
Antibody Screen: NEGATIVE
Unit division: 0

## 2013-09-21 ENCOUNTER — Ambulatory Visit: Payer: Medicaid Other | Admitting: Internal Medicine

## 2013-09-21 ENCOUNTER — Other Ambulatory Visit: Payer: Medicaid Other | Admitting: Lab

## 2013-10-14 ENCOUNTER — Other Ambulatory Visit: Payer: Self-pay | Admitting: Medical Oncology

## 2013-10-14 DIAGNOSIS — C3490 Malignant neoplasm of unspecified part of unspecified bronchus or lung: Secondary | ICD-10-CM

## 2013-10-14 MED ORDER — HYDROCODONE-ACETAMINOPHEN 5-325 MG PO TABS: 1.0000 | ORAL_TABLET | ORAL | Status: DC | PRN

## 2013-10-14 MED ORDER — PROMETHAZINE HCL 25 MG PO TABS: 25.0000 mg | ORAL_TABLET | Freq: Four times a day (QID) | ORAL | Status: DC | PRN

## 2013-10-14 NOTE — Telephone Encounter (Signed)
Needs refill on phenergan and hydrocodone. Called in phenergan and left hydrocodone in injection room for pt to pick up later this week.

## 2013-10-22 ENCOUNTER — Other Ambulatory Visit: Payer: Self-pay

## 2013-11-26 ENCOUNTER — Other Ambulatory Visit: Payer: Self-pay | Admitting: Medical Oncology

## 2013-11-26 MED ORDER — HYDROCODONE-ACETAMINOPHEN 5-325 MG PO TABS: 1.0000 | ORAL_TABLET | ORAL | Status: DC | PRN

## 2013-11-26 NOTE — Telephone Encounter (Signed)
Requests refill on pain med and will pick up rx on Monday . RX Locked in injection room.Pt notified.

## 2013-11-30 ENCOUNTER — Other Ambulatory Visit (HOSPITAL_BASED_OUTPATIENT_CLINIC_OR_DEPARTMENT_OTHER): Payer: Medicaid Other

## 2013-11-30 ENCOUNTER — Ambulatory Visit (HOSPITAL_COMMUNITY)
Admission: RE | Admit: 2013-11-30 | Discharge: 2013-11-30 | Disposition: A | Discharge status: Home / Self Care | Payer: Medicaid Other | Source: Ambulatory Visit | Attending: Internal Medicine | Admitting: Internal Medicine

## 2013-11-30 ENCOUNTER — Encounter (HOSPITAL_COMMUNITY): Payer: Self-pay

## 2013-11-30 DIAGNOSIS — C7951 Secondary malignant neoplasm of bone: Secondary | ICD-10-CM | POA: Insufficient documentation

## 2013-11-30 DIAGNOSIS — C3410 Malignant neoplasm of upper lobe, unspecified bronchus or lung: Secondary | ICD-10-CM

## 2013-11-30 DIAGNOSIS — M4310 Spondylolisthesis, site unspecified: Secondary | ICD-10-CM | POA: Insufficient documentation

## 2013-11-30 DIAGNOSIS — C3490 Malignant neoplasm of unspecified part of unspecified bronchus or lung: Secondary | ICD-10-CM

## 2013-11-30 DIAGNOSIS — J984 Other disorders of lung: Secondary | ICD-10-CM | POA: Insufficient documentation

## 2013-11-30 DIAGNOSIS — C7931 Secondary malignant neoplasm of brain: Secondary | ICD-10-CM | POA: Insufficient documentation

## 2013-11-30 DIAGNOSIS — I700 Atherosclerosis of aorta: Secondary | ICD-10-CM | POA: Insufficient documentation

## 2013-11-30 DIAGNOSIS — N289 Disorder of kidney and ureter, unspecified: Secondary | ICD-10-CM | POA: Insufficient documentation

## 2013-11-30 DIAGNOSIS — R091 Pleurisy: Secondary | ICD-10-CM | POA: Insufficient documentation

## 2013-11-30 DIAGNOSIS — M948X9 Other specified disorders of cartilage, unspecified sites: Secondary | ICD-10-CM | POA: Insufficient documentation

## 2013-11-30 LAB — COMPREHENSIVE METABOLIC PANEL (CC13)
ALT: 11 U/L (ref 0–55)
AST: 15 U/L (ref 5–34)
Albumin: 3.4 g/dL — ABNORMAL LOW (ref 3.5–5.0)
Anion Gap: 7 mEq/L (ref 3–11)
CO2: 28 mEq/L (ref 22–29)
Calcium: 10.1 mg/dL (ref 8.4–10.4)
Chloride: 107 mEq/L (ref 98–109)
Glucose: 81 mg/dl (ref 70–140)
Sodium: 142 mEq/L (ref 136–145)
Total Bilirubin: 0.36 mg/dL (ref 0.20–1.20)
Total Protein: 7.4 g/dL (ref 6.4–8.3)

## 2013-11-30 LAB — CBC WITH DIFFERENTIAL/PLATELET
BASO%: 0.7 % (ref 0.0–2.0)
Eosinophils Absolute: 0.2 10*3/uL (ref 0.0–0.5)
HCT: 38.7 % (ref 38.4–49.9)
HGB: 13 g/dL (ref 13.0–17.1)
MCHC: 33.5 g/dL (ref 32.0–36.0)
MCV: 92.9 fL (ref 79.3–98.0)
MONO#: 0.8 10*3/uL (ref 0.1–0.9)
NEUT#: 6.3 10*3/uL (ref 1.5–6.5)
RBC: 4.16 10*6/uL — ABNORMAL LOW (ref 4.20–5.82)
WBC: 8.7 10*3/uL (ref 4.0–10.3)
lymph#: 1.2 10*3/uL (ref 0.9–3.3)

## 2013-11-30 MED ORDER — IOHEXOL 300 MG/ML  SOLN
100.0000 mL | Freq: Once | INTRAMUSCULAR | Status: AC | PRN
  Administered 2013-11-30: 100 mL via INTRAVENOUS

## 2013-11-30 MED ORDER — IOHEXOL 300 MG/ML  SOLN
50.0000 mL | Freq: Once | INTRAMUSCULAR | Status: AC | PRN
  Administered 2013-11-30: 50 mL via ORAL

## 2013-12-02 ENCOUNTER — Ambulatory Visit: Payer: Medicaid Other | Admitting: Internal Medicine

## 2013-12-03 ENCOUNTER — Ambulatory Visit (HOSPITAL_BASED_OUTPATIENT_CLINIC_OR_DEPARTMENT_OTHER): Payer: Medicaid Other | Admitting: Internal Medicine

## 2013-12-03 ENCOUNTER — Telehealth: Payer: Self-pay | Admitting: Internal Medicine

## 2013-12-03 VITALS — BP 95/65 | HR 76 | Temp 96.7°F | Resp 18 | Ht 71.0 in | Wt 151.1 lb

## 2013-12-03 DIAGNOSIS — C3490 Malignant neoplasm of unspecified part of unspecified bronchus or lung: Secondary | ICD-10-CM

## 2013-12-03 NOTE — Telephone Encounter (Signed)
appts made per 12/18 POF AVS and CAL given Referral appt made w Dr. Mitzi Hansen shh

## 2013-12-03 NOTE — Progress Notes (Signed)
[facility] [phone]   [phone]  OFFICE PROGRESS NOTE  No PCP Per Patient [address] [place] [address]  DIAGNOSIS AND STAGE: Extensive stage small cell lung cancer diagnosed in May of 2014   PRIOR THERAPY:  1) whole Brain irradiation as well as palliative radiotherapy to the chest mass.  2) Systemic chemotherapy with carboplatin for AUC of 5 on day 1 and etoposide 120 mg/M2 on days 1, 2 and 3 with Neulasta support on day 4, status post 5 cycles. First cycle was started on 05/25/2013.   CURRENT THERAPY: observation.  CHEMOTHERAPY INTENT: Palliative  CURRENT # OF CHEMOTHERAPY CYCLES: 0 CURRENT ANTIEMETICS: Zofran, dexamethasone and Compazine  CURRENT SMOKING STATUS: Current smoker and strongly advised to quit smoking and offered smoke cessation program  ORAL CHEMOTHERAPY AND CONSENT: None  CURRENT BISPHOSPHONATES USE: None  PAIN MANAGEMENT: Well-managed with Percocet when necessary  NARCOTICS INDUCED CONSTIPATION: No constipation but has a stool softener at home  LIVING WILL AND CODE STATUS: Discussed with the patient and he is still Full code.   INTERVAL HISTORY: Jacob Webb 62 y.o. male returns to the clinic today for followup visit. The patient is feeling fine today with no specific complaints except for mild fatigue. He denied having any significant chest pain but continues to have shortness breath with exertion with no cough or hemoptysis. He denied having any fever or chills. The patient denied having any significant weight loss or night sweats.he has been observation for the last 3 months. The patient had repeat CT scan of the chest, abdomen and pelvis performed recently and he is here for evaluation and discussion of his scan results.  MEDICAL HISTORY: Past Medical History  Diagnosis Date  . Cancer     lung ca dx'd 04/2013    ALLERGIES:  has No Known Allergies.  MEDICATIONS:  Current Outpatient Prescriptions    Medication Sig Dispense Refill  . HYDROcodone-acetaminophen (NORCO/VICODIN) 5-325 MG per tablet Take 1-2 tablets by mouth every 4 (four) hours as needed.  30 tablet  0  . omeprazole (PRILOSEC) 40 MG capsule Take 40 mg by mouth daily.      . promethazine (PHENERGAN) 25 MG tablet Take 1 tablet (25 mg total) by mouth every 6 (six) hours as needed for nausea.  30 tablet  0   No current facility-administered medications for this visit.   REVIEW OF SYSTEMS:  Constitutional: positive for fatigue Eyes: negative Ears, nose, mouth, throat, and face: negative Respiratory: positive for dyspnea on exertion Cardiovascular: negative Gastrointestinal: negative Genitourinary:negative Integument/breast: negative Hematologic/lymphatic: negative Musculoskeletal:negative Neurological: negative Behavioral/Psych: negative Endocrine: negative Allergic/Immunologic: negative   PHYSICAL EXAMINATION: General appearance: alert, cooperative, fatigued and no distress Head: Normocephalic, without obvious abnormality, atraumatic Neck: no adenopathy Lymph nodes: Cervical, supraclavicular, and axillary nodes normal. Resp: clear to auscultation bilaterally Back: symmetric, no curvature. ROM normal. No CVA tenderness. Cardio: regular rate and rhythm, S1, S2 normal, no murmur, click, rub or gallop GI: soft, non-tender; bowel sounds normal; no masses,  no organomegaly Extremities: extremities normal, atraumatic, no cyanosis or edema Neurologic: Alert and oriented X 3, normal strength and tone. Normal symmetric reflexes. Normal coordination and gait  ECOG PERFORMANCE STATUS: 1 - Symptomatic but completely ambulatory  Blood pressure 95/65, pulse 76, temperature 96.7 F (35.9 C), temperature source Oral, resp. rate 18, height 5\' 11"  (1.803 m), weight 151 lb 1.6 oz (68.539 kg).  LABORATORY DATA: Lab Results  Component Value Date   WBC 8.7  11/30/2013   HGB 13.0 11/30/2013   HCT 38.7 11/30/2013   MCV 92.9  11/30/2013   PLT 203 11/30/2013      Chemistry      Component Value Date/Time   NA 142 11/30/2013 1041   NA 138 09/13/2013 0815   K 4.7 11/30/2013 1041   K 3.7 09/13/2013 0815   CL 104 09/13/2013 0815   CL 105 06/08/2013 1009   CO2 28 11/30/2013 1041   CO2 26 09/13/2013 0815   BUN 16.7 11/30/2013 1041   BUN 9 09/13/2013 0815   CREATININE 0.8 11/30/2013 1041   CREATININE 0.82 09/13/2013 0815      Component Value Date/Time   CALCIUM 10.1 11/30/2013 1041   CALCIUM 8.8 09/13/2013 0815   ALKPHOS 106 11/30/2013 1041   ALKPHOS 109 09/11/2013 1335   AST 15 11/30/2013 1041   AST 14 09/11/2013 1335   ALT 11 11/30/2013 1041   ALT 16 09/11/2013 1335   BILITOT 0.36 11/30/2013 1041   BILITOT 0.6 09/11/2013 1335       RADIOGRAPHIC STUDIES: Ct Chest W Contrast  11/30/2013   CLINICAL DATA:  Left upper lobe small cell lung cancer diagnosed May 2014. Restaging. Metastatic disease to brain and bone.  EXAM: CT CHEST, ABDOMEN, AND PELVIS WITH CONTRAST  TECHNIQUE: Multidetector CT imaging of the chest, abdomen and pelvis was performed following the standard protocol during bolus administration of intravenous contrast.  CONTRAST:  OMNIPAQUE IOHEXOL 300 MG/ML SOLN, 50mL OMNIPAQUE IOHEXOL 300 MG/ML SOLN  COMPARISON:  PET-CT 05/21/2013, CT 08/28/2013  FINDINGS:   CT CHEST FINDINGS  Sclerotic left T1 metastasis reidentified. Schmorl's node formation noted in the spine. Bilateral L5 pars interarticularis defects reidentified. Occult left femoral lesion not identified with this technique.  Biapical pleural thickening is noted. Small pretracheal and subcarinal nodes are stable. Left upper lobe pulmonary parenchymal mass abutting the pleura with extension to the left suprahilar region is slightly smaller in measured at the same anatomic level as previously, 1.9 x 1.5 cm image 15, compared to 2.1 x 1.9 cm on the previous image 14; these levels were chosen to indicate the maximal measurement of the left upper lobe  residual mass. Diffuse emphysematous changes are reidentified. Curvilinear superior segment left lower lobe probable scarring noted. Areas of bronchial wall thickening in the right lower lobe and tree-in-bud type nodular airspace opacity likely represents small airways infection. This is new since the prior exam. Mild central bronchial wall thickening.    CT ABDOMEN AND PELVIS FINDINGS  Irregular right pararenal mass is larger, now 2.3 x 1.7 cm image 66. Liver, gallbladder, adrenal glands, left kidney, spleen, and pancreas are normal. Moderate atheromatous aortic calcification without aneurysm. Small periaortic nodes are stable.  The appendix is normal. No bowel wall thickening or focal segmental dilatation. Bladder is normal.   IMPRESSION: Mixed response with decrease in size of left upper lobe small cell lung carcinoma but increase in size of right para renal mass most likely metastatic disease given previous imaging appearance.  New tree-in-bud type nodular airspace opacity pattern in the right lower lobe which usually indicates small airways infection.  Sclerosis at the site of previously seen left T1 metastasis, likely treatment effect.   Electronically Signed   By: Christiana Pellant M.D.   On: 11/30/2013 14:43    ASSESSMENT AND PLAN:  This is a very pleasant 62 years old white male with extensive stage small cell lung cancer: he completed 5 cycles of systemic chemotherapy with carboplatin and  etoposide with significant improvement in his disease, and has been observation for the last 3 months with no significant evidence for disease progression except for enlarging right para-enal mass. I discussed the scan results with the patient and showed him the images.  I would referred the patient to Dr. Mitzi Hansen for consideration of palliative radiotherapy to the enlarging right para-renal mass. I would see the patient back for follow up visit in 3 months with repeat CT scan of the chest, abdomen and pelvis for  reevaluation of his disease. If the scan showed any further evidence for disease progression, I would consider the patient for repeat systemic chemotherapy. The patient was advised to call immediately if has any concerning symptoms in the interval.  The patient voices understanding of current disease status and treatment options and is in agreement with the current care plan.  All questions were answered. The patient knows to call the clinic with any problems, questions or concerns. We can certainly see the patient much sooner if necessary.

## 2013-12-05 ENCOUNTER — Encounter: Payer: Self-pay | Admitting: Internal Medicine

## 2013-12-05 NOTE — Patient Instructions (Signed)
Referral to Dr. Mitzi Hansen for consideration of palliative radiotherapy to the mass adjacent to the right kidney. Follow up visit in 3 months with repeat CT scan of the chest, abdomen and pelvis.

## 2013-12-16 NOTE — Progress Notes (Signed)
GI Location of Tumor / Histology:right para-renal mass   Hx extensive stage  small cell  Left upper lobe lung  cancer primary  Patient presented  mild fatigue, sob with exertion Biopsies of lung 04/2013 Past/Anticipated interventions by surgeon, if any:  Past/Anticipated interventions by medical oncology, if any: 2) Systemic chemotherapy with carboplatin for AUC of 5 on day 1 and etoposide 120 mg/M2 on days 1, 2 and 3 with Neulasta support on day 4, status post 5 cycles. First cycle was started on 05/25/2013. CURRENT THERAPY: observation. CHEMOTHERAPY INTENT: Palliative, Dr. Arbutus Ped, CT chest  02/25/14, follow up 03/04/14 with Dr.Mohamed  Weight changes, if any: no  Bowel/Bladder complaints, if any: no  Nausea / Vomiting, if any: no  Pain issues, if any:  Left hip and leg 7 on 1  To 10 scale, last pain med taken yesterday  SAFETY ISSUES:  Prior radiation? Yes,whole brain  06/29/13-07/10/13 30 Gy 10/fxs, and palliative  Thoracic 30 Gy/10 fx  Pacemaker/ICD? no  Possible current pregnancy?no  Is the patient on methotrexate? no  Current Complaints / other details:  Here for palliative radiation

## 2013-12-21 ENCOUNTER — Ambulatory Visit
Admission: RE | Admit: 2013-12-21 | Discharge: 2013-12-21 | Disposition: A | Discharge status: Home / Self Care | Payer: Medicaid Other | Source: Ambulatory Visit | Attending: Radiation Oncology | Admitting: Radiation Oncology

## 2013-12-21 ENCOUNTER — Encounter: Payer: Self-pay | Admitting: Radiation Oncology

## 2013-12-21 ENCOUNTER — Telehealth: Payer: Self-pay | Admitting: *Deleted

## 2013-12-21 ENCOUNTER — Encounter: Payer: Self-pay | Admitting: *Deleted

## 2013-12-21 VITALS — BP 98/70 | HR 72 | Temp 98.4°F | Resp 20 | Ht 71.0 in | Wt 150.0 lb

## 2013-12-21 DIAGNOSIS — C772 Secondary and unspecified malignant neoplasm of intra-abdominal lymph nodes: Secondary | ICD-10-CM

## 2013-12-21 DIAGNOSIS — M545 Low back pain, unspecified: Secondary | ICD-10-CM | POA: Insufficient documentation

## 2013-12-21 DIAGNOSIS — M25559 Pain in unspecified hip: Secondary | ICD-10-CM | POA: Insufficient documentation

## 2013-12-21 DIAGNOSIS — Z923 Personal history of irradiation: Secondary | ICD-10-CM | POA: Insufficient documentation

## 2013-12-21 DIAGNOSIS — D4959 Neoplasm of unspecified behavior of other genitourinary organ: Secondary | ICD-10-CM | POA: Insufficient documentation

## 2013-12-21 DIAGNOSIS — C801 Malignant (primary) neoplasm, unspecified: Secondary | ICD-10-CM | POA: Insufficient documentation

## 2013-12-21 DIAGNOSIS — C3490 Malignant neoplasm of unspecified part of unspecified bronchus or lung: Secondary | ICD-10-CM

## 2013-12-21 DIAGNOSIS — M79609 Pain in unspecified limb: Secondary | ICD-10-CM | POA: Insufficient documentation

## 2013-12-21 DIAGNOSIS — Z9221 Personal history of antineoplastic chemotherapy: Secondary | ICD-10-CM | POA: Insufficient documentation

## 2013-12-21 MED ORDER — HYDROCODONE-ACETAMINOPHEN 5-325 MG PO TABS: 1.0000 | ORAL_TABLET | ORAL | Status: DC | PRN

## 2013-12-21 NOTE — Progress Notes (Signed)
Please see the Nurse Progress Note in the MD Initial Consult Encounter for this patient. 

## 2013-12-21 NOTE — Progress Notes (Signed)
Radiation Oncology         [phone]) [phone] ________________________________  Name: Jacob Webb MRN: [id number]  Date: 12/21/2013  DOB: Nov 03, 1951  Follow-Up Visit Note  CC: No PCP Per Patient  Curt Bears, MD  Diagnosis:   Metastatic small cell lung cancer  Interval Since Last Radiation:  The patient completed whole brain radiation treatment and palliative thoracic radiation treatment in July of 2014   Narrative:  The patient returns today for routine follow-up.  The patient has continued followup/management with Dr. Earlie Server in medical oncology. He has recently been on observation after completing a course of carboplatin and etoposide. The patient had a restaging CT scan completed and discussed these results with Dr. Julien Nordmann. The results were indicative of a mixed response with a decrease in the size of the left upper lobe lung carcinoma but there was an increase in the size of a right-sided para-renal tumor which is behaving consistent with a metastasis. This now measures 2.3 cm. I have been asked to see the patient for evaluation for possible palliative radiation treatment to this side.  Overall the patient states that he is doing reasonably well. However, the patient does have a new complaint of some left hip and radiating pain to the left lower extremity. He indicates that this pain is severe at times and he now describes this as a 7/10. The pain he feels begins in the posterior left hip/lower back and radiates to the left lab. No weakness or other associated findings.                              ALLERGIES:  has No Known Allergies.  Meds: Current Outpatient Prescriptions  Medication Sig Dispense Refill  . HYDROcodone-acetaminophen (NORCO/VICODIN) 5-325 MG per tablet Take 1-2 tablets by mouth every 4 (four) hours as needed.  40 tablet  0  . omeprazole (PRILOSEC) 40 MG capsule Take 40 mg by mouth daily.      . promethazine (PHENERGAN) 25 MG tablet Take 1 tablet (25 mg total) by  mouth every 6 (six) hours as needed for nausea.  30 tablet  0   No current facility-administered medications for this encounter.    Physical Findings: The patient is in no acute distress. Patient is alert and oriented.  height is 5\' 11"  (1.803 m) and weight is 150 lb (68.04 kg). His oral temperature is 98.4 F (36.9 C). His blood pressure is 98/70 and his pulse is 72. His respiration is 20 and oxygen saturation is 100%. .   Ferrous dictation bilaterally No focal deficits, 5 out of 5 strength in the bilateral lower extremities Spine nontender to palpation  Lab Findings: Lab Results  Component Value Date   WBC 8.7 11/30/2013   HGB 13.0 11/30/2013   HCT 38.7 11/30/2013   MCV 92.9 11/30/2013   PLT 203 11/30/2013     Radiographic Findings: Ct Chest W Contrast  11/30/2013   CLINICAL DATA:  Left upper lobe small cell lung cancer diagnosed May 2014. Restaging. Metastatic disease to brain and bone.  EXAM: CT CHEST, ABDOMEN, AND PELVIS WITH CONTRAST  TECHNIQUE: Multidetector CT imaging of the chest, abdomen and pelvis was performed following the standard protocol during bolus administration of intravenous contrast.  CONTRAST:  115mL OMNIPAQUE IOHEXOL 300 MG/ML SOLN, 35mL OMNIPAQUE IOHEXOL 300 MG/ML SOLN  COMPARISON:  PET-CT 05/21/2013, CT 08/28/2013  FINDINGS: CT CHEST FINDINGS  Sclerotic left T1 metastasis reidentified. Schmorl's node formation noted in  the spine. Bilateral L5 pars interarticularis defects reidentified. Occult left femoral lesion not identified with this technique.  Biapical pleural thickening is noted. Small pretracheal and subcarinal nodes are stable. Left upper lobe pulmonary parenchymal mass abutting the pleura with extension to the left suprahilar region is slightly smaller in measured at the same anatomic level as previously, 1.9 x 1.5 cm image 15, compared to 2.1 x 1.9 cm on the previous image 14; these levels were chosen to indicate the maximal measurement of the left upper  lobe residual mass. Diffuse emphysematous changes are reidentified. Curvilinear superior segment left lower lobe probable scarring noted. Areas of bronchial wall thickening in the right lower lobe and tree-in-bud type nodular airspace opacity likely represents small airways infection. This is new since the prior exam. Mild central bronchial wall thickening.  CT ABDOMEN AND PELVIS FINDINGS  Irregular right pararenal mass is larger, now 2.3 x 1.7 cm image 66. Liver, gallbladder, adrenal glands, left kidney, spleen, and pancreas are normal. Moderate atheromatous aortic calcification without aneurysm. Small periaortic nodes are stable.  The appendix is normal. No bowel wall thickening or focal segmental dilatation. Bladder is normal.  IMPRESSION: Mixed response with decrease in size of left upper lobe small cell lung carcinoma but increase in size of right para renal mass most likely metastatic disease given previous imaging appearance.  New tree-in-bud type nodular airspace opacity pattern in the right lower lobe which usually indicates small airways infection.  Sclerosis at the site of previously seen left T1 metastasis, likely treatment effect.   Electronically Signed   By: Conchita Paris M.D.   On: 11/30/2013 14:43   Ct Abdomen Pelvis W Contrast  11/30/2013   CLINICAL DATA:  Left upper lobe small cell lung cancer diagnosed May 2014. Restaging. Metastatic disease to brain and bone.  EXAM: CT CHEST, ABDOMEN, AND PELVIS WITH CONTRAST  TECHNIQUE: Multidetector CT imaging of the chest, abdomen and pelvis was performed following the standard protocol during bolus administration of intravenous contrast.  CONTRAST:  134mL OMNIPAQUE IOHEXOL 300 MG/ML SOLN, 16mL OMNIPAQUE IOHEXOL 300 MG/ML SOLN  COMPARISON:  PET-CT 05/21/2013, CT 08/28/2013  FINDINGS: CT CHEST FINDINGS  Sclerotic left T1 metastasis reidentified. Schmorl's node formation noted in the spine. Bilateral L5 pars interarticularis defects reidentified. Occult  left femoral lesion not identified with this technique.  Biapical pleural thickening is noted. Small pretracheal and subcarinal nodes are stable. Left upper lobe pulmonary parenchymal mass abutting the pleura with extension to the left suprahilar region is slightly smaller in measured at the same anatomic level as previously, 1.9 x 1.5 cm image 15, compared to 2.1 x 1.9 cm on the previous image 14; these levels were chosen to indicate the maximal measurement of the left upper lobe residual mass. Diffuse emphysematous changes are reidentified. Curvilinear superior segment left lower lobe probable scarring noted. Areas of bronchial wall thickening in the right lower lobe and tree-in-bud type nodular airspace opacity likely represents small airways infection. This is new since the prior exam. Mild central bronchial wall thickening.  CT ABDOMEN AND PELVIS FINDINGS  Irregular right pararenal mass is larger, now 2.3 x 1.7 cm image 66. Liver, gallbladder, adrenal glands, left kidney, spleen, and pancreas are normal. Moderate atheromatous aortic calcification without aneurysm. Small periaortic nodes are stable.  The appendix is normal. No bowel wall thickening or focal segmental dilatation. Bladder is normal.  IMPRESSION: Mixed response with decrease in size of left upper lobe small cell lung carcinoma but increase in size of right para  renal mass most likely metastatic disease given previous imaging appearance.  New tree-in-bud type nodular airspace opacity pattern in the right lower lobe which usually indicates small airways infection.  Sclerosis at the site of previously seen left T1 metastasis, likely treatment effect.   Electronically Signed   By: Conchita Paris M.D.   On: 11/30/2013 14:43    Impression:    The patient is a pleasant 63 year old male with a diagnosis of extensive stage small cell lung cancer status post whole brain radiation treatment and palliative thoracic radiation treatment completed in July of  2014. He now is on observation. One area of concern was seen on his last restaging study with enlargement of a right sided para-renal tumor. I believe that the patient is appropriate for palliative radiation treatment to this area. We discussed the benefit of such treatment in terms of tumor shrinkage. We also discussed the possible side effects and risks of treatment as well. All the patient's questions were answered.      Plan:  The patient will be scheduled for a simulation in the near future such that we can proceed with treatment planning  I will order an MRI scan to evaluate the patient's radiating pain to the left lower extremity. We will proceed with a simulation after the scan has been completed.  I spent 15 minutes with the patient today, the majority of which was spent counseling the patient on the diagnosis of cancer and coordinating care.   Jodelle Gross, M.D., Ph.D.

## 2013-12-21 NOTE — Telephone Encounter (Signed)
Called patient to inform of tests, lvm for a return call

## 2013-12-25 ENCOUNTER — Other Ambulatory Visit: Payer: Self-pay | Admitting: Radiation Oncology

## 2013-12-25 DIAGNOSIS — C7952 Secondary malignant neoplasm of bone marrow: Principal | ICD-10-CM

## 2013-12-25 DIAGNOSIS — C7951 Secondary malignant neoplasm of bone: Secondary | ICD-10-CM

## 2013-12-28 ENCOUNTER — Ambulatory Visit (HOSPITAL_COMMUNITY): Payer: Medicaid Other

## 2013-12-28 ENCOUNTER — Inpatient Hospital Stay (HOSPITAL_COMMUNITY): Admission: RE | Admit: 2013-12-28 | Payer: Medicaid Other | Source: Ambulatory Visit

## 2013-12-28 ENCOUNTER — Telehealth: Payer: Self-pay | Admitting: *Deleted

## 2013-12-28 NOTE — Telephone Encounter (Signed)
CALLED PATIENT TO INFORM THAT MRI HAS BEEN CANCELLED DUE TO THE INSURANCE NOT APPROVING IT, BONE SCAN WAS SCHEDULED DUE TO MRI BEING CANCELLED PER DR, MOODY, SPOKE WITH PATIENT AND HE IS AWARE OF HIS BONE SCAN ON 01-12-14.

## 2013-12-30 ENCOUNTER — Ambulatory Visit
Admission: RE | Admit: 2013-12-30 | Discharge: 2013-12-30 | Disposition: A | Discharge status: Home / Self Care | Payer: Medicaid Other | Source: Ambulatory Visit | Attending: Radiation Oncology | Admitting: Radiation Oncology

## 2013-12-30 DIAGNOSIS — Z79899 Other long term (current) drug therapy: Secondary | ICD-10-CM | POA: Insufficient documentation

## 2013-12-30 DIAGNOSIS — Z510 Encounter for antineoplastic radiation therapy: Secondary | ICD-10-CM | POA: Insufficient documentation

## 2013-12-30 DIAGNOSIS — C772 Secondary and unspecified malignant neoplasm of intra-abdominal lymph nodes: Secondary | ICD-10-CM

## 2013-12-30 DIAGNOSIS — M25519 Pain in unspecified shoulder: Secondary | ICD-10-CM | POA: Insufficient documentation

## 2013-12-30 DIAGNOSIS — C3490 Malignant neoplasm of unspecified part of unspecified bronchus or lung: Secondary | ICD-10-CM | POA: Insufficient documentation

## 2013-12-31 NOTE — Progress Notes (Signed)
[facility] Psychosocial Distress Screening Clinical Social Work  Clinical Social Work was referred by distress screening protocol.  The patient scored a 7 on the Psychosocial Distress Thermometer which indicates moderate distress. Clinical Social Worker met with patient in exam room to assess for distress and other psychosocial needs. CSW actively involved and working with patient. [name] states he has no new needs and reports he will contact CSW as needed.   Clinical Social Worker follow up needed: no  If yes, follow up plan:   Jacob Webb, MSW, LCSW, OSW-C Clinical Social Worker [facility] [phone]

## 2014-01-01 NOTE — Progress Notes (Addendum)
  Radiation Oncology         [phone] ________________________________  Name: Nat Lowenthal MRN: [id number]  Date: 12/30/2013  DOB: Feb 07, 1951  SIMULATION AND TREATMENT PLANNING NOTE  DIAGNOSIS:  Metastatic small cell lung cancer  NARRATIVE:  The patient was brought to the [facility].  Identity was confirmed.  All relevant records and images related to the planned course of therapy were reviewed.   Written consent to proceed with treatment was confirmed which was freely given after reviewing the details related to the planned course of therapy had been reviewed with the patient.  Then, the patient was set-up in a stable reproducible  supine position for radiation therapy.  CT images were obtained.  Surface markings were placed.    Medically necessary complex treatment device(s) for immobilization:  Customized VAC lock bag.   The CT images were loaded into the planning software.  Then the target and avoidance structures were contoured.  Treatment planning then occurred.  The radiation prescription was entered and confirmed.  A total of 2 complex treatment devices were fabricated which relate to the designed radiation treatment fields. Each of these customized fields/ complex treatment devices will be used on a daily basis during the radiation course. I have requested : 3D Simulation  I have requested a DVH of the following structures: ITV,PTV, right kidney, liver.   The patient will undergo daily image guidance to ensure accurate localization of the target, and adequate minimize dose to the normal surrounding structures in close proximity to the target.   PLAN:  The patient will receive 30 Gy in 10 fractions.  ________________________________   Jodelle Gross, MD, PhD

## 2014-01-01 NOTE — Progress Notes (Signed)
  Radiation Oncology         [phone]) [phone] ________________________________  Name: Jacob Webb MRN: [id number]  Date: 12/30/2013  DOB: Mar 31, 1951  RESPIRATORY MOTION MANAGEMENT SIMULATION  NARRATIVE:  In order to account for effect of respiratory motion on target structures and other organs in the planning and delivery of radiotherapy, this patient underwent respiratory motion management simulation.  To accomplish this, when the patient was brought to the CT simulation planning suite, 4D respiratoy motion management CT images were obtained.  The CT images were loaded into the planning software.  Then, using a variety of tools including Cine, MIP, and standard views, the target volume and planning target volumes (PTV) were delineated.  Avoidance structures were contoured.  Treatment planning then occurred.  Dose volume histograms were generated and reviewed for each of the requested structure.  The resulting plan was carefully reviewed and approved today.  ------------------------------------------------  Jodelle Gross, MD, PhD

## 2014-01-06 ENCOUNTER — Ambulatory Visit
Admission: RE | Admit: 2014-01-06 | Discharge: 2014-01-06 | Disposition: A | Discharge status: Home / Self Care | Payer: Medicaid Other | Source: Ambulatory Visit | Attending: Radiation Oncology | Admitting: Radiation Oncology

## 2014-01-06 DIAGNOSIS — C772 Secondary and unspecified malignant neoplasm of intra-abdominal lymph nodes: Secondary | ICD-10-CM

## 2014-01-06 NOTE — Progress Notes (Signed)
  Radiation Oncology         [phone]) [phone] ________________________________  Name: Jacob Webb MRN: [id number]  Date: 01/06/2014  DOB: 08-Oct-1951  Simulation Verification Note  Status: outpatient  NARRATIVE: The patient was brought to the treatment unit and placed in the planned treatment position. The clinical setup was verified. Then port films were obtained and uploaded to the radiation oncology medical record software.  The treatment beams were carefully compared against the planned radiation fields. The position location and shape of the radiation fields was reviewed. They targeted volume of tissue appears to be appropriately covered by the radiation beams. Organs at risk appear to be excluded as planned.  Based on my personal review, I approved the simulation verification. The patient's treatment will proceed as planned.  -----------------------------------  Blair Promise, PhD, MD

## 2014-01-07 ENCOUNTER — Ambulatory Visit
Admission: RE | Admit: 2014-01-07 | Discharge: 2014-01-07 | Disposition: A | Discharge status: Home / Self Care | Payer: Medicaid Other | Source: Ambulatory Visit | Attending: Radiation Oncology | Admitting: Radiation Oncology

## 2014-01-08 ENCOUNTER — Ambulatory Visit: Admission: RE | Admit: 2014-01-08 | Payer: Medicaid Other | Source: Ambulatory Visit | Admitting: Radiation Oncology

## 2014-01-08 ENCOUNTER — Telehealth: Payer: Self-pay | Admitting: *Deleted

## 2014-01-08 ENCOUNTER — Ambulatory Visit: Payer: Medicaid Other

## 2014-01-08 NOTE — Telephone Encounter (Signed)
error 

## 2014-01-08 NOTE — Telephone Encounter (Signed)
Called patient asked if he was running late for his appt rad tx time, he stated he has car trouble and  Would be in Monday at 440pm, will let therapist know.now

## 2014-01-11 ENCOUNTER — Ambulatory Visit
Admission: RE | Admit: 2014-01-11 | Discharge: 2014-01-11 | Disposition: A | Discharge status: Home / Self Care | Payer: Medicaid Other | Source: Ambulatory Visit | Attending: Radiation Oncology | Admitting: Radiation Oncology

## 2014-01-12 ENCOUNTER — Ambulatory Visit
Admission: RE | Admit: 2014-01-12 | Discharge: 2014-01-12 | Disposition: A | Discharge status: Home / Self Care | Payer: Medicaid Other | Source: Ambulatory Visit | Attending: Radiation Oncology | Admitting: Radiation Oncology

## 2014-01-12 ENCOUNTER — Telehealth: Payer: Self-pay | Admitting: *Deleted

## 2014-01-12 ENCOUNTER — Ambulatory Visit
Admission: RE | Admit: 2014-01-12 | Payer: Medicaid Other | Source: Ambulatory Visit | Attending: Radiation Oncology | Admitting: Radiation Oncology

## 2014-01-12 ENCOUNTER — Encounter (HOSPITAL_COMMUNITY): Admission: RE | Admit: 2014-01-12 | Payer: Medicaid Other | Source: Ambulatory Visit

## 2014-01-12 ENCOUNTER — Ambulatory Visit (HOSPITAL_COMMUNITY): Admission: RE | Admit: 2014-01-12 | Payer: Medicaid Other | Source: Ambulatory Visit

## 2014-01-12 DIAGNOSIS — C772 Secondary and unspecified malignant neoplasm of intra-abdominal lymph nodes: Secondary | ICD-10-CM

## 2014-01-12 NOTE — Progress Notes (Signed)
   Department of Radiation Oncology  Phone:  [phone] Fax:        [phone]  Weekly Treatment Note    Name: Jacob Webb Date: 01/12/2014 MRN: [id number] DOB: 07/16/1951   Current dose: 6 Gy  Current fraction: 2   MEDICATIONS: Current Outpatient Prescriptions  Medication Sig Dispense Refill  . HYDROcodone-acetaminophen (NORCO/VICODIN) 5-325 MG per tablet Take 1-2 tablets by mouth every 4 (four) hours as needed.  40 tablet  0  . omeprazole (PRILOSEC) 40 MG capsule Take 40 mg by mouth daily.      . promethazine (PHENERGAN) 25 MG tablet Take 1 tablet (25 mg total) by mouth every 6 (six) hours as needed for nausea.  30 tablet  0   No current facility-administered medications for this encounter.     ALLERGIES: Review of patient's allergies indicates no known allergies.   LABORATORY DATA:  Lab Results  Component Value Date   WBC 8.7 11/30/2013   HGB 13.0 11/30/2013   HCT 38.7 11/30/2013   MCV 92.9 11/30/2013   PLT 203 11/30/2013   Lab Results  Component Value Date   NA 142 11/30/2013   K 4.7 11/30/2013   CL 104 09/13/2013   CO2 28 11/30/2013   Lab Results  Component Value Date   ALT 11 11/30/2013   AST 15 11/30/2013   ALKPHOS 106 11/30/2013   BILITOT 0.36 11/30/2013     NARRATIVE: Jacob Webb was seen today for weekly treatment management. The chart was checked and the patient's films were reviewed. The patient is doing very well with treatment. No difficulties with nausea. No diarrhea.  PHYSICAL EXAMINATION:  Alert, in no acute distress.     ASSESSMENT: The patient is doing satisfactorily with treatment.  PLAN: We will continue with the patient's radiation treatment as planned.

## 2014-01-12 NOTE — Telephone Encounter (Signed)
xxxx 

## 2014-01-12 NOTE — Telephone Encounter (Signed)
Called patient to inform of test on 01-20-14, spoke with patient and he is aware of this test.

## 2014-01-13 ENCOUNTER — Ambulatory Visit
Admission: RE | Admit: 2014-01-13 | Discharge: 2014-01-13 | Disposition: A | Discharge status: Home / Self Care | Payer: Medicaid Other | Source: Ambulatory Visit | Attending: Radiation Oncology | Admitting: Radiation Oncology

## 2014-01-13 ENCOUNTER — Other Ambulatory Visit: Payer: Self-pay | Admitting: Radiation Oncology

## 2014-01-13 DIAGNOSIS — C772 Secondary and unspecified malignant neoplasm of intra-abdominal lymph nodes: Secondary | ICD-10-CM

## 2014-01-13 MED ORDER — HYDROCODONE-ACETAMINOPHEN 5-325 MG PO TABS: 1.0000 | ORAL_TABLET | ORAL | Status: DC | PRN

## 2014-01-13 NOTE — Progress Notes (Signed)
Jacob Webb here for post sim education.  He stated that he already has the Radiation Therapy and You book.  Discussed potential side effects including diarrhea, nausea, fatigue, skin changes and bladder changes.  He was educated about under treat day with Dr. Lisbeth Renshaw on Fridays.  He was advised to call with any questions or concerns.

## 2014-01-14 ENCOUNTER — Ambulatory Visit
Admission: RE | Admit: 2014-01-14 | Discharge: 2014-01-14 | Disposition: A | Discharge status: Home / Self Care | Payer: Medicaid Other | Source: Ambulatory Visit | Attending: Radiation Oncology | Admitting: Radiation Oncology

## 2014-01-14 ENCOUNTER — Telehealth: Payer: Self-pay | Admitting: *Deleted

## 2014-01-14 NOTE — Telephone Encounter (Signed)
Already spoke with patient will come straight to nursing so won't be delayed in her 2nd appt today 10:33 AM

## 2014-01-14 NOTE — Telephone Encounter (Signed)
Patient but answering voice said person not taking calls at this time try again later, will let therapist know to send patient around after rad tx to pick up rx pain medication 10:31 AM

## 2014-01-15 ENCOUNTER — Ambulatory Visit
Admission: RE | Admit: 2014-01-15 | Discharge: 2014-01-15 | Disposition: A | Discharge status: Home / Self Care | Payer: Medicaid Other | Source: Ambulatory Visit | Attending: Radiation Oncology | Admitting: Radiation Oncology

## 2014-01-15 ENCOUNTER — Encounter: Payer: Self-pay | Admitting: Radiation Oncology

## 2014-01-15 VITALS — BP 104/71 | HR 71 | Temp 98.2°F | Resp 20 | Wt 152.7 lb

## 2014-01-15 DIAGNOSIS — C772 Secondary and unspecified malignant neoplasm of intra-abdominal lymph nodes: Secondary | ICD-10-CM

## 2014-01-15 NOTE — Progress Notes (Signed)
   Department of Radiation Oncology  Phone:  [phone] Fax:        [phone]  Weekly Treatment Note    Name: Jacob Webb Date: 01/15/2014 MRN: [id number] DOB: November 17, 1951   Current dose: 18 Gy  Current fraction: 6   MEDICATIONS: Current Outpatient Prescriptions  Medication Sig Dispense Refill  . HYDROcodone-acetaminophen (NORCO/VICODIN) 5-325 MG per tablet Take 1-2 tablets by mouth every 4 (four) hours as needed.  60 tablet  0  . omeprazole (PRILOSEC) 40 MG capsule Take 40 mg by mouth daily.      . promethazine (PHENERGAN) 25 MG tablet Take 1 tablet (25 mg total) by mouth every 6 (six) hours as needed for nausea.  30 tablet  0   No current facility-administered medications for this encounter.     ALLERGIES: Review of patient's allergies indicates no known allergies.   LABORATORY DATA:  Lab Results  Component Value Date   WBC 8.7 11/30/2013   HGB 13.0 11/30/2013   HCT 38.7 11/30/2013   MCV 92.9 11/30/2013   PLT 203 11/30/2013   Lab Results  Component Value Date   NA 142 11/30/2013   K 4.7 11/30/2013   CL 104 09/13/2013   CO2 28 11/30/2013   Lab Results  Component Value Date   ALT 11 11/30/2013   AST 15 11/30/2013   ALKPHOS 106 11/30/2013   BILITOT 0.36 11/30/2013     NARRATIVE: [name] was seen today for weekly treatment management. The chart was checked and the patient's films were reviewed. The patient is doing well with treatment. No nausea and no GI complaints otherwise such as diarrhea. The patient does complain of some pain in the right shoulder over the last couple of weeks. I do not see any sign of cancer on his imaging in this area. This included a PET scan although this was from the summer which is somewhat dated. In reviewing his recent CT scan however I don't see substantial changes either.  PHYSICAL EXAMINATION: weight is 152 lb 11.2 oz (69.264 kg). His oral temperature is 98.2 F (36.8 C). His blood pressure is 104/71 and his pulse  is 71. His respiration is 20.        ASSESSMENT: The patient is doing satisfactorily with treatment.  PLAN: We will continue with the patient's radiation treatment as planned. The patient is doing very well with her treatment. If his pain in the shoulder persists then this may require further workup. For now he treat this symptomatically with pain medication as he has been doing.

## 2014-01-15 NOTE — Progress Notes (Addendum)
Pt c/o pain in his right shoudler 7/10. He states he has taken Advil prn, Hydrocodone 2 tabs prn without good relief. He states he has discussed his right shoulder pain w/Dr Lisbeth Renshaw. Pt states his appetite is good; he is fatigued. He denies nausea.

## 2014-01-15 NOTE — Addendum Note (Signed)
Encounter addended by: Rebecca Eaton, RN on: 01/15/2014 11:01 AM<BR>     Documentation filed: Charges VN

## 2014-01-18 ENCOUNTER — Ambulatory Visit
Admission: RE | Admit: 2014-01-18 | Discharge: 2014-01-18 | Disposition: A | Discharge status: Home / Self Care | Payer: Medicaid Other | Source: Ambulatory Visit | Attending: Radiation Oncology | Admitting: Radiation Oncology

## 2014-01-19 ENCOUNTER — Ambulatory Visit
Admission: RE | Admit: 2014-01-19 | Discharge: 2014-01-19 | Disposition: A | Discharge status: Home / Self Care | Payer: Medicaid Other | Source: Ambulatory Visit | Attending: Radiation Oncology | Admitting: Radiation Oncology

## 2014-01-20 ENCOUNTER — Encounter (HOSPITAL_COMMUNITY): Payer: Medicaid Other

## 2014-01-20 ENCOUNTER — Ambulatory Visit
Admission: RE | Admit: 2014-01-20 | Discharge: 2014-01-20 | Disposition: A | Discharge status: Home / Self Care | Payer: Medicaid Other | Source: Ambulatory Visit | Attending: Radiation Oncology | Admitting: Radiation Oncology

## 2014-01-20 ENCOUNTER — Ambulatory Visit: Payer: Medicaid Other

## 2014-01-21 ENCOUNTER — Ambulatory Visit: Payer: Medicaid Other

## 2014-01-22 ENCOUNTER — Ambulatory Visit: Payer: Medicaid Other

## 2014-01-25 ENCOUNTER — Ambulatory Visit: Payer: Medicaid Other

## 2014-01-26 ENCOUNTER — Ambulatory Visit
Admission: RE | Admit: 2014-01-26 | Discharge: 2014-01-26 | Disposition: A | Discharge status: Home / Self Care | Payer: Medicaid Other | Source: Ambulatory Visit | Attending: Radiation Oncology | Admitting: Radiation Oncology

## 2014-01-26 ENCOUNTER — Telehealth: Payer: Self-pay | Admitting: *Deleted

## 2014-01-26 ENCOUNTER — Encounter: Payer: Self-pay | Admitting: Radiation Oncology

## 2014-01-26 NOTE — Telephone Encounter (Signed)
ormed patient didn't show up for last treatment today , called his home phone, per [employer] message ,person not available at this time, try call again later. 9:29 AM

## 2014-01-26 NOTE — Telephone Encounter (Signed)
error 

## 2014-01-27 ENCOUNTER — Ambulatory Visit: Payer: Medicaid Other

## 2014-02-03 ENCOUNTER — Ambulatory Visit (HOSPITAL_COMMUNITY): Admission: RE | Admit: 2014-02-03 | Payer: Medicaid Other | Source: Ambulatory Visit

## 2014-02-03 ENCOUNTER — Ambulatory Visit (HOSPITAL_COMMUNITY): Payer: Medicaid Other

## 2014-02-03 NOTE — Progress Notes (Signed)
  Radiation Oncology         [phone] ________________________________  Name: Jacob Webb MRN: [id number]  Date: 01/26/2014  DOB: 13-Jul-1951  End of Treatment Note  Diagnosis:   Metastatic small cell lung cancer     Indication for treatment:  Palliative       Radiation treatment dates:   01/07/2014 through 01/26/2014  Site/dose:   The patient was treated to a abdominal metastasis adjacent to the right kidney. The patient was treated using a 3-D conformal technique to a dose of 30 gray in 10 fractions. Daily image guidance was used  Narrative: The patient tolerated radiation treatment relatively well.   The patient did not experience any significant nausea, diarrhea, or other significant GI toxicity during the treatment.  Plan: The patient has completed radiation treatment. The patient will return to radiation oncology clinic for routine followup in one month. I advised the patient to call or return sooner if they have any questions or concerns related to their recovery or treatment. ________________________________  Jodelle Gross, M.D., Ph.D.

## 2014-02-04 NOTE — Addendum Note (Signed)
Encounter addended by: Marye Round, MD on: 02/04/2014 11:23 AM<BR>     Documentation filed: Notes Section

## 2014-02-11 ENCOUNTER — Telehealth: Payer: Self-pay | Admitting: *Deleted

## 2014-02-11 NOTE — Telephone Encounter (Signed)
Called patient to remind of test for 02-25-14, spoke with Jacob Webb and he understands that he has a test on 02-25-14

## 2014-02-17 ENCOUNTER — Other Ambulatory Visit: Payer: Self-pay | Admitting: Radiation Oncology

## 2014-02-17 MED ORDER — HYDROCODONE-ACETAMINOPHEN 5-325 MG PO TABS: 1.0000 | ORAL_TABLET | ORAL | Status: DC | PRN

## 2014-02-18 ENCOUNTER — Telehealth: Payer: Self-pay | Admitting: *Deleted

## 2014-02-18 NOTE — Telephone Encounter (Signed)
Called pateint home phone "Not available per voice answering machine,called  Work number  Left message to have patient call back- His rx is ready.now

## 2014-02-18 NOTE — Telephone Encounter (Signed)
Again, he answered  informed him he can pick up px at nursing

## 2014-02-19 ENCOUNTER — Telehealth: Payer: Self-pay | Admitting: *Deleted

## 2014-02-19 NOTE — Telephone Encounter (Signed)
error 

## 2014-02-19 NOTE — Telephone Encounter (Signed)
Called to remind patient rx pain med is here , patient will try to get someone to bring him here, he also stated he has court on this coming Thursday at [place], can he change is appts a day before or after for bone scan, transferred to [employer],  1:14 PM

## 2014-02-24 ENCOUNTER — Ambulatory Visit: Admission: RE | Admit: 2014-02-24 | Payer: Medicaid Other | Source: Ambulatory Visit | Admitting: Radiation Oncology

## 2014-02-25 ENCOUNTER — Ambulatory Visit (HOSPITAL_COMMUNITY): Payer: Medicaid Other

## 2014-02-25 ENCOUNTER — Other Ambulatory Visit: Payer: Medicaid Other

## 2014-02-25 ENCOUNTER — Encounter (HOSPITAL_COMMUNITY): Payer: Medicaid Other

## 2014-02-26 ENCOUNTER — Telehealth: Payer: Self-pay | Admitting: *Deleted

## 2014-02-26 ENCOUNTER — Other Ambulatory Visit: Payer: Self-pay | Admitting: *Deleted

## 2014-02-26 NOTE — Telephone Encounter (Signed)
Called and left msg on work # to call Dr [facility] RN regarding appts needing to be r/s due to CT scan being delayed until 3/19.  SLJ

## 2014-02-26 NOTE — Telephone Encounter (Signed)
Called patient to ask about rescheduling bone scan, no answer will call later.

## 2014-03-02 ENCOUNTER — Telehealth: Payer: Self-pay | Admitting: Internal Medicine

## 2014-03-02 NOTE — Telephone Encounter (Signed)
, °

## 2014-03-04 ENCOUNTER — Ambulatory Visit: Payer: Medicaid Other | Admitting: Internal Medicine

## 2014-03-04 ENCOUNTER — Encounter (HOSPITAL_COMMUNITY)
Admission: RE | Admit: 2014-03-04 | Discharge: 2014-03-04 | Disposition: A | Discharge status: Home / Self Care | Payer: Medicaid Other | Source: Ambulatory Visit | Attending: Radiation Oncology | Admitting: Radiation Oncology

## 2014-03-04 ENCOUNTER — Ambulatory Visit (HOSPITAL_COMMUNITY)
Admission: RE | Admit: 2014-03-04 | Discharge: 2014-03-04 | Disposition: A | Discharge status: Home / Self Care | Payer: Medicaid Other | Source: Ambulatory Visit | Attending: Internal Medicine | Admitting: Internal Medicine

## 2014-03-04 ENCOUNTER — Other Ambulatory Visit (HOSPITAL_BASED_OUTPATIENT_CLINIC_OR_DEPARTMENT_OTHER): Payer: Medicaid Other

## 2014-03-04 DIAGNOSIS — N289 Disorder of kidney and ureter, unspecified: Secondary | ICD-10-CM | POA: Insufficient documentation

## 2014-03-04 DIAGNOSIS — C7952 Secondary malignant neoplasm of bone marrow: Principal | ICD-10-CM

## 2014-03-04 DIAGNOSIS — C7951 Secondary malignant neoplasm of bone: Secondary | ICD-10-CM

## 2014-03-04 DIAGNOSIS — C3490 Malignant neoplasm of unspecified part of unspecified bronchus or lung: Secondary | ICD-10-CM | POA: Insufficient documentation

## 2014-03-04 DIAGNOSIS — J438 Other emphysema: Secondary | ICD-10-CM | POA: Insufficient documentation

## 2014-03-04 DIAGNOSIS — C3410 Malignant neoplasm of upper lobe, unspecified bronchus or lung: Secondary | ICD-10-CM

## 2014-03-04 DIAGNOSIS — R599 Enlarged lymph nodes, unspecified: Secondary | ICD-10-CM | POA: Insufficient documentation

## 2014-03-04 LAB — CBC WITH DIFFERENTIAL/PLATELET
BASO%: 0.5 % (ref 0.0–2.0)
Basophils Absolute: 0 10*3/uL (ref 0.0–0.1)
EOS ABS: 0.2 10*3/uL (ref 0.0–0.5)
EOS%: 3.1 % (ref 0.0–7.0)
HCT: 43.3 % (ref 38.4–49.9)
HEMOGLOBIN: 14.3 g/dL (ref 13.0–17.1)
LYMPH%: 21.4 % (ref 14.0–49.0)
MCH: 29.6 pg (ref 27.2–33.4)
MCHC: 33 g/dL (ref 32.0–36.0)
MCV: 89.6 fL (ref 79.3–98.0)
MONO#: 0.6 10*3/uL (ref 0.1–0.9)
MONO%: 10.4 % (ref 0.0–14.0)
NEUT%: 64.6 % (ref 39.0–75.0)
NEUTROS ABS: 3.6 10*3/uL (ref 1.5–6.5)
PLATELETS: 148 10*3/uL (ref 140–400)
RBC: 4.83 10*6/uL (ref 4.20–5.82)
RDW: 15.2 % — ABNORMAL HIGH (ref 11.0–14.6)
WBC: 5.6 10*3/uL (ref 4.0–10.3)
lymph#: 1.2 10*3/uL (ref 0.9–3.3)
nRBC: 0 % (ref 0–0)

## 2014-03-04 LAB — COMPREHENSIVE METABOLIC PANEL (CC13)
ALT: 19 U/L (ref 0–55)
AST: 22 U/L (ref 5–34)
Albumin: 3.7 g/dL (ref 3.5–5.0)
Alkaline Phosphatase: 138 U/L (ref 40–150)
Anion Gap: 8 mEq/L (ref 3–11)
BUN: 16.7 mg/dL (ref 7.0–26.0)
CALCIUM: 10 mg/dL (ref 8.4–10.4)
CHLORIDE: 106 meq/L (ref 98–109)
CO2: 26 meq/L (ref 22–29)
CREATININE: 1 mg/dL (ref 0.7–1.3)
GLUCOSE: 148 mg/dL — AB (ref 70–140)
Potassium: 5.1 mEq/L (ref 3.5–5.1)
Sodium: 141 mEq/L (ref 136–145)
Total Bilirubin: 0.34 mg/dL (ref 0.20–1.20)
Total Protein: 7.1 g/dL (ref 6.4–8.3)

## 2014-03-04 MED ORDER — TECHNETIUM TC 99M MEDRONATE IV KIT
27.0000 | PACK | Freq: Once | INTRAVENOUS | Status: AC | PRN
  Administered 2014-03-04: 27 via INTRAVENOUS

## 2014-03-04 MED ORDER — IOHEXOL 300 MG/ML  SOLN: 50.0000 mL | Freq: Once | INTRAMUSCULAR | Status: DC | PRN

## 2014-03-04 MED ORDER — IOHEXOL 300 MG/ML  SOLN
100.0000 mL | Freq: Once | INTRAMUSCULAR | Status: AC | PRN
  Administered 2014-03-04: 100 mL via INTRAVENOUS

## 2014-03-09 ENCOUNTER — Encounter: Payer: Self-pay | Admitting: Radiation Oncology

## 2014-03-10 ENCOUNTER — Telehealth: Payer: Self-pay | Admitting: Internal Medicine

## 2014-03-10 ENCOUNTER — Encounter: Payer: Self-pay | Admitting: Internal Medicine

## 2014-03-10 ENCOUNTER — Ambulatory Visit
Admission: RE | Admit: 2014-03-10 | Discharge: 2014-03-10 | Disposition: A | Discharge status: Home / Self Care | Payer: Medicaid Other | Source: Ambulatory Visit | Attending: Radiation Oncology | Admitting: Radiation Oncology

## 2014-03-10 ENCOUNTER — Ambulatory Visit (HOSPITAL_BASED_OUTPATIENT_CLINIC_OR_DEPARTMENT_OTHER): Payer: Medicaid Other | Admitting: Internal Medicine

## 2014-03-10 VITALS — BP 128/76 | HR 86 | Temp 97.3°F | Resp 18 | Ht 71.0 in | Wt 156.1 lb

## 2014-03-10 DIAGNOSIS — C7952 Secondary malignant neoplasm of bone marrow: Secondary | ICD-10-CM

## 2014-03-10 DIAGNOSIS — C779 Secondary and unspecified malignant neoplasm of lymph node, unspecified: Secondary | ICD-10-CM

## 2014-03-10 DIAGNOSIS — C3490 Malignant neoplasm of unspecified part of unspecified bronchus or lung: Secondary | ICD-10-CM

## 2014-03-10 DIAGNOSIS — C50919 Malignant neoplasm of unspecified site of unspecified female breast: Secondary | ICD-10-CM

## 2014-03-10 DIAGNOSIS — C7949 Secondary malignant neoplasm of other parts of nervous system: Secondary | ICD-10-CM

## 2014-03-10 DIAGNOSIS — M79609 Pain in unspecified limb: Secondary | ICD-10-CM | POA: Insufficient documentation

## 2014-03-10 DIAGNOSIS — M519 Unspecified thoracic, thoracolumbar and lumbosacral intervertebral disc disorder: Secondary | ICD-10-CM

## 2014-03-10 DIAGNOSIS — Z510 Encounter for antineoplastic radiation therapy: Secondary | ICD-10-CM | POA: Insufficient documentation

## 2014-03-10 DIAGNOSIS — C7951 Secondary malignant neoplasm of bone: Secondary | ICD-10-CM | POA: Insufficient documentation

## 2014-03-10 DIAGNOSIS — C7931 Secondary malignant neoplasm of brain: Secondary | ICD-10-CM

## 2014-03-10 DIAGNOSIS — M25559 Pain in unspecified hip: Secondary | ICD-10-CM

## 2014-03-10 DIAGNOSIS — N289 Disorder of kidney and ureter, unspecified: Secondary | ICD-10-CM

## 2014-03-10 DIAGNOSIS — C3410 Malignant neoplasm of upper lobe, unspecified bronchus or lung: Secondary | ICD-10-CM | POA: Insufficient documentation

## 2014-03-10 NOTE — Progress Notes (Signed)
Radiation Oncology         [phone]) [phone] ________________________________  Name: Jacob Webb MRN: [id number]  Date: 03/11/2014  DOB: [birth year]  Follow-Up Visit Note  CC: No PCP Per Patient  Curt Bears, MD  Diagnosis:   Extensive stage small cell lung cancer   Narrative:  The patient returns today for routine follow-up.  The patient was worked in today as he just saw medical oncology/Dr. Earlie Server. He returns today to review a bone scan. This was completed on 03/04/2014 and this showed some increased uptake within the proximal left femur compatible with bony metastasis. The patient states that he has noticed some emerging pain in the left hip region over the last couple of weeks. This is worse with weightbearing. No other significant areas of pain at this time. The patient also had repeat CT scans of the chest abdomen and pelvis and has discussed this with Dr. Julien Nordmann.                              ALLERGIES:  has No Known Allergies.  Meds: Current Outpatient Prescriptions  Medication Sig Dispense Refill  . HYDROcodone-acetaminophen (NORCO/VICODIN) 5-325 MG per tablet Take 1-2 tablets by mouth every 4 (four) hours as needed.  60 tablet  0  . omeprazole (PRILOSEC) 40 MG capsule Take 40 mg by mouth daily.      . promethazine (PHENERGAN) 25 MG tablet Take 1 tablet (25 mg total) by mouth every 6 (six) hours as needed for nausea.  30 tablet  0   No current facility-administered medications for this encounter.    Physical Findings: The patient is in no acute distress. Patient is alert and oriented.  vitals were not taken for this visit..     Lab Findings: Lab Results  Component Value Date   WBC 5.6 03/04/2014   HGB 14.3 03/04/2014   HCT 43.3 03/04/2014   MCV 89.6 03/04/2014   PLT 148 03/04/2014     Radiographic Findings: Ct Chest W Contrast  03/04/2014   CLINICAL DATA:  History of extensive stage small-cell lung cancer. Restaging examination.  EXAM: CT CHEST, ABDOMEN, AND  PELVIS WITH CONTRAST  TECHNIQUE: Multidetector CT imaging of the chest, abdomen and pelvis was performed following the standard protocol during bolus administration of intravenous contrast.  CONTRAST:  180mL OMNIPAQUE IOHEXOL 300 MG/ML  SOLN  COMPARISON:  Multiple priors, most recently 11/30/2013.  FINDINGS: CT CHEST FINDINGS  Mediastinum: Heart size is normal. There is no significant pericardial fluid, thickening or pericardial calcification. Some amorphous soft tissues again noted in the left hilar region and a around the left mainstem bronchus, without definite discrete lymphadenopathy. No clearly definable pathologically enlarged mediastinal or hilar lymph nodes are identified on today's examination. Esophagus is unremarkable in appearance.  Lungs/Pleura: Continued decrease in size of left upper lobe pulmonary nodule (image 16 of series 5), which currently measures 1 x 1.5 cm. No new suspicious appearing pulmonary nodules or masses are identified. No acute consolidative airspace disease. No pleural effusions. Mild diffuse bronchial wall thickening with moderate centrilobular and paraseptal emphysema, most pronounced throughout the lung apices. Extensive bilateral apical pleuroparenchymal thickening and architectural distortion, most compatible with chronic post infectious or inflammatory scarring. Resolution of previously noted right lower lobe bronchial wall thickening and peribronchovascular tree-in-bud micronodularity.  Musculoskeletal: There are no aggressive appearing lytic or blastic lesions noted in the visualized portions of the skeleton.  CT ABDOMEN AND PELVIS FINDINGS  Abdomen/Pelvis: Again  noted is a soft tissue mass adjacent to the lateral aspect of the upper pole of the right kidney which currently measures 2.1 x 1.8 cm (image 69 of series 2), which appears similar in size but is slightly more irregular in shape when compared to prior study 11/30/2013. On coronal images this appears more intimately  associated with the undersurface of the right lobe of the liver. No definite direct hepatic invasion is identified at this time.  The liver, gallbladder, pancreas, spleen, bilateral adrenal glands and bilateral kidneys is unremarkable. Posterior to the right adrenal gland there is a 8 x 6 mm soft tissue nodule (image 62 of series 2), which is new compared to prior studies, concerning for a small metastatic lymph node given the proximity to the presumed right pararenal metastasis. Multiple prominent borderline enlarged retroperitoneal lymph nodes are noted, including 9 mm left para-aortic and 9 mm right paracaval lymph nodes on images 75 of series 2 and 81 of series 2 respectively.  No significant volume of ascites. No pneumoperitoneum. No pathologic distention of small bowel. Normal appendix. Atherosclerosis throughout the abdominal and pelvic vasculature, without evidence of aneurysm or dissection. Calcifications in the prostate gland (nonspecific). Urinary bladder is unremarkable in appearance.  Musculoskeletal: There are no aggressive appearing lytic or blastic lesions noted in the visualized portions of the skeleton. 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  IMPRESSION: 1. Today's study again demonstrates a mixed response to therapy. There is continued decrease in size of the left upper lobe pulmonary nodule, however, the right retroperitoneal Renal lesion noted on the prior study is very similar in size, and there is now a new 8 x 6 mm soft tissue nodule posterior to the right adrenal gland, which is favored to represent a malignant lymph node. Continued attention on future followup studies is recommended. 2. Other borderline enlarged retroperitoneal lymph nodes are conspicuous in number rather than size but are nonspecific. 3. Additional incidental findings, as above, similar prior studies.   Electronically Signed   By: Vinnie Langton M.D.   On: 03/04/2014 14:47   Nm Bone  Scan Whole Body  03/04/2014   CLINICAL DATA:  Small cell lung cancer  EXAM: NUCLEAR MEDICINE WHOLE BODY BONE SCAN  TECHNIQUE: Whole body anterior and posterior images were obtained approximately 3 hours after intravenous injection of radiopharmaceutical.  RADIOPHARMACEUTICALS:  27 mCi of technetium 32 M MDP  COMPARISON:  CT from today  FINDINGS: There is a large focus of moderate increased uptake localizing to the proximal left femur. No additional abnormal areas of increased uptake identified within the axial or appendicular skeleton. Normal physiologic tracer activity is seen within the kidneys an urinary bladder.  IMPRESSION: 1. Increased uptake within the proximal left femur compatible with bone metastasis.   Electronically Signed   By: Kerby Moors M.D.   On: 03/04/2014 14:42   Ct Abdomen Pelvis W Contrast  03/04/2014   CLINICAL DATA:  History of extensive stage small-cell lung cancer. Restaging examination.  EXAM: CT CHEST, ABDOMEN, AND PELVIS WITH CONTRAST  TECHNIQUE: Multidetector CT imaging of the chest, abdomen and pelvis was performed following the standard protocol during bolus administration of intravenous contrast.  CONTRAST:  163mL OMNIPAQUE IOHEXOL 300 MG/ML  SOLN  COMPARISON:  Multiple priors, most recently 11/30/2013.  FINDINGS: CT CHEST FINDINGS  Mediastinum: Heart size is normal. There is no significant pericardial fluid, thickening or pericardial calcification. Some amorphous soft tissues again noted in the left hilar  region and a around the left mainstem bronchus, without definite discrete lymphadenopathy. No clearly definable pathologically enlarged mediastinal or hilar lymph nodes are identified on today's examination. Esophagus is unremarkable in appearance.  Lungs/Pleura: Continued decrease in size of left upper lobe pulmonary nodule (image 16 of series 5), which currently measures 1 x 1.5 cm. No new suspicious appearing pulmonary nodules or masses are identified. No acute  consolidative airspace disease. No pleural effusions. Mild diffuse bronchial wall thickening with moderate centrilobular and paraseptal emphysema, most pronounced throughout the lung apices. Extensive bilateral apical pleuroparenchymal thickening and architectural distortion, most compatible with chronic post infectious or inflammatory scarring. Resolution of previously noted right lower lobe bronchial wall thickening and peribronchovascular tree-in-bud micronodularity.  Musculoskeletal: There are no aggressive appearing lytic or blastic lesions noted in the visualized portions of the skeleton.  CT ABDOMEN AND PELVIS FINDINGS  Abdomen/Pelvis: Again noted is a soft tissue mass adjacent to the lateral aspect of the upper pole of the right kidney which currently measures 2.1 x 1.8 cm (image 69 of series 2), which appears similar in size but is slightly more irregular in shape when compared to prior study 11/30/2013. On coronal images this appears more intimately associated with the undersurface of the right lobe of the liver. No definite direct hepatic invasion is identified at this time.  The liver, gallbladder, pancreas, spleen, bilateral adrenal glands and bilateral kidneys is unremarkable. Posterior to the right adrenal gland there is a 8 x 6 mm soft tissue nodule (image 62 of series 2), which is new compared to prior studies, concerning for a small metastatic lymph node given the proximity to the presumed right pararenal metastasis. Multiple prominent borderline enlarged retroperitoneal lymph nodes are noted, including 9 mm left para-aortic and 9 mm right paracaval lymph nodes on images 75 of series 2 and 81 of series 2 respectively.  No significant volume of ascites. No pneumoperitoneum. No pathologic distention of small bowel. Normal appendix. Atherosclerosis throughout the abdominal and pelvic vasculature, without evidence of aneurysm or dissection. Calcifications in the prostate gland (nonspecific). Urinary  bladder is unremarkable in appearance.  Musculoskeletal: There are no aggressive appearing lytic or blastic lesions noted in the visualized portions of the skeleton. 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  IMPRESSION: 1. Today's study again demonstrates a mixed response to therapy. There is continued decrease in size of the left upper lobe pulmonary nodule, however, the right retroperitoneal Renal lesion noted on the prior study is very similar in size, and there is now a new 8 x 6 mm soft tissue nodule posterior to the right adrenal gland, which is favored to represent a malignant lymph node. Continued attention on future followup studies is recommended. 2. Other borderline enlarged retroperitoneal lymph nodes are conspicuous in number rather than size but are nonspecific. 3. Additional incidental findings, as above, similar prior studies.   Electronically Signed   By: Vinnie Langton M.D.   On: 03/04/2014 14:47    Impression:    The patient does have increased area of activity/metastasis within the left proximal femur. This is associated with some discomfort according to the patient. I believe that this would be a reasonable site for a short course of palliative radiation treatment. I reviewed personally his additional CT scan imaging. Other areas of possible concern remain quite small and include in a soft tissue nodule posterior to the right adrenal gland as well as some retroperitoneal lymph nodes. I discussed with the  patient possible options regarding this and would favor following these areas for now.   Plan:  The patient will be scheduled for a simulation such that we can proceed with treatment planning to the left proximal femur. I anticipate treating the patient to a dose of 20 gray in 5 fractions.   Jodelle Gross, M.D., Ph.D.

## 2014-03-10 NOTE — Telephone Encounter (Signed)
gv adn printed appt sched and avs for pt for May

## 2014-03-10 NOTE — Progress Notes (Signed)
[facility] [phone]   [phone]  OFFICE PROGRESS NOTE  No PCP Per Patient No address on file  DIAGNOSIS AND STAGE: Extensive stage small cell lung cancer diagnosed in May of 2014   PRIOR THERAPY:  1) whole Brain irradiation as well as palliative radiotherapy to the chest mass.  2) Systemic chemotherapy with carboplatin for AUC of 5 on day 1 and etoposide 120 mg/M2 on days 1, 2 and 3 with Neulasta support on day 4, status post 5 cycles. First cycle was started on 05/25/2013.  3) palliative radiotherapy to the abdomen metastasis adjacent to the right kidney under the care of Dr. Lisbeth Renshaw completed on 01/26/2014.  CURRENT THERAPY: observation.  CHEMOTHERAPY INTENT: Palliative  CURRENT # OF CHEMOTHERAPY CYCLES: 0 CURRENT ANTIEMETICS: Zofran, dexamethasone and Compazine  CURRENT SMOKING STATUS: Current smoker and strongly advised to quit smoking and offered smoke cessation program  ORAL CHEMOTHERAPY AND CONSENT: None  CURRENT BISPHOSPHONATES USE: None  PAIN MANAGEMENT: Well-managed with Percocet when necessary  NARCOTICS INDUCED CONSTIPATION: No constipation but has a stool softener at home  LIVING WILL AND CODE STATUS: Discussed with the patient and he is still Full code.   INTERVAL HISTORY: Jacob Webb 63 y.o. male returns to the clinic today for followup visit. The patient is feeling fine today with no specific complaints except for fatigue. He tolerated the previous course of palliative radiotherapy to the abdomen metastases fairly well. He denied having any significant chest pain but continues to have shortness of breath with exertion with no cough or hemoptysis. He denied having any fever or chills. He has some pain on the left hip area with radiation to the left knee. The patient denied having any significant weight loss or night sweats.he has been observation for the last 3 months. The patient had repeat CT scan of the chest, abdomen and  pelvis as well as bone scan performed recently and he is here for evaluation and discussion of his scan results.  MEDICAL HISTORY: Past Medical History  Diagnosis Date  . Cancer     lung ca dx'd 04/2013  . Cancer 11/30/13 ct    rt para renal mass metastatic  . History of radiation therapy 01/07/14-01/26/14    abdomen/30 Gy/56fx    ALLERGIES:  has No Known Allergies.  MEDICATIONS:  Current Outpatient Prescriptions  Medication Sig Dispense Refill  . HYDROcodone-acetaminophen (NORCO/VICODIN) 5-325 MG per tablet Take 1-2 tablets by mouth every 4 (four) hours as needed.  60 tablet  0  . omeprazole (PRILOSEC) 40 MG capsule Take 40 mg by mouth daily.      . promethazine (PHENERGAN) 25 MG tablet Take 1 tablet (25 mg total) by mouth every 6 (six) hours as needed for nausea.  30 tablet  0   No current facility-administered medications for this visit.   REVIEW OF SYSTEMS:  Constitutional: positive for fatigue Eyes: negative Ears, nose, mouth, throat, and face: negative Respiratory: positive for dyspnea on exertion Cardiovascular: negative Gastrointestinal: negative Genitourinary:negative Integument/breast: negative Hematologic/lymphatic: negative Musculoskeletal:negative Neurological: negative Behavioral/Psych: negative Endocrine: negative Allergic/Immunologic: negative   PHYSICAL EXAMINATION: General appearance: alert, cooperative, fatigued and no distress Head: Normocephalic, without obvious abnormality, atraumatic Neck: no adenopathy Lymph nodes: Cervical, supraclavicular, and axillary nodes normal. Resp: clear to auscultation bilaterally Back: symmetric, no curvature. ROM normal. No CVA tenderness. Cardio: regular rate and rhythm, S1, S2 normal, no murmur, click, rub or gallop GI: soft, non-tender; bowel sounds normal; no masses,  no organomegaly Extremities:  extremities normal, atraumatic, no cyanosis or edema Neurologic: Alert and oriented X 3, normal strength and tone. Normal  symmetric reflexes. Normal coordination and gait  ECOG PERFORMANCE STATUS: 1 - Symptomatic but completely ambulatory  Blood pressure 128/76, pulse 86, temperature 97.3 F (36.3 C), temperature source Oral, resp. rate 18, height 5\' 11"  (1.803 m), weight 156 lb 1.6 oz (70.806 kg), SpO2 100.00%.  LABORATORY DATA: Lab Results  Component Value Date   WBC 5.6 03/04/2014   HGB 14.3 03/04/2014   HCT 43.3 03/04/2014   MCV 89.6 03/04/2014   PLT 148 03/04/2014      Chemistry      Component Value Date/Time   NA 141 03/04/2014 1013   NA 138 09/13/2013 0815   K 5.1 03/04/2014 1013   K 3.7 09/13/2013 0815   CL 104 09/13/2013 0815   CL 105 06/08/2013 1009   CO2 26 03/04/2014 1013   CO2 26 09/13/2013 0815   BUN 16.7 03/04/2014 1013   BUN 9 09/13/2013 0815   CREATININE 1.0 03/04/2014 1013   CREATININE 0.82 09/13/2013 0815      Component Value Date/Time   CALCIUM 10.0 03/04/2014 1013   CALCIUM 8.8 09/13/2013 0815   ALKPHOS 138 03/04/2014 1013   ALKPHOS 109 09/11/2013 1335   AST 22 03/04/2014 1013   AST 14 09/11/2013 1335   ALT 19 03/04/2014 1013   ALT 16 09/11/2013 1335   BILITOT 0.34 03/04/2014 1013   BILITOT 0.6 09/11/2013 1335       RADIOGRAPHIC STUDIES: Ct Chest W Contrast  03/04/2014   CLINICAL DATA:  History of extensive stage small-cell lung cancer. Restaging examination.  EXAM: CT CHEST, ABDOMEN, AND PELVIS WITH CONTRAST  TECHNIQUE: Multidetector CT imaging of the chest, abdomen and pelvis was performed following the standard protocol during bolus administration of intravenous contrast.  CONTRAST:  162mL OMNIPAQUE IOHEXOL 300 MG/ML  SOLN  COMPARISON:  Multiple priors, most recently 11/30/2013.  FINDINGS:   CT CHEST FINDINGS  Mediastinum: Heart size is normal. There is no significant pericardial fluid, thickening or pericardial calcification. Some amorphous soft tissues again noted in the left hilar region and a around the left mainstem bronchus, without definite discrete lymphadenopathy. No clearly  definable pathologically enlarged mediastinal or hilar lymph nodes are identified on today's examination. Esophagus is unremarkable in appearance.  Lungs/Pleura: Continued decrease in size of left upper lobe pulmonary nodule (image 16 of series 5), which currently measures 1 x 1.5 cm. No new suspicious appearing pulmonary nodules or masses are identified. No acute consolidative airspace disease. No pleural effusions. Mild diffuse bronchial wall thickening with moderate centrilobular and paraseptal emphysema, most pronounced throughout the lung apices. Extensive bilateral apical pleuroparenchymal thickening and architectural distortion, most compatible with chronic post infectious or inflammatory scarring. Resolution of previously noted right lower lobe bronchial wall thickening and peribronchovascular tree-in-bud micronodularity.  Musculoskeletal: There are no aggressive appearing lytic or blastic lesions noted in the visualized portions of the skeleton.    CT ABDOMEN AND PELVIS FINDINGS  Abdomen/Pelvis: Again noted is a soft tissue mass adjacent to the lateral aspect of the upper pole of the right kidney which currently measures 2.1 x 1.8 cm (image 69 of series 2), which appears similar in size but is slightly more irregular in shape when compared to prior study 11/30/2013. On coronal images this appears more intimately associated with the undersurface of the right lobe of the liver. No definite direct hepatic invasion is identified at this time.  The liver, gallbladder, pancreas,  spleen, bilateral adrenal glands and bilateral kidneys is unremarkable. Posterior to the right adrenal gland there is a 8 x 6 mm soft tissue nodule (image 62 of series 2), which is new compared to prior studies, concerning for a small metastatic lymph node given the proximity to the presumed right pararenal metastasis. Multiple prominent borderline enlarged retroperitoneal lymph nodes are noted, including 9 mm left para-aortic and 9 mm  right paracaval lymph nodes on images 75 of series 2 and 81 of series 2 respectively.  No significant volume of ascites. No pneumoperitoneum. No pathologic distention of small bowel. Normal appendix. Atherosclerosis throughout the abdominal and pelvic vasculature, without evidence of aneurysm or dissection. Calcifications in the prostate gland (nonspecific). Urinary bladder is unremarkable in appearance.  Musculoskeletal: There are no aggressive appearing lytic or blastic lesions noted in the visualized portions of the skeleton. 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    IMPRESSION: 1. Today's study again demonstrates a mixed response to therapy. There is continued decrease in size of the left upper lobe pulmonary nodule, however, the right retroperitoneal Renal lesion noted on the prior study is very similar in size, and there is now a new 8 x 6 mm soft tissue nodule posterior to the right adrenal gland, which is favored to represent a malignant lymph node. Continued attention on future followup studies is recommended. 2. Other borderline enlarged retroperitoneal lymph nodes are conspicuous in number rather than size but are nonspecific. 3. Additional incidental findings, as above, similar prior studies.   Electronically Signed   By: Vinnie Langton M.D.   On: 03/04/2014 14:47   Nm Bone Scan Whole Body  03/04/2014   CLINICAL DATA:  Small cell lung cancer  EXAM: NUCLEAR MEDICINE WHOLE BODY BONE SCAN  TECHNIQUE: Whole body anterior and posterior images were obtained approximately 3 hours after intravenous injection of radiopharmaceutical.  RADIOPHARMACEUTICALS:  27 mCi of technetium 66 M MDP  COMPARISON:  CT from today  FINDINGS: There is a large focus of moderate increased uptake localizing to the proximal left femur. No additional abnormal areas of increased uptake identified within the axial or appendicular skeleton. Normal physiologic tracer activity is seen within the  kidneys an urinary bladder.  IMPRESSION: 1. Increased uptake within the proximal left femur compatible with bone metastasis.   Electronically Signed   By: Kerby Moors M.D.   On: 03/04/2014 14:42   ASSESSMENT AND PLAN:  This is a very pleasant 63 years old white male with extensive stage small cell lung cancer: he completed 5 cycles of systemic chemotherapy with carboplatin and etoposide with significant improvement in his disease,  He recently received palliative radiotherapy to enlarging right para-renal mass under the care of Dr. Lisbeth Renshaw. His recent CT scan showed stable disease except for new soft tissue nodule posterior to the right adrenal gland questionable for malignant lymph node. His bone scan showed increased uptake within the proximal left femur compatible with bone metastasis and the patient has pain in this area. I would referred the patient to Dr. Lisbeth Renshaw for consideration of palliative radiotherapy to the left hip as well as the new soft tissue nodule.  I also discussed with the patient consideration of second line chemotherapy versus observation for now. He would like to continue on observation. I would see the patient back for follow up visit in 2 months with repeat CT scan of the chest, abdomen and pelvis for reevaluation of his disease. If the scan showed  any further evidence for disease progression, I would consider the patient for repeat systemic chemotherapy. The patient was advised to call immediately if has any concerning symptoms in the interval.  The patient voices understanding of current disease status and treatment options and is in agreement with the current care plan.  All questions were answered. The patient knows to call the clinic with any problems, questions or concerns. We can certainly see the patient much sooner if necessary.  Disclaimer: This note was dictated with voice recognition software. Similar sounding words can inadvertently be transcribed and may not  be corrected upon review.

## 2014-03-10 NOTE — Progress Notes (Signed)
Patient showed up in lobby asking to see Dr.Moody today instead of tomorrow transportation issues, he had just left Dr.mohameds office visit, Okay per Dr.Moody, Bone scan results in, printed Dr.Mohamed's note today to give to Dr.Moody, and patients vitals and meds updated in med ical oncology 1:28 PM

## 2014-03-11 ENCOUNTER — Ambulatory Visit: Admission: RE | Admit: 2014-03-11 | Payer: Medicaid Other | Source: Ambulatory Visit | Admitting: Radiation Oncology

## 2014-03-11 HISTORY — DX: Personal history of irradiation: Z92.3

## 2014-03-24 ENCOUNTER — Ambulatory Visit: Payer: Medicaid Other | Admitting: Radiation Oncology

## 2014-03-26 ENCOUNTER — Ambulatory Visit
Admission: RE | Admit: 2014-03-26 | Discharge: 2014-03-26 | Disposition: A | Discharge status: Home / Self Care | Payer: Medicaid Other | Source: Ambulatory Visit | Attending: Radiation Oncology | Admitting: Radiation Oncology

## 2014-03-26 DIAGNOSIS — C7951 Secondary malignant neoplasm of bone: Secondary | ICD-10-CM | POA: Insufficient documentation

## 2014-03-26 DIAGNOSIS — C7952 Secondary malignant neoplasm of bone marrow: Principal | ICD-10-CM

## 2014-03-26 MED ORDER — HYDROCODONE-ACETAMINOPHEN 5-325 MG PO TABS: 1.0000 | ORAL_TABLET | ORAL | Status: DC | PRN

## 2014-03-26 NOTE — Progress Notes (Signed)
  Radiation Oncology         [phone] ________________________________  Name: Jacob Webb MRN: [id number]  Date: 03/26/2014  DOB: 08/02/1951  SIMULATION AND TREATMENT PLANNING NOTE  DIAGNOSIS:  Metastatic lung cancer  Site:  Left femur, proximal  NARRATIVE:  The patient was brought to the [facility].  Identity was confirmed.  All relevant records and images related to the planned course of therapy were reviewed.   Written consent to proceed with treatment was confirmed which was freely given after reviewing the details related to the planned course of therapy had been reviewed with the patient.  Then, the patient was set-up in a stable reproducible  supine position for radiation therapy.  CT images were obtained.  Surface markings were placed.    Medically necessary complex treatment device(s) for immobilization:  Customized VAC lock bag.   The CT images were loaded into the planning software.  Then the target and avoidance structures were contoured.  Treatment planning then occurred.  The radiation prescription was entered and confirmed.  A total of 2 complex treatment devices were fabricated which relate to the designed radiation treatment fields. Each of these customized fields/ complex treatment devices will be used on a daily basis during the radiation course. I have requested : Isodose Plan.   PLAN:  The patient will receive 20 Gy in 5 fractions.  ________________________________   Jodelle Gross, MD, PhD

## 2014-03-31 ENCOUNTER — Other Ambulatory Visit: Payer: Medicaid Other

## 2014-04-05 ENCOUNTER — Encounter: Payer: Self-pay | Admitting: Radiation Oncology

## 2014-04-05 ENCOUNTER — Ambulatory Visit
Admission: RE | Admit: 2014-04-05 | Discharge: 2014-04-05 | Disposition: A | Discharge status: Home / Self Care | Payer: Medicaid Other | Source: Ambulatory Visit | Attending: Radiation Oncology | Admitting: Radiation Oncology

## 2014-04-05 NOTE — Progress Notes (Signed)
Simulation verification note: The patient underwent simulation verification for treatment to his left proximal femur. His isocenter is in good position and the multileaf collimators contoured the treatment volume appropriately.

## 2014-04-06 ENCOUNTER — Ambulatory Visit
Admission: RE | Admit: 2014-04-06 | Discharge: 2014-04-06 | Disposition: A | Discharge status: Home / Self Care | Payer: Medicaid Other | Source: Ambulatory Visit | Attending: Radiation Oncology | Admitting: Radiation Oncology

## 2014-04-07 ENCOUNTER — Ambulatory Visit: Payer: Medicaid Other | Admitting: Internal Medicine

## 2014-04-07 ENCOUNTER — Ambulatory Visit
Admission: RE | Admit: 2014-04-07 | Discharge: 2014-04-07 | Disposition: A | Discharge status: Home / Self Care | Payer: Medicaid Other | Source: Ambulatory Visit | Attending: Radiation Oncology | Admitting: Radiation Oncology

## 2014-04-08 ENCOUNTER — Ambulatory Visit
Admission: RE | Admit: 2014-04-08 | Discharge: 2014-04-08 | Disposition: A | Discharge status: Home / Self Care | Payer: Medicaid Other | Source: Ambulatory Visit | Attending: Radiation Oncology | Admitting: Radiation Oncology

## 2014-04-09 ENCOUNTER — Encounter: Payer: Self-pay | Admitting: Radiation Oncology

## 2014-04-09 ENCOUNTER — Ambulatory Visit
Admission: RE | Admit: 2014-04-09 | Discharge: 2014-04-09 | Disposition: A | Discharge status: Home / Self Care | Payer: Medicaid Other | Source: Ambulatory Visit | Attending: Radiation Oncology | Admitting: Radiation Oncology

## 2014-04-09 VITALS — BP 101/70 | HR 76 | Resp 16 | Wt 153.1 lb

## 2014-04-09 DIAGNOSIS — C772 Secondary and unspecified malignant neoplasm of intra-abdominal lymph nodes: Secondary | ICD-10-CM

## 2014-04-09 NOTE — Progress Notes (Signed)
  Department of Radiation Oncology  Phone:  [phone] Fax:        [phone]  Weekly Treatment Note    Name: Jacob Webb Date: 04/09/2014 MRN: [id number] DOB: 1951-10-22   Current dose: 20 Gy  Current fraction: 5   MEDICATIONS: Current Outpatient Prescriptions  Medication Sig Dispense Refill  . HYDROcodone-acetaminophen (NORCO/VICODIN) 5-325 MG per tablet Take 1-2 tablets by mouth every 4 (four) hours as needed.  60 tablet  0  . omeprazole (PRILOSEC) 40 MG capsule Take 40 mg by mouth daily.      . promethazine (PHENERGAN) 25 MG tablet Take 1 tablet (25 mg total) by mouth every 6 (six) hours as needed for nausea.  30 tablet  0   No current facility-administered medications for this encounter.     ALLERGIES: Review of patient's allergies indicates no known allergies.   LABORATORY DATA:  Lab Results  Component Value Date   WBC 5.6 03/04/2014   HGB 14.3 03/04/2014   HCT 43.3 03/04/2014   MCV 89.6 03/04/2014   PLT 148 03/04/2014   Lab Results  Component Value Date   NA 141 03/04/2014   K 5.1 03/04/2014   CL 104 09/13/2013   CO2 26 03/04/2014   Lab Results  Component Value Date   ALT 19 03/04/2014   AST 22 03/04/2014   ALKPHOS 138 03/04/2014   BILITOT 0.34 03/04/2014     NARRATIVE: Jacob Webb was seen today for weekly treatment management. The chart was checked and the patient's films were reviewed. The patient did well with treatment. He finished his final fraction today. No acute toxicity. Still some pain in the left leg region. He states to me today that it is "not bad bad." Hopefully this will improve in the next couple of weeks.  PHYSICAL EXAMINATION: weight is 153 lb 1.6 oz (69.446 kg). His blood pressure is 101/70 and his pulse is 76. His respiration is 16.        ASSESSMENT: The patient did satisfactorily with treatment.  PLAN: The patient will follow-up in our clinic in 1 month.

## 2014-04-09 NOTE — Progress Notes (Signed)
Patient reports left leg pain 7 on a scale of 0-10. Denies taking vicodin as directed. Steady gait noted. Reports "a little" numbness in his left leg. No edema of bilateral extremities noted. Completed treatment today understanding to arrange a one month follow up prior to leaving the facility today. Denies skin changes within treatment field.

## 2014-04-14 ENCOUNTER — Telehealth: Payer: Self-pay | Admitting: Internal Medicine

## 2014-04-14 NOTE — Telephone Encounter (Signed)
Jacob Webb and advised on May MD visit d.t. change due to MD out of the office.Marland KitchenMarland KitchenMarland KitchenMarland Kitchenpt ok and aware

## 2014-04-22 NOTE — Progress Notes (Signed)
  Radiation Oncology         [phone] ________________________________  Name: Jacob Webb MRN: [id number]  Date: 04/09/2014  DOB: 1951/07/25  End of Treatment Note  Diagnosis:   Metastatic small cell lung cancer     Indication for treatment:  Palliative       Radiation treatment dates:   04/05/2014 through 04/09/2014  Site/dose:   The patient was treated to the left femur to a dose of 20 gray in 5 fractions. The patient was treated with AP and PA fields.  Narrative: The patient tolerated radiation treatment relatively well.   The patient did not exhibit any acute toxicity during treatment. No significant skin reaction or GI acute issues.  Plan: The patient has completed radiation treatment. The patient will return to radiation oncology clinic for routine followup in one month. I advised the patient to call or return sooner if they have any questions or concerns related to their recovery or treatment. ________________________________  Jodelle Gross, M.D., Ph.D.

## 2014-04-22 NOTE — Progress Notes (Signed)
  Radiation Oncology         [phone] ________________________________  Name: Jacob Webb MRN: [id number]  Date: 04/05/2014  DOB: [birth year]  Simulation Verification Note   NARRATIVE: The patient was brought to the treatment unit and placed in the planned treatment position. The clinical setup was verified. Then port films were obtained and uploaded to the radiation oncology medical record software.  The treatment beams were carefully compared against the planned radiation fields. The position, location, and shape of the radiation fields was reviewed. The targeted volume of tissue appears to be appropriately covered by the radiation beams. Based on my personal review, I approved the simulation verification. The patient's treatment will proceed as planned.  ________________________________   Jodelle Gross, MD, PhD

## 2014-04-28 ENCOUNTER — Telehealth: Payer: Self-pay | Admitting: Medical Oncology

## 2014-04-28 NOTE — Telephone Encounter (Signed)
Lab  appt order. sent

## 2014-04-29 ENCOUNTER — Other Ambulatory Visit: Payer: Medicaid Other

## 2014-04-29 ENCOUNTER — Ambulatory Visit (HOSPITAL_COMMUNITY): Payer: Medicaid Other

## 2014-04-29 ENCOUNTER — Telehealth: Payer: Self-pay | Admitting: Internal Medicine

## 2014-04-29 NOTE — Telephone Encounter (Signed)
no vm available....mailed pt appt sched and letter °

## 2014-05-05 ENCOUNTER — Other Ambulatory Visit: Payer: Self-pay | Admitting: Internal Medicine

## 2014-05-05 ENCOUNTER — Ambulatory Visit (HOSPITAL_COMMUNITY)
Admission: RE | Admit: 2014-05-05 | Discharge: 2014-05-05 | Disposition: A | Discharge status: Home / Self Care | Payer: Medicaid Other | Source: Ambulatory Visit | Attending: Internal Medicine | Admitting: Internal Medicine

## 2014-05-05 ENCOUNTER — Encounter: Payer: Self-pay | Admitting: *Deleted

## 2014-05-05 ENCOUNTER — Encounter (HOSPITAL_COMMUNITY): Payer: Self-pay

## 2014-05-05 ENCOUNTER — Other Ambulatory Visit (HOSPITAL_BASED_OUTPATIENT_CLINIC_OR_DEPARTMENT_OTHER): Payer: Medicaid Other

## 2014-05-05 DIAGNOSIS — Z923 Personal history of irradiation: Secondary | ICD-10-CM | POA: Diagnosis not present

## 2014-05-05 DIAGNOSIS — Z9221 Personal history of antineoplastic chemotherapy: Secondary | ICD-10-CM | POA: Diagnosis not present

## 2014-05-05 DIAGNOSIS — J984 Other disorders of lung: Secondary | ICD-10-CM | POA: Diagnosis not present

## 2014-05-05 DIAGNOSIS — C3490 Malignant neoplasm of unspecified part of unspecified bronchus or lung: Secondary | ICD-10-CM

## 2014-05-05 DIAGNOSIS — R109 Unspecified abdominal pain: Secondary | ICD-10-CM | POA: Insufficient documentation

## 2014-05-05 DIAGNOSIS — I700 Atherosclerosis of aorta: Secondary | ICD-10-CM | POA: Diagnosis not present

## 2014-05-05 DIAGNOSIS — C3410 Malignant neoplasm of upper lobe, unspecified bronchus or lung: Secondary | ICD-10-CM

## 2014-05-05 DIAGNOSIS — K769 Liver disease, unspecified: Secondary | ICD-10-CM | POA: Diagnosis not present

## 2014-05-05 DIAGNOSIS — J438 Other emphysema: Secondary | ICD-10-CM | POA: Insufficient documentation

## 2014-05-05 LAB — CBC WITH DIFFERENTIAL/PLATELET
BASO%: 0.3 % (ref 0.0–2.0)
Basophils Absolute: 0 10*3/uL (ref 0.0–0.1)
EOS%: 2.1 % (ref 0.0–7.0)
Eosinophils Absolute: 0.1 10*3/uL (ref 0.0–0.5)
HCT: 38.1 % — ABNORMAL LOW (ref 38.4–49.9)
HGB: 12.7 g/dL — ABNORMAL LOW (ref 13.0–17.1)
LYMPH%: 16.2 % (ref 14.0–49.0)
MCH: 30 pg (ref 27.2–33.4)
MCHC: 33.3 g/dL (ref 32.0–36.0)
MCV: 89.9 fL (ref 79.3–98.0)
MONO#: 0.7 10*3/uL (ref 0.1–0.9)
MONO%: 10.8 % (ref 0.0–14.0)
NEUT#: 4.4 10*3/uL (ref 1.5–6.5)
NEUT%: 70.6 % (ref 39.0–75.0)
Platelets: 170 10*3/uL (ref 140–400)
RBC: 4.24 10*6/uL (ref 4.20–5.82)
RDW: 14.3 % (ref 11.0–14.6)
WBC: 6.3 10*3/uL (ref 4.0–10.3)
lymph#: 1 10*3/uL (ref 0.9–3.3)

## 2014-05-05 LAB — COMPREHENSIVE METABOLIC PANEL (CC13)
ALBUMIN: 3.5 g/dL (ref 3.5–5.0)
ALT: 14 U/L (ref 0–55)
ANION GAP: 9 meq/L (ref 3–11)
AST: 21 U/L (ref 5–34)
Alkaline Phosphatase: 137 U/L (ref 40–150)
BUN: 25.4 mg/dL (ref 7.0–26.0)
CALCIUM: 9.8 mg/dL (ref 8.4–10.4)
CO2: 25 meq/L (ref 22–29)
Chloride: 107 mEq/L (ref 98–109)
Creatinine: 1.4 mg/dL — ABNORMAL HIGH (ref 0.7–1.3)
Glucose: 58 mg/dl — ABNORMAL LOW (ref 70–140)
POTASSIUM: 4.5 meq/L (ref 3.5–5.1)
SODIUM: 142 meq/L (ref 136–145)
TOTAL PROTEIN: 7.2 g/dL (ref 6.4–8.3)
Total Bilirubin: 0.29 mg/dL (ref 0.20–1.20)

## 2014-05-05 MED ORDER — IOHEXOL 300 MG/ML  SOLN
50.0000 mL | Freq: Once | INTRAMUSCULAR | Status: AC | PRN
  Administered 2014-05-05: 50 mL via ORAL

## 2014-05-05 NOTE — Progress Notes (Unsigned)
Patient arrived at nursing requesting rx for hydrocodone and phenergan, said he had called 2-3 days ago requesting this, checked in Epic, no note seen, patient having CT scan at noon, informed him that Dr.moody was in with a consult and would have to check back after Ct scan to see if Md would refill these rx,patient stated he would 11:08 AM

## 2014-05-06 ENCOUNTER — Ambulatory Visit: Payer: Medicaid Other | Admitting: Internal Medicine

## 2014-05-13 ENCOUNTER — Ambulatory Visit (HOSPITAL_BASED_OUTPATIENT_CLINIC_OR_DEPARTMENT_OTHER): Payer: Medicaid Other | Admitting: Internal Medicine

## 2014-05-13 ENCOUNTER — Telehealth: Payer: Self-pay | Admitting: Internal Medicine

## 2014-05-13 ENCOUNTER — Encounter: Payer: Self-pay | Admitting: Internal Medicine

## 2014-05-13 VITALS — BP 114/63 | HR 90 | Temp 98.6°F | Resp 19 | Ht 70.0 in | Wt 149.1 lb

## 2014-05-13 DIAGNOSIS — C786 Secondary malignant neoplasm of retroperitoneum and peritoneum: Secondary | ICD-10-CM

## 2014-05-13 DIAGNOSIS — C3490 Malignant neoplasm of unspecified part of unspecified bronchus or lung: Secondary | ICD-10-CM

## 2014-05-13 NOTE — Progress Notes (Signed)
[facility] [phone]   [phone]  OFFICE PROGRESS NOTE  No PCP Per Patient No address on file  DIAGNOSIS AND STAGE: Extensive stage small cell lung cancer diagnosed in May of 2014   PRIOR THERAPY:  1) whole Brain irradiation as well as palliative radiotherapy to the chest mass.  2) Systemic chemotherapy with carboplatin for AUC of 5 on day 1 and etoposide 120 mg/M2 on days 1, 2 and 3 with Neulasta support on day 4, status post 5 cycles. First cycle was started on 05/25/2013.  3) palliative radiotherapy to the abdomen metastasis adjacent to the right kidney under the care of Dr. Lisbeth Renshaw completed on 01/26/2014. 4) palliative radiotherapy to the left hip under the care of Dr. Lisbeth Renshaw.  CURRENT THERAPY: observation.  CHEMOTHERAPY INTENT: Palliative  CURRENT # OF CHEMOTHERAPY CYCLES: 0 CURRENT ANTIEMETICS: Zofran, dexamethasone and Compazine  CURRENT SMOKING STATUS: Current smoker and strongly advised to quit smoking and offered smoke cessation program  ORAL CHEMOTHERAPY AND CONSENT: None  CURRENT BISPHOSPHONATES USE: None  PAIN MANAGEMENT: Well-managed with Percocet when necessary  NARCOTICS INDUCED CONSTIPATION: No constipation but has a stool softener at home  LIVING WILL AND CODE STATUS: Discussed with the patient and he is still Full code.   INTERVAL HISTORY: Jacob Webb 63 y.o. male returns to the clinic today for followup visit. The patient is feeling fine today with no specific complaints except for fatigue and intermittent abdominal pain. He denied having any significant chest pain but continues to have shortness of breath with exertion with no cough or hemoptysis. He denied having any fever or chills.  The patient denied having any significant weight loss or night sweats.he has been observation for the last 3 months. The patient had repeat CT scan of the chest, abdomen and pelvis as well as bone scan performed recently and he is here  for evaluation and discussion of his scan results.  MEDICAL HISTORY: Past Medical History  Diagnosis Date  . Cancer     lung ca dx'd 04/2013  . Cancer 11/30/13 ct    rt para renal mass metastatic  . History of radiation therapy 01/07/14-01/26/14    abdomen/30 Gy/79fx    ALLERGIES:  has No Known Allergies.  MEDICATIONS:  Current Outpatient Prescriptions  Medication Sig Dispense Refill  . HYDROcodone-acetaminophen (NORCO/VICODIN) 5-325 MG per tablet Take 1-2 tablets by mouth every 4 (four) hours as needed.  60 tablet  0  . omeprazole (PRILOSEC) 40 MG capsule Take 40 mg by mouth daily.      . promethazine (PHENERGAN) 25 MG tablet Take 1 tablet (25 mg total) by mouth every 6 (six) hours as needed for nausea.  30 tablet  0   No current facility-administered medications for this visit.   REVIEW OF SYSTEMS:  Constitutional: positive for fatigue Eyes: negative Ears, nose, mouth, throat, and face: negative Respiratory: positive for dyspnea on exertion Cardiovascular: negative Gastrointestinal: negative Genitourinary:negative Integument/breast: negative Hematologic/lymphatic: negative Musculoskeletal:negative Neurological: negative Behavioral/Psych: negative Endocrine: negative Allergic/Immunologic: negative   PHYSICAL EXAMINATION: General appearance: alert, cooperative, fatigued and no distress Head: Normocephalic, without obvious abnormality, atraumatic Neck: no adenopathy Lymph nodes: Cervical, supraclavicular, and axillary nodes normal. Resp: clear to auscultation bilaterally Back: symmetric, no curvature. ROM normal. No CVA tenderness. Cardio: regular rate and rhythm, S1, S2 normal, no murmur, click, rub or gallop GI: soft, non-tender; bowel sounds normal; no masses,  no organomegaly Extremities: extremities normal, atraumatic, no cyanosis or edema Neurologic: Alert and oriented  X 3, normal strength and tone. Normal symmetric reflexes. Normal coordination and gait  ECOG  PERFORMANCE STATUS: 1 - Symptomatic but completely ambulatory  Blood pressure 114/63, pulse 90, temperature 98.6 F (37 C), temperature source Oral, resp. rate 19, height 5\' 10"  (1.778 m), weight 149 lb 1.6 oz (67.631 kg), SpO2 100.00%.  LABORATORY DATA: Lab Results  Component Value Date   WBC 6.3 05/05/2014   HGB 12.7* 05/05/2014   HCT 38.1* 05/05/2014   MCV 89.9 05/05/2014   PLT 170 05/05/2014      Chemistry      Component Value Date/Time   NA 142 05/05/2014 1048   NA 138 09/13/2013 0815   K 4.5 05/05/2014 1048   K 3.7 09/13/2013 0815   CL 104 09/13/2013 0815   CL 105 06/08/2013 1009   CO2 25 05/05/2014 1048   CO2 26 09/13/2013 0815   BUN 25.4 05/05/2014 1048   BUN 9 09/13/2013 0815   CREATININE 1.4* 05/05/2014 1048   CREATININE 0.82 09/13/2013 0815      Component Value Date/Time   CALCIUM 9.8 05/05/2014 1048   CALCIUM 8.8 09/13/2013 0815   ALKPHOS 137 05/05/2014 1048   ALKPHOS 109 09/11/2013 1335   AST 21 05/05/2014 1048   AST 14 09/11/2013 1335   ALT 14 05/05/2014 1048   ALT 16 09/11/2013 1335   BILITOT 0.29 05/05/2014 1048   BILITOT 0.6 09/11/2013 1335       RADIOGRAPHIC STUDIES:  Ct Chest Wo Contrast  05/05/2014   CLINICAL DATA:  Lung cancer diagnosed in 2014. Restaging. Upper and lower abdominal pain. Chemotherapy and progress. Radiation therapy completed 01/26/2014. Extensive stage small-cell lung cancer. Radiotherapy to the abdomen.  EXAM: CT CHEST, ABDOMEN AND PELVIS WITHOUT CONTRAST  TECHNIQUE: Multidetector CT imaging of the chest, abdomen and pelvis was performed following the standard protocol without IV contrast.  COMPARISON:  03/04/2014  FINDINGS:   CT CHEST FINDINGS  Lungs/Pleura: Lower lobe predominant bronchial wall thickening. Moderate centrilobular emphysema with biapical pleural parenchymal scarring.  Further decrease in soft tissue thickening about the posterior left upper lobe. 1.0 x 1.0 cm today versus 1.5 x 1.0 cm on the prior exam. Image 15 today.  No pleural fluid.   Heart/Mediastinum: No supraclavicular adenopathy. Aortic and branch vessel atherosclerosis. Normal heart size, without pericardial effusion. No well-defined mediastinal or right hilar adenopathy. Soft tissue thickening is identified along the left upper lobe bronchovascular interstitium. This measures 8 mm on image 9 sphenoid versus similar on the prior. Favored to the treatment related.    CT ABDOMEN AND PELVIS FINDINGS  Abdomen/Pelvis: Old granulomatous disease in the liver. Possible capsular based nodule along the inferior right lobe of the liver on image 70. 9 mm. Possible vague hypoattenuation in the right lobe of the liver on image 68 transverse and image 35 coronal.  Normal noncontrast appearance of the spleen, stomach, pancreas, gallbladder, biliary tract, adrenal glands. An 8 mm nodule which is favored to be adjacent to the left kidney on image 67 is slightly more conspicuous than at 6 mm on the prior  Right suprarenal nodule has markedly enlarged, 2.4 x 2.7 cm on image 62 today versus 8 mm on the prior.  A right perirenal nodule measures 2.8 x 1.6 cm on image 68 versus 2.1 x 1.8 cm on the prior exam.  Development of moderate right-sided hydroureteronephrosis. This continues to the level of an enlarged right retroperitoneal (pericaval) 1.7 x 2.7 cm node on image 82/series 2.  Aortic and branch  vessel atherosclerosis. Normal colon and terminal ileum. Normal small bowel without abdominal ascites. No pelvic adenopathy. Normal urinary bladder and prostate. No significant free fluid.  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    IMPRESSION: CT CHEST IMPRESSION  1. Further decrease in size of a left upper lobe pulmonary nodule. No evidence of thoracic adenopathy. 2. Soft tissue fullness about the left suprahilar region, favored to be treatment related. This is similar and warrants followup attention.  CT ABDOMEN AND PELVIS IMPRESSION  1. Progression of retroperitoneal  metastasis. Progressive right and developing left-sided retroperitoneal nodes/nodules. 2. Development of right-sided moderate hydroureteronephrosis secondary to a retroperitoneal nodal metastasis. 3. Decrease sensitivity secondary to lack of intravenous contrast. Cannot exclude right hepatic lobe metastasis. If confirmation is desired, abdominal MRI (preferably with and without contrast) should be considered.   Electronically Signed   By: Abigail Miyamoto M.D.   On: 05/05/2014 16:07   ASSESSMENT AND PLAN:  This is a very pleasant 63 years old white male with extensive stage small cell lung cancer: he completed 5 cycles of systemic chemotherapy with carboplatin and etoposide with significant improvement in his disease.  His recent CT scan showed evidence for disease progression in the retroperitoneum metastasis. I discussed the scan results with the patient today. I recommended for him to resume her systemic chemotherapy with carboplatin and etoposide. The patient had good response to this treatment in the past and his last treatment was almost 9 months ago. He is expected to start the first cycle of this treatment next week. I discussed with her the adverse effect of this treatment including but not limited to alopecia, myelosuppression, nausea and vomiting, peripheral neuropathy, liver or renal dysfunction. He would come back for followup visit in 4 weeks with the start of cycle #2. He was advised to call immediately if he has any concerning symptoms in the interval.  The patient voices understanding of current disease status and treatment options and is in agreement with the current care plan.  All questions were answered. The patient knows to call the clinic with any problems, questions or concerns. We can certainly see the patient much sooner if necessary.  Disclaimer: This note was dictated with voice recognition software. Similar sounding words can inadvertently be transcribed and may not be  corrected upon review.

## 2014-05-13 NOTE — Telephone Encounter (Signed)
gv pt appt schedule for june/july. tx will start 6/2 - no availability 6/1.

## 2014-05-18 ENCOUNTER — Ambulatory Visit (HOSPITAL_BASED_OUTPATIENT_CLINIC_OR_DEPARTMENT_OTHER): Payer: Medicaid Other

## 2014-05-18 ENCOUNTER — Ambulatory Visit: Payer: Medicaid Other

## 2014-05-18 ENCOUNTER — Other Ambulatory Visit (HOSPITAL_BASED_OUTPATIENT_CLINIC_OR_DEPARTMENT_OTHER): Payer: Medicaid Other

## 2014-05-18 ENCOUNTER — Telehealth: Payer: Self-pay | Admitting: Internal Medicine

## 2014-05-18 DIAGNOSIS — C7931 Secondary malignant neoplasm of brain: Secondary | ICD-10-CM

## 2014-05-18 DIAGNOSIS — C7949 Secondary malignant neoplasm of other parts of nervous system: Secondary | ICD-10-CM

## 2014-05-18 DIAGNOSIS — C3490 Malignant neoplasm of unspecified part of unspecified bronchus or lung: Secondary | ICD-10-CM

## 2014-05-18 DIAGNOSIS — C3410 Malignant neoplasm of upper lobe, unspecified bronchus or lung: Secondary | ICD-10-CM

## 2014-05-18 DIAGNOSIS — Z5111 Encounter for antineoplastic chemotherapy: Secondary | ICD-10-CM

## 2014-05-18 LAB — CBC WITH DIFFERENTIAL/PLATELET
BASO%: 0.9 % (ref 0.0–2.0)
BASOS ABS: 0 10*3/uL (ref 0.0–0.1)
EOS ABS: 0.2 10*3/uL (ref 0.0–0.5)
EOS%: 3.8 % (ref 0.0–7.0)
HCT: 36.8 % — ABNORMAL LOW (ref 38.4–49.9)
HEMOGLOBIN: 12.1 g/dL — AB (ref 13.0–17.1)
LYMPH#: 0.8 10*3/uL — AB (ref 0.9–3.3)
LYMPH%: 16.6 % (ref 14.0–49.0)
MCH: 29.4 pg (ref 27.2–33.4)
MCHC: 32.8 g/dL (ref 32.0–36.0)
MCV: 89.8 fL (ref 79.3–98.0)
MONO#: 0.8 10*3/uL (ref 0.1–0.9)
MONO%: 15.6 % — ABNORMAL HIGH (ref 0.0–14.0)
NEUT%: 63.1 % (ref 39.0–75.0)
NEUTROS ABS: 3.1 10*3/uL (ref 1.5–6.5)
Platelets: 175 10*3/uL (ref 140–400)
RBC: 4.1 10*6/uL — ABNORMAL LOW (ref 4.20–5.82)
RDW: 15.2 % — ABNORMAL HIGH (ref 11.0–14.6)
WBC: 4.9 10*3/uL (ref 4.0–10.3)

## 2014-05-18 LAB — COMPREHENSIVE METABOLIC PANEL (CC13)
ALBUMIN: 3.5 g/dL (ref 3.5–5.0)
ALK PHOS: 130 U/L (ref 40–150)
ALT: 10 U/L (ref 0–55)
AST: 23 U/L (ref 5–34)
Anion Gap: 16 mEq/L — ABNORMAL HIGH (ref 3–11)
BUN: 28 mg/dL — AB (ref 7.0–26.0)
CO2: 20 mEq/L — ABNORMAL LOW (ref 22–29)
Calcium: 9.3 mg/dL (ref 8.4–10.4)
Chloride: 105 mEq/L (ref 98–109)
Creatinine: 1.5 mg/dL — ABNORMAL HIGH (ref 0.7–1.3)
Glucose: 85 mg/dl (ref 70–140)
POTASSIUM: 3.5 meq/L (ref 3.5–5.1)
Sodium: 140 mEq/L (ref 136–145)
TOTAL PROTEIN: 6.9 g/dL (ref 6.4–8.3)
Total Bilirubin: 0.43 mg/dL (ref 0.20–1.20)

## 2014-05-18 MED ORDER — ONDANSETRON 16 MG/50ML IVPB (CHCC)
16.0000 mg | Freq: Once | INTRAVENOUS | Status: AC
  Administered 2014-05-18: 16 mg via INTRAVENOUS

## 2014-05-18 MED ORDER — ONDANSETRON 16 MG/50ML IVPB (CHCC)
INTRAVENOUS | Status: AC
  Filled 2014-05-18: qty 16

## 2014-05-18 MED ORDER — SODIUM CHLORIDE 0.9 % IJ SOLN
10.0000 mL | INTRAMUSCULAR | Status: DC | PRN
  Filled 2014-05-18: qty 10

## 2014-05-18 MED ORDER — DEXAMETHASONE SODIUM PHOSPHATE 20 MG/5ML IJ SOLN
20.0000 mg | Freq: Once | INTRAMUSCULAR | Status: AC
  Administered 2014-05-18: 20 mg via INTRAVENOUS

## 2014-05-18 MED ORDER — HEPARIN SOD (PORK) LOCK FLUSH 100 UNIT/ML IV SOLN
500.0000 [IU] | Freq: Once | INTRAVENOUS | Status: DC | PRN
  Filled 2014-05-18: qty 5

## 2014-05-18 MED ORDER — DEXAMETHASONE SODIUM PHOSPHATE 20 MG/5ML IJ SOLN
INTRAMUSCULAR | Status: AC
  Filled 2014-05-18: qty 5

## 2014-05-18 MED ORDER — SODIUM CHLORIDE 0.9 % IV SOLN
100.0000 mg/m2 | Freq: Once | INTRAVENOUS | Status: AC
  Administered 2014-05-18: 180 mg via INTRAVENOUS
  Filled 2014-05-18: qty 9

## 2014-05-18 MED ORDER — SODIUM CHLORIDE 0.9 % IV SOLN
Freq: Once | INTRAVENOUS | Status: AC
  Administered 2014-05-18: 15:00:00 via INTRAVENOUS

## 2014-05-18 MED ORDER — SODIUM CHLORIDE 0.9 % IV SOLN
370.0000 mg | Freq: Once | INTRAVENOUS | Status: AC
  Administered 2014-05-18: 370 mg via INTRAVENOUS
  Filled 2014-05-18: qty 37

## 2014-05-18 NOTE — Telephone Encounter (Signed)
pt called to r/s appt to later time...done

## 2014-05-18 NOTE — Patient Instructions (Signed)
[facility] Discharge Instructions for Patients Receiving Chemotherapy  Today you received the following chemotherapy agents: carboplatin, etoposide  To help prevent nausea and vomiting after your treatment, we encourage you to take your nausea medication.  Take it as often as prescribed.     If you develop nausea and vomiting that is not controlled by your nausea medication, call the clinic. If it is after clinic hours your family physician or the after hours number for the clinic or go to the Emergency Department.   BELOW ARE SYMPTOMS THAT SHOULD BE REPORTED IMMEDIATELY:  *FEVER GREATER THAN 100.5 F  *CHILLS WITH OR WITHOUT FEVER  NAUSEA AND VOMITING THAT IS NOT CONTROLLED WITH YOUR NAUSEA MEDICATION  *UNUSUAL SHORTNESS OF BREATH  *UNUSUAL BRUISING OR BLEEDING  TENDERNESS IN MOUTH AND THROAT WITH OR WITHOUT PRESENCE OF ULCERS  *URINARY PROBLEMS  *BOWEL PROBLEMS  UNUSUAL RASH Items with * indicate a potential emergency and should be followed up as soon as possible.  Feel free to call the clinic you have any questions or concerns. The clinic phone number is [phone].   I have been informed and understand all the instructions given to me. I know to contact the clinic, my physician, or go to the Emergency Department if any problems should occur. I do not have any questions at this time, but understand that I may call the clinic during office hours   should I have any questions or need assistance in obtaining follow up care.    __________________________________________  _____________  __________ Signature of Patient or Authorized Representative            Date                   Time    __________________________________________ Nurse's Signature

## 2014-05-18 NOTE — Progress Notes (Signed)
OK to treat with serum creatinine of 1.5 per Aw[facility]

## 2014-05-19 ENCOUNTER — Telehealth: Payer: Self-pay | Admitting: *Deleted

## 2014-05-19 ENCOUNTER — Ambulatory Visit (HOSPITAL_BASED_OUTPATIENT_CLINIC_OR_DEPARTMENT_OTHER): Payer: Medicaid Other

## 2014-05-19 ENCOUNTER — Other Ambulatory Visit: Payer: Self-pay | Admitting: *Deleted

## 2014-05-19 ENCOUNTER — Encounter: Payer: Self-pay | Admitting: Radiation Oncology

## 2014-05-19 ENCOUNTER — Ambulatory Visit: Payer: Medicaid Other

## 2014-05-19 VITALS — BP 130/64 | HR 93 | Temp 97.4°F | Resp 17

## 2014-05-19 DIAGNOSIS — Z5111 Encounter for antineoplastic chemotherapy: Secondary | ICD-10-CM

## 2014-05-19 DIAGNOSIS — C7949 Secondary malignant neoplasm of other parts of nervous system: Secondary | ICD-10-CM

## 2014-05-19 DIAGNOSIS — C3410 Malignant neoplasm of upper lobe, unspecified bronchus or lung: Secondary | ICD-10-CM

## 2014-05-19 DIAGNOSIS — C7931 Secondary malignant neoplasm of brain: Secondary | ICD-10-CM

## 2014-05-19 DIAGNOSIS — C3490 Malignant neoplasm of unspecified part of unspecified bronchus or lung: Secondary | ICD-10-CM

## 2014-05-19 MED ORDER — PROCHLORPERAZINE MALEATE 10 MG PO TABS
ORAL_TABLET | ORAL | Status: AC
  Filled 2014-05-19: qty 1

## 2014-05-19 MED ORDER — PROCHLORPERAZINE MALEATE 10 MG PO TABS
10.0000 mg | ORAL_TABLET | Freq: Once | ORAL | Status: AC
  Administered 2014-05-19: 10 mg via ORAL

## 2014-05-19 MED ORDER — SODIUM CHLORIDE 0.9 % IV SOLN
100.0000 mg/m2 | Freq: Once | INTRAVENOUS | Status: AC
  Administered 2014-05-19: 180 mg via INTRAVENOUS
  Filled 2014-05-19: qty 9

## 2014-05-19 MED ORDER — SODIUM CHLORIDE 0.9 % IV SOLN
Freq: Once | INTRAVENOUS | Status: AC
  Administered 2014-05-19: 17:00:00 via INTRAVENOUS

## 2014-05-19 NOTE — Patient Instructions (Signed)
[facility] Discharge Instructions for Patients Receiving Chemotherapy  Today you received the following chemotherapy agents: Etoposide.   To help prevent nausea and vomiting after your treatment, we encourage you to take your nausea medication: Phenergan 25 mg every 6 hrs as needed.    If you develop nausea and vomiting that is not controlled by your nausea medication, call the clinic.   BELOW ARE SYMPTOMS THAT SHOULD BE REPORTED IMMEDIATELY:  *FEVER GREATER THAN 100.5 F  *CHILLS WITH OR WITHOUT FEVER  NAUSEA AND VOMITING THAT IS NOT CONTROLLED WITH YOUR NAUSEA MEDICATION  *UNUSUAL SHORTNESS OF BREATH  *UNUSUAL BRUISING OR BLEEDING  TENDERNESS IN MOUTH AND THROAT WITH OR WITHOUT PRESENCE OF ULCERS  *URINARY PROBLEMS  *BOWEL PROBLEMS  UNUSUAL RASH Items with * indicate a potential emergency and should be followed up as soon as possible.  Feel free to call the clinic you have any questions or concerns. The clinic phone number is [phone].

## 2014-05-19 NOTE — Telephone Encounter (Signed)
Called pt to follow up on missed appt today. Pt [place], he left a message to reschedule. Instructed him to come in now for treatment. He agreed to do so.

## 2014-05-20 ENCOUNTER — Ambulatory Visit (HOSPITAL_BASED_OUTPATIENT_CLINIC_OR_DEPARTMENT_OTHER): Payer: Medicaid Other

## 2014-05-20 ENCOUNTER — Ambulatory Visit
Admission: RE | Admit: 2014-05-20 | Discharge: 2014-05-20 | Disposition: A | Discharge status: Home / Self Care | Payer: Medicaid Other | Source: Ambulatory Visit | Attending: Radiation Oncology | Admitting: Radiation Oncology

## 2014-05-20 VITALS — BP 105/66 | HR 87 | Temp 97.6°F

## 2014-05-20 VITALS — BP 105/68 | HR 77 | Temp 97.5°F | Wt 151.0 lb

## 2014-05-20 DIAGNOSIS — C3410 Malignant neoplasm of upper lobe, unspecified bronchus or lung: Secondary | ICD-10-CM

## 2014-05-20 DIAGNOSIS — C772 Secondary and unspecified malignant neoplasm of intra-abdominal lymph nodes: Secondary | ICD-10-CM

## 2014-05-20 DIAGNOSIS — C3490 Malignant neoplasm of unspecified part of unspecified bronchus or lung: Secondary | ICD-10-CM

## 2014-05-20 DIAGNOSIS — C7931 Secondary malignant neoplasm of brain: Secondary | ICD-10-CM

## 2014-05-20 DIAGNOSIS — C7952 Secondary malignant neoplasm of bone marrow: Secondary | ICD-10-CM

## 2014-05-20 DIAGNOSIS — Z5111 Encounter for antineoplastic chemotherapy: Secondary | ICD-10-CM

## 2014-05-20 DIAGNOSIS — C7951 Secondary malignant neoplasm of bone: Secondary | ICD-10-CM

## 2014-05-20 DIAGNOSIS — C7949 Secondary malignant neoplasm of other parts of nervous system: Secondary | ICD-10-CM

## 2014-05-20 HISTORY — DX: Personal history of irradiation: Z92.3

## 2014-05-20 MED ORDER — PROCHLORPERAZINE MALEATE 10 MG PO TABS
ORAL_TABLET | ORAL | Status: AC
  Filled 2014-05-20: qty 1

## 2014-05-20 MED ORDER — SODIUM CHLORIDE 0.9 % IV SOLN
Freq: Once | INTRAVENOUS | Status: AC
  Administered 2014-05-20: 14:00:00 via INTRAVENOUS

## 2014-05-20 MED ORDER — PROCHLORPERAZINE MALEATE 10 MG PO TABS
10.0000 mg | ORAL_TABLET | Freq: Once | ORAL | Status: AC
  Administered 2014-05-20: 10 mg via ORAL

## 2014-05-20 MED ORDER — ETOPOSIDE CHEMO INJECTION 1 GM/50ML
100.0000 mg/m2 | Freq: Once | INTRAVENOUS | Status: AC
  Administered 2014-05-20: 180 mg via INTRAVENOUS
  Filled 2014-05-20: qty 9

## 2014-05-20 NOTE — Progress Notes (Signed)
Patient here for one month follow up completion of palliative  Radiation of metastatic small lung cancer to left femur (20 gray in 5 fractions).Mild generalized pain of back/hips and knees.Completed another round of chemotherapy today and will get injection tomorrow.No problems from radiation identified.Ct report of chest/abdomen/pelvis attached to documents.

## 2014-05-20 NOTE — Patient Instructions (Addendum)
[facility] Discharge Instructions for Patients Receiving Chemotherapy  Today you received the following chemotherapy agents:  Etoposide  To help prevent nausea and vomiting after your treatment, we encourage you to take your nausea medication as ordered per MD.   If you develop nausea and vomiting that is not controlled by your nausea medication, call the clinic.   BELOW ARE SYMPTOMS THAT SHOULD BE REPORTED IMMEDIATELY:  *FEVER GREATER THAN 100.5 F  *CHILLS WITH OR WITHOUT FEVER  NAUSEA AND VOMITING THAT IS NOT CONTROLLED WITH YOUR NAUSEA MEDICATION  *UNUSUAL SHORTNESS OF BREATH  *UNUSUAL BRUISING OR BLEEDING  TENDERNESS IN MOUTH AND THROAT WITH OR WITHOUT PRESENCE OF ULCERS  *URINARY PROBLEMS  *BOWEL PROBLEMS  UNUSUAL RASH Items with * indicate a potential emergency and should be followed up as soon as possible.  Feel free to call the clinic you have any questions or concerns. The clinic phone number is [phone].   Etoposide, VP-16 capsules What is this medicine? ETOPOSIDE, VP-16 (e toe POE side) is a chemotherapy drug. It is used to treat small cell lung cancer and other cancers. This medicine may be used for other purposes; ask your health care provider or pharmacist if you have questions. COMMON BRAND NAME(S): VePesid What should I tell my health care provider before I take this medicine? They need to know if you have any of these conditions: -infection -kidney disease -low blood counts, like low white cell, platelet, or red cell counts -an unusual or allergic reaction to etoposide, other chemotherapeutic agents, other medicines, foods, dyes, or preservatives -pregnant or trying to get pregnant -breast-feeding How should I use this medicine? Take this medicine by mouth with a glass of water. Follow the directions on the prescription label. Do not open, crush, or chew the capsules. It is advisable to wear gloves when handling this medicine. Take  your medicine at regular intervals. Do not take it more often than directed. Do not stop taking except on your doctor's advice. Talk to your pediatrician regarding the use of this medicine in children. Special care may be needed. Overdosage: If you think you have taken too much of this medicine contact a poison control center or emergency room at once. NOTE: This medicine is only for you. Do not share this medicine with others. What if I miss a dose? If you miss a dose, take it as soon as you can. If it is almost time for your next dose, take only that dose. Do not take double or extra doses. What may interact with this medicine? -cyclosporine -medicines to increase blood counts like filgrastim, pegfilgrastim, sargramostim -vaccines This list may not describe all possible interactions. Give your health care provider a list of all the medicines, herbs, non-prescription drugs, or dietary supplements you use. Also tell them if you smoke, drink alcohol, or use illegal drugs. Some items may interact with your medicine. What should I watch for while using this medicine? Visit your doctor for checks on your progress. This drug may make you feel generally unwell. This is not uncommon, as chemotherapy can affect healthy cells as well as cancer cells. Report any side effects. Continue your course of treatment even though you feel ill unless your doctor tells you to stop. In some cases, you may be given additional medicines to help with side effects. Follow all directions for their use. Call your doctor or health care professional for advice if you get a fever, chills or sore throat, or other symptoms of  a cold or flu. Do not treat yourself. This drug decreases your body's ability to fight infections. Try to avoid being around people who are sick. This medicine may increase your risk to bruise or bleed. Call your doctor or health care professional if you notice any unusual bleeding. Be careful brushing and  flossing your teeth or using a toothpick because you may get an infection or bleed more easily. If you have any dental work done, tell your dentist you are receiving this medicine. Avoid taking products that contain aspirin, acetaminophen, ibuprofen, naproxen, or ketoprofen unless instructed by your doctor. These medicines may hide a fever. Do not become pregnant while taking this medicine. Women should inform their doctor if they wish to become pregnant or think they might be pregnant. There is a potential for serious side effects to an unborn child. Talk to your health care professional or pharmacist for more information. Do not breast-feed an infant while taking this medicine. What side effects may I notice from receiving this medicine? Side effects that you should report to your doctor or health care professional as soon as possible: -allergic reactions like skin rash, itching or hives, swelling of the face, lips, or tongue -low blood counts - this medicine may decrease the number of white blood cells, red blood cells and platelets. You may be at increased risk for infections and bleeding. -signs of infection - fever or chills, cough, sore throat, pain or difficulty passing urine -signs of decreased platelets or bleeding - bruising, pinpoint red spots on the skin, black, tarry stools, blood in the urine -signs of decreased red blood cells - unusually weak or tired, fainting spells, lightheadedness -breathing problems -changes in vision -mouth or throat sores or ulcers -pain, tingling, numbness in the hands or feet -redness, blistering, peeling or loosening of the skin, including inside the mouth -seizures -vomiting Side effects that usually do not require medical attention (report to your doctor or health care professional if they continue or are bothersome): -change in taste -diarrhea -hair loss -nausea -stomach pain This list may not describe all possible side effects. Call your doctor  for medical advice about side effects. You may report side effects to FDA at 1-800-FDA-1088. Where should I keep my medicine? Keep out of the reach of children. Store in a refrigerator between 2 and 8 degrees C (36 and 46 degrees F). Do not freeze. Throw away any unused medicine after the expiration date. NOTE: This sheet is a summary. It may not cover all possible information. If you have questions about this medicine, talk to your doctor, pharmacist, or health care provider.  2014, Elsevier/Gold Standard. (2008-04-05 17:22:30)

## 2014-05-21 ENCOUNTER — Ambulatory Visit: Payer: Medicaid Other

## 2014-05-21 ENCOUNTER — Ambulatory Visit (HOSPITAL_BASED_OUTPATIENT_CLINIC_OR_DEPARTMENT_OTHER): Payer: Medicaid Other

## 2014-05-21 VITALS — BP 115/62 | HR 86 | Temp 97.4°F

## 2014-05-21 DIAGNOSIS — C3410 Malignant neoplasm of upper lobe, unspecified bronchus or lung: Secondary | ICD-10-CM

## 2014-05-21 DIAGNOSIS — C3490 Malignant neoplasm of unspecified part of unspecified bronchus or lung: Secondary | ICD-10-CM

## 2014-05-21 DIAGNOSIS — C7931 Secondary malignant neoplasm of brain: Secondary | ICD-10-CM

## 2014-05-21 DIAGNOSIS — C7949 Secondary malignant neoplasm of other parts of nervous system: Secondary | ICD-10-CM

## 2014-05-21 DIAGNOSIS — Z5189 Encounter for other specified aftercare: Secondary | ICD-10-CM

## 2014-05-21 MED ORDER — PEGFILGRASTIM INJECTION 6 MG/0.6ML
6.0000 mg | Freq: Once | SUBCUTANEOUS | Status: AC
  Administered 2014-05-21: 6 mg via SUBCUTANEOUS
  Filled 2014-05-21: qty 0.6

## 2014-05-21 NOTE — Patient Instructions (Signed)

## 2014-05-21 NOTE — Progress Notes (Signed)
Radiation Oncology         [phone] ________________________________  Name: Jacob Webb MRN: [id number]  Date: 05/20/2014  DOB: 1951/01/16  Follow-Up Visit Note  CC: No PCP Per Patient  Curt Bears, MD  Diagnosis:   Extensive stage small cell lung cancer  Interval Since Last Radiation:  5 weeks   Narrative:  The patient returns today for routine follow-up.  The patient states he feels relatively well today. He states that he has some pain on occasion but really does not have any one area which is really bothering him at this time. No significant pain in the left hip/femur region. No ongoing nausea or diarrhea. The patient recently had a repeat CT scan of the chest abdomen and pelvis on 05/05/2014. Unfortunately this showed progressive disease. He has begun additional chemotherapy with Dr. Earlie Server and he states that this initially has gone fine.                              ALLERGIES:  has No Known Allergies.  Meds: Current Outpatient Prescriptions  Medication Sig Dispense Refill  . HYDROcodone-acetaminophen (NORCO/VICODIN) 5-325 MG per tablet Take 1-2 tablets by mouth every 4 (four) hours as needed.  60 tablet  0  . omeprazole (PRILOSEC) 40 MG capsule Take 40 mg by mouth daily.      . promethazine (PHENERGAN) 25 MG tablet Take 1 tablet (25 mg total) by mouth every 6 (six) hours as needed for nausea.  30 tablet  0   No current facility-administered medications for this encounter.    Physical Findings: The patient is in no acute distress. Patient is alert and oriented.  weight is 151 lb (68.493 kg). His temperature is 97.5 F (36.4 C). His blood pressure is 105/68 and his pulse is 77. His oxygen saturation is 100%. .     Lab Findings: Lab Results  Component Value Date   WBC 4.9 05/18/2014   HGB 12.1* 05/18/2014   HCT 36.8* 05/18/2014   MCV 89.8 05/18/2014   PLT 175 05/18/2014     Radiographic Findings: Ct Abdomen Pelvis Wo Contrast  05/05/2014   CLINICAL DATA:  Lung  cancer diagnosed in 2014. Restaging. Upper and lower abdominal pain. Chemotherapy and progress. Radiation therapy completed 01/26/2014. Extensive stage small-cell lung cancer. Radiotherapy to the abdomen.  EXAM: CT CHEST, ABDOMEN AND PELVIS WITHOUT CONTRAST  TECHNIQUE: Multidetector CT imaging of the chest, abdomen and pelvis was performed following the standard protocol without IV contrast.  COMPARISON:  03/04/2014  FINDINGS: CT CHEST FINDINGS  Lungs/Pleura: Lower lobe predominant bronchial wall thickening. Moderate centrilobular emphysema with biapical pleural parenchymal scarring.  Further decrease in soft tissue thickening about the posterior left upper lobe. 1.0 x 1.0 cm today versus 1.5 x 1.0 cm on the prior exam. Image 15 today.  No pleural fluid.  Heart/Mediastinum: No supraclavicular adenopathy. Aortic and branch vessel atherosclerosis. Normal heart size, without pericardial effusion. No well-defined mediastinal or right hilar adenopathy. Soft tissue thickening is identified along the left upper lobe bronchovascular interstitium. This measures 8 mm on image 9 sphenoid versus similar on the prior. Favored to the treatment related.  CT ABDOMEN AND PELVIS FINDINGS  Abdomen/Pelvis: Old granulomatous disease in the liver. Possible capsular based nodule along the inferior right lobe of the liver on image 70. 9 mm. Possible vague hypoattenuation in the right lobe of the liver on image 68 transverse and image 35 coronal.  Normal noncontrast appearance  of the spleen, stomach, pancreas, gallbladder, biliary tract, adrenal glands. An 8 mm nodule which is favored to be adjacent to the left kidney on image 67 is slightly more conspicuous than at 6 mm on the prior  Right suprarenal nodule has markedly enlarged, 2.4 x 2.7 cm on image 62 today versus 8 mm on the prior.  A right perirenal nodule measures 2.8 x 1.6 cm on image 68 versus 2.1 x 1.8 cm on the prior exam.  Development of moderate right-sided  hydroureteronephrosis. This continues to the level of an enlarged right retroperitoneal (pericaval) 1.7 x 2.7 cm node on image 82/series 2.  Aortic and branch vessel atherosclerosis. Normal colon and terminal ileum. Normal small bowel without abdominal ascites. No pelvic adenopathy. Normal urinary bladder and prostate. No significant free fluid.  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  IMPRESSION: CT CHEST IMPRESSION  1. Further decrease in size of a left upper lobe pulmonary nodule. No evidence of thoracic adenopathy. 2. Soft tissue fullness about the left suprahilar region, favored to be treatment related. This is similar and warrants followup attention.  CT ABDOMEN AND PELVIS IMPRESSION  1. Progression of retroperitoneal metastasis. Progressive right and developing left-sided retroperitoneal nodes/nodules. 2. Development of right-sided moderate hydroureteronephrosis secondary to a retroperitoneal nodal metastasis. 3. Decrease sensitivity secondary to lack of intravenous contrast. Cannot exclude right hepatic lobe metastasis. If confirmation is desired, abdominal MRI (preferably with and without contrast) should be considered.   Electronically Signed   By: Abigail Miyamoto M.D.   On: 05/05/2014 16:07   Ct Chest Wo Contrast  05/05/2014   CLINICAL DATA:  Lung cancer diagnosed in 2014. Restaging. Upper and lower abdominal pain. Chemotherapy and progress. Radiation therapy completed 01/26/2014. Extensive stage small-cell lung cancer. Radiotherapy to the abdomen.  EXAM: CT CHEST, ABDOMEN AND PELVIS WITHOUT CONTRAST  TECHNIQUE: Multidetector CT imaging of the chest, abdomen and pelvis was performed following the standard protocol without IV contrast.  COMPARISON:  03/04/2014  FINDINGS: CT CHEST FINDINGS  Lungs/Pleura: Lower lobe predominant bronchial wall thickening. Moderate centrilobular emphysema with biapical pleural parenchymal scarring.  Further decrease in soft tissue  thickening about the posterior left upper lobe. 1.0 x 1.0 cm today versus 1.5 x 1.0 cm on the prior exam. Image 15 today.  No pleural fluid.  Heart/Mediastinum: No supraclavicular adenopathy. Aortic and branch vessel atherosclerosis. Normal heart size, without pericardial effusion. No well-defined mediastinal or right hilar adenopathy. Soft tissue thickening is identified along the left upper lobe bronchovascular interstitium. This measures 8 mm on image 9 sphenoid versus similar on the prior. Favored to the treatment related.  CT ABDOMEN AND PELVIS FINDINGS  Abdomen/Pelvis: Old granulomatous disease in the liver. Possible capsular based nodule along the inferior right lobe of the liver on image 70. 9 mm. Possible vague hypoattenuation in the right lobe of the liver on image 68 transverse and image 35 coronal.  Normal noncontrast appearance of the spleen, stomach, pancreas, gallbladder, biliary tract, adrenal glands. An 8 mm nodule which is favored to be adjacent to the left kidney on image 67 is slightly more conspicuous than at 6 mm on the prior  Right suprarenal nodule has markedly enlarged, 2.4 x 2.7 cm on image 62 today versus 8 mm on the prior.  A right perirenal nodule measures 2.8 x 1.6 cm on image 68 versus 2.1 x 1.8 cm on the prior exam.  Development of moderate right-sided hydroureteronephrosis. This continues to the level of an enlarged right retroperitoneal (pericaval)  1.7 x 2.7 cm node on image 82/series 2.  Aortic and branch vessel atherosclerosis. Normal colon and terminal ileum. Normal small bowel without abdominal ascites. No pelvic adenopathy. Normal urinary bladder and prostate. No significant free fluid.  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  IMPRESSION: CT CHEST IMPRESSION  1. Further decrease in size of a left upper lobe pulmonary nodule. No evidence of thoracic adenopathy. 2. Soft tissue fullness about the left suprahilar region, favored to be  treatment related. This is similar and warrants followup attention.  CT ABDOMEN AND PELVIS IMPRESSION  1. Progression of retroperitoneal metastasis. Progressive right and developing left-sided retroperitoneal nodes/nodules. 2. Development of right-sided moderate hydroureteronephrosis secondary to a retroperitoneal nodal metastasis. 3. Decrease sensitivity secondary to lack of intravenous contrast. Cannot exclude right hepatic lobe metastasis. If confirmation is desired, abdominal MRI (preferably with and without contrast) should be considered.   Electronically Signed   By: Abigail Miyamoto M.D.   On: 05/05/2014 16:07    Impression:    The patient has begun additional chemotherapy with Dr. Julien Nordmann do to progression seen on his recent CT imaging.  Plan:  We will have the patient return to our clinic in 4 months for ongoing followup. Be happy to see the patient sooner if needed to address any specific issue.   Jodelle Gross, M.D., Ph.D.

## 2014-05-22 ENCOUNTER — Ambulatory Visit: Payer: Medicaid Other

## 2014-05-24 ENCOUNTER — Other Ambulatory Visit (HOSPITAL_BASED_OUTPATIENT_CLINIC_OR_DEPARTMENT_OTHER): Payer: Medicaid Other

## 2014-05-24 ENCOUNTER — Other Ambulatory Visit: Payer: Self-pay | Admitting: Radiation Oncology

## 2014-05-24 ENCOUNTER — Telehealth: Payer: Self-pay | Admitting: *Deleted

## 2014-05-24 DIAGNOSIS — C7949 Secondary malignant neoplasm of other parts of nervous system: Secondary | ICD-10-CM

## 2014-05-24 DIAGNOSIS — C3410 Malignant neoplasm of upper lobe, unspecified bronchus or lung: Secondary | ICD-10-CM

## 2014-05-24 DIAGNOSIS — C7931 Secondary malignant neoplasm of brain: Secondary | ICD-10-CM

## 2014-05-24 DIAGNOSIS — C3490 Malignant neoplasm of unspecified part of unspecified bronchus or lung: Secondary | ICD-10-CM

## 2014-05-24 LAB — CBC WITH DIFFERENTIAL/PLATELET
BASO%: 0.5 % (ref 0.0–2.0)
Basophils Absolute: 0 10*3/uL (ref 0.0–0.1)
EOS%: 0.7 % (ref 0.0–7.0)
Eosinophils Absolute: 0 10*3/uL (ref 0.0–0.5)
HEMATOCRIT: 39 % (ref 38.4–49.9)
HGB: 12.8 g/dL — ABNORMAL LOW (ref 13.0–17.1)
LYMPH%: 10.2 % — AB (ref 14.0–49.0)
MCH: 29.8 pg (ref 27.2–33.4)
MCHC: 32.9 g/dL (ref 32.0–36.0)
MCV: 90.5 fL (ref 79.3–98.0)
MONO#: 0.1 10*3/uL (ref 0.1–0.9)
MONO%: 2 % (ref 0.0–14.0)
NEUT#: 5.5 10*3/uL (ref 1.5–6.5)
NEUT%: 86.6 % — ABNORMAL HIGH (ref 39.0–75.0)
Platelets: 117 10*3/uL — ABNORMAL LOW (ref 140–400)
RBC: 4.31 10*6/uL (ref 4.20–5.82)
RDW: 14.5 % (ref 11.0–14.6)
WBC: 6.4 10*3/uL (ref 4.0–10.3)
lymph#: 0.6 10*3/uL — ABNORMAL LOW (ref 0.9–3.3)

## 2014-05-24 LAB — COMPREHENSIVE METABOLIC PANEL (CC13)
ALT: 18 U/L (ref 0–55)
ANION GAP: 8 meq/L (ref 3–11)
AST: 19 U/L (ref 5–34)
Albumin: 3.6 g/dL (ref 3.5–5.0)
Alkaline Phosphatase: 162 U/L — ABNORMAL HIGH (ref 40–150)
BUN: 36.8 mg/dL — ABNORMAL HIGH (ref 7.0–26.0)
CO2: 26 meq/L (ref 22–29)
Calcium: 9.8 mg/dL (ref 8.4–10.4)
Chloride: 105 mEq/L (ref 98–109)
Creatinine: 1.3 mg/dL (ref 0.7–1.3)
Glucose: 89 mg/dl (ref 70–140)
Potassium: 4.1 mEq/L (ref 3.5–5.1)
Sodium: 139 mEq/L (ref 136–145)
Total Bilirubin: 0.74 mg/dL (ref 0.20–1.20)
Total Protein: 7 g/dL (ref 6.4–8.3)

## 2014-05-24 MED ORDER — HYDROCODONE-ACETAMINOPHEN 5-325 MG PO TABS: 1.0000 | ORAL_TABLET | ORAL | Status: DC | PRN

## 2014-05-24 NOTE — Telephone Encounter (Signed)
Called [facility] home phone, spoke with Jacob Webb, he can pick up his rx today  By 5pm, He stated he would be there before then and thank Dr.moody and this RN for the return call so soon 10:18 AM

## 2014-05-24 NOTE — Telephone Encounter (Signed)
Patient had called requesting  His mediation be refilled, left voice message said he could be here at 2pm  For labs and would like to pick up rx themn, waiting call b ack 9:59 AM

## 2014-05-31 ENCOUNTER — Other Ambulatory Visit (HOSPITAL_BASED_OUTPATIENT_CLINIC_OR_DEPARTMENT_OTHER): Payer: Medicaid Other

## 2014-05-31 ENCOUNTER — Telehealth: Payer: Self-pay | Admitting: Internal Medicine

## 2014-05-31 DIAGNOSIS — C3410 Malignant neoplasm of upper lobe, unspecified bronchus or lung: Secondary | ICD-10-CM

## 2014-05-31 DIAGNOSIS — C3490 Malignant neoplasm of unspecified part of unspecified bronchus or lung: Secondary | ICD-10-CM

## 2014-05-31 LAB — COMPREHENSIVE METABOLIC PANEL (CC13)
ALBUMIN: 3.4 g/dL — AB (ref 3.5–5.0)
ALT: 16 U/L (ref 0–55)
AST: 15 U/L (ref 5–34)
Alkaline Phosphatase: 162 U/L — ABNORMAL HIGH (ref 40–150)
Anion Gap: 7 mEq/L (ref 3–11)
BUN: 17 mg/dL (ref 7.0–26.0)
CALCIUM: 9.5 mg/dL (ref 8.4–10.4)
CHLORIDE: 107 meq/L (ref 98–109)
CO2: 29 mEq/L (ref 22–29)
Creatinine: 1.3 mg/dL (ref 0.7–1.3)
GLUCOSE: 164 mg/dL — AB (ref 70–140)
POTASSIUM: 4 meq/L (ref 3.5–5.1)
Sodium: 142 mEq/L (ref 136–145)
Total Bilirubin: 0.21 mg/dL (ref 0.20–1.20)
Total Protein: 6.5 g/dL (ref 6.4–8.3)

## 2014-05-31 LAB — CBC WITH DIFFERENTIAL/PLATELET
BASO%: 0.3 % (ref 0.0–2.0)
BASOS ABS: 0 10*3/uL (ref 0.0–0.1)
EOS%: 0.4 % (ref 0.0–7.0)
Eosinophils Absolute: 0 10*3/uL (ref 0.0–0.5)
HCT: 32.2 % — ABNORMAL LOW (ref 38.4–49.9)
HEMOGLOBIN: 10.9 g/dL — AB (ref 13.0–17.1)
LYMPH%: 12.2 % — ABNORMAL LOW (ref 14.0–49.0)
MCH: 30.1 pg (ref 27.2–33.4)
MCHC: 33.9 g/dL (ref 32.0–36.0)
MCV: 89 fL (ref 79.3–98.0)
MONO#: 0.8 10*3/uL (ref 0.1–0.9)
MONO%: 9.5 % (ref 0.0–14.0)
NEUT#: 6.1 10*3/uL (ref 1.5–6.5)
NEUT%: 77.6 % — AB (ref 39.0–75.0)
Platelets: 44 10*3/uL — ABNORMAL LOW (ref 140–400)
RBC: 3.62 10*6/uL — ABNORMAL LOW (ref 4.20–5.82)
RDW: 14.6 % (ref 11.0–14.6)
WBC: 7.9 10*3/uL (ref 4.0–10.3)
lymph#: 1 10*3/uL (ref 0.9–3.3)
nRBC: 0 % (ref 0–0)

## 2014-05-31 NOTE — Telephone Encounter (Signed)
pt came in and advised that he will be going out of town...per MM ok to shift days to start on wednesday

## 2014-06-07 ENCOUNTER — Ambulatory Visit: Payer: Medicaid Other | Admitting: Internal Medicine

## 2014-06-07 ENCOUNTER — Other Ambulatory Visit: Payer: Medicaid Other

## 2014-06-07 ENCOUNTER — Ambulatory Visit: Payer: Medicaid Other

## 2014-06-08 ENCOUNTER — Ambulatory Visit: Payer: Medicaid Other

## 2014-06-09 ENCOUNTER — Ambulatory Visit: Payer: Medicaid Other

## 2014-06-09 ENCOUNTER — Telehealth: Payer: Self-pay | Admitting: *Deleted

## 2014-06-09 ENCOUNTER — Other Ambulatory Visit: Payer: Medicaid Other

## 2014-06-09 ENCOUNTER — Ambulatory Visit: Payer: Medicaid Other | Admitting: Nurse Practitioner

## 2014-06-09 NOTE — Telephone Encounter (Signed)
[facility] for lab/ f/u and chemo today, called and left a message on vm to call us back.

## 2014-06-10 ENCOUNTER — Ambulatory Visit: Payer: Medicaid Other

## 2014-06-11 ENCOUNTER — Telehealth: Payer: Self-pay | Admitting: Internal Medicine

## 2014-06-11 ENCOUNTER — Ambulatory Visit: Payer: Medicaid Other

## 2014-06-11 NOTE — Telephone Encounter (Signed)
lvm for pt with new appt d.t.Marland KitchenMarland Kitchen

## 2014-06-12 ENCOUNTER — Ambulatory Visit: Payer: Medicaid Other

## 2014-06-14 ENCOUNTER — Other Ambulatory Visit: Payer: Medicaid Other

## 2014-06-15 ENCOUNTER — Ambulatory Visit (HOSPITAL_BASED_OUTPATIENT_CLINIC_OR_DEPARTMENT_OTHER): Payer: Medicaid Other | Admitting: Nurse Practitioner

## 2014-06-15 ENCOUNTER — Other Ambulatory Visit (HOSPITAL_BASED_OUTPATIENT_CLINIC_OR_DEPARTMENT_OTHER): Payer: Medicaid Other

## 2014-06-15 ENCOUNTER — Ambulatory Visit (HOSPITAL_BASED_OUTPATIENT_CLINIC_OR_DEPARTMENT_OTHER): Payer: Medicaid Other

## 2014-06-15 VITALS — BP 117/67 | HR 77 | Temp 97.9°F | Resp 20 | Ht 70.0 in | Wt 147.4 lb

## 2014-06-15 DIAGNOSIS — C7949 Secondary malignant neoplasm of other parts of nervous system: Secondary | ICD-10-CM

## 2014-06-15 DIAGNOSIS — C7931 Secondary malignant neoplasm of brain: Secondary | ICD-10-CM

## 2014-06-15 DIAGNOSIS — C3410 Malignant neoplasm of upper lobe, unspecified bronchus or lung: Secondary | ICD-10-CM

## 2014-06-15 DIAGNOSIS — C3490 Malignant neoplasm of unspecified part of unspecified bronchus or lung: Secondary | ICD-10-CM

## 2014-06-15 DIAGNOSIS — Z5111 Encounter for antineoplastic chemotherapy: Secondary | ICD-10-CM

## 2014-06-15 DIAGNOSIS — C786 Secondary malignant neoplasm of retroperitoneum and peritoneum: Secondary | ICD-10-CM

## 2014-06-15 DIAGNOSIS — C7951 Secondary malignant neoplasm of bone: Secondary | ICD-10-CM

## 2014-06-15 DIAGNOSIS — C7952 Secondary malignant neoplasm of bone marrow: Secondary | ICD-10-CM

## 2014-06-15 LAB — COMPREHENSIVE METABOLIC PANEL (CC13)
ALBUMIN: 3.4 g/dL — AB (ref 3.5–5.0)
ALT: 13 U/L (ref 0–55)
AST: 21 U/L (ref 5–34)
Alkaline Phosphatase: 117 U/L (ref 40–150)
Anion Gap: 8 mEq/L (ref 3–11)
BUN: 23.3 mg/dL (ref 7.0–26.0)
CO2: 26 meq/L (ref 22–29)
Calcium: 9.7 mg/dL (ref 8.4–10.4)
Chloride: 107 mEq/L (ref 98–109)
Creatinine: 1.3 mg/dL (ref 0.7–1.3)
GLUCOSE: 106 mg/dL (ref 70–140)
POTASSIUM: 4.2 meq/L (ref 3.5–5.1)
SODIUM: 141 meq/L (ref 136–145)
Total Bilirubin: 0.2 mg/dL (ref 0.20–1.20)
Total Protein: 6.4 g/dL (ref 6.4–8.3)

## 2014-06-15 LAB — CBC WITH DIFFERENTIAL/PLATELET
BASO%: 2 % (ref 0.0–2.0)
Basophils Absolute: 0.2 10*3/uL — ABNORMAL HIGH (ref 0.0–0.1)
EOS ABS: 0.1 10*3/uL (ref 0.0–0.5)
EOS%: 0.7 % (ref 0.0–7.0)
HCT: 31.7 % — ABNORMAL LOW (ref 38.4–49.9)
HGB: 10.7 g/dL — ABNORMAL LOW (ref 13.0–17.1)
LYMPH%: 11.2 % — ABNORMAL LOW (ref 14.0–49.0)
MCH: 30.6 pg (ref 27.2–33.4)
MCHC: 33.6 g/dL (ref 32.0–36.0)
MCV: 90.9 fL (ref 79.3–98.0)
MONO#: 1.3 10*3/uL — AB (ref 0.1–0.9)
MONO%: 16.4 % — ABNORMAL HIGH (ref 0.0–14.0)
NEUT%: 69.7 % (ref 39.0–75.0)
NEUTROS ABS: 5.6 10*3/uL (ref 1.5–6.5)
Platelets: 233 10*3/uL (ref 140–400)
RBC: 3.48 10*6/uL — AB (ref 4.20–5.82)
RDW: 17.8 % — ABNORMAL HIGH (ref 11.0–14.6)
WBC: 8 10*3/uL (ref 4.0–10.3)
lymph#: 0.9 10*3/uL (ref 0.9–3.3)

## 2014-06-15 MED ORDER — ACETAMINOPHEN 325 MG PO TABS
650.0000 mg | ORAL_TABLET | Freq: Once | ORAL | Status: AC
  Administered 2014-06-15: 650 mg via ORAL

## 2014-06-15 MED ORDER — SODIUM CHLORIDE 0.9 % IV SOLN
406.5000 mg | Freq: Once | INTRAVENOUS | Status: AC
  Administered 2014-06-15: 410 mg via INTRAVENOUS
  Filled 2014-06-15: qty 41

## 2014-06-15 MED ORDER — ONDANSETRON 16 MG/50ML IVPB (CHCC)
16.0000 mg | Freq: Once | INTRAVENOUS | Status: AC
  Administered 2014-06-15: 16 mg via INTRAVENOUS

## 2014-06-15 MED ORDER — SODIUM CHLORIDE 0.9 % IV SOLN
Freq: Once | INTRAVENOUS | Status: AC
  Administered 2014-06-15: 16:00:00 via INTRAVENOUS

## 2014-06-15 MED ORDER — OXYCODONE-ACETAMINOPHEN 5-325 MG PO TABS: 1.0000 | ORAL_TABLET | Freq: Four times a day (QID) | ORAL | Status: DC | PRN

## 2014-06-15 MED ORDER — CARBOPLATIN CHEMO INTRADERMAL TEST DOSE 100MCG/0.02ML
100.0000 ug | Freq: Once | INTRADERMAL | Status: AC
  Administered 2014-06-15: 100 ug via INTRADERMAL
  Filled 2014-06-15: qty 0.01

## 2014-06-15 MED ORDER — ONDANSETRON 16 MG/50ML IVPB (CHCC)
INTRAVENOUS | Status: AC
  Filled 2014-06-15: qty 16

## 2014-06-15 MED ORDER — DEXAMETHASONE SODIUM PHOSPHATE 20 MG/5ML IJ SOLN
20.0000 mg | Freq: Once | INTRAMUSCULAR | Status: AC
  Administered 2014-06-15: 20 mg via INTRAVENOUS

## 2014-06-15 MED ORDER — ETOPOSIDE CHEMO INJECTION 1 GM/50ML
100.0000 mg/m2 | Freq: Once | INTRAVENOUS | Status: AC
  Administered 2014-06-15: 180 mg via INTRAVENOUS
  Filled 2014-06-15: qty 9

## 2014-06-15 MED ORDER — DEXAMETHASONE SODIUM PHOSPHATE 20 MG/5ML IJ SOLN
INTRAMUSCULAR | Status: AC
  Filled 2014-06-15: qty 5

## 2014-06-15 MED ORDER — ACETAMINOPHEN 325 MG PO TABS
ORAL_TABLET | ORAL | Status: AC
  Filled 2014-06-15: qty 2

## 2014-06-15 NOTE — Progress Notes (Addendum)
Jacob Webb OFFICE PROGRESS NOTE   Diagnosis:  Extensive stage small cell lung cancer.  INTERVAL HISTORY:   Jacob Webb returns after missing a followup visit last week. He was recently found to have disease progression and completed cycle 1 carboplatin/etoposide beginning 05/18/2014.  He denies nausea/vomiting. No mouth sores. No diarrhea. Bowels have been moving regularly. No shortness breath, cough or fever. He reports a good appetite. Energy level is poor.  Over the past 3-4 days he has been experiencing pain at the right lower anterior chest/upper abdomen and also in the right flank region. He is taking Vicodin with incomplete relief. He describes the pain as sharp. The pain was initially intermittent. He reports fairly constant pain today.  Objective:  Vital signs in last 24 hours:  Blood pressure 117/67, pulse 77, temperature 97.9 F (36.6 C), temperature source Oral, resp. rate 20, height 5\' 10"  (1.778 m), weight 147 lb 6.4 oz (66.86 kg).    HEENT: No thrush or ulcerations. Lymphatics: No palpable cervical, supraclavicular lymph nodes. Resp: Lungs clear. Cardio: Regular cardiac rhythm. Heart sounds are distant. GI: Abdomen is soft. No hepatomegaly. Tender at the right upper abdomen. Vascular: No leg edema. MSK: Tender over the right flank region.    Lab Results:  Lab Results  Component Value Date   WBC 8.0 06/15/2014   HGB 10.7* 06/15/2014   HCT 31.7* 06/15/2014   MCV 90.9 06/15/2014   PLT 233 06/15/2014   NEUTROABS 5.6 06/15/2014    Imaging:  No results found.  Medications: I have reviewed the patient's current medications.  Assessment/Plan: 1. Extensive stage small cell lung cancer initially diagnosed May 2014.   Status post whole brain radiation.   Status post palliative radiation to the chest.   He completed 5 cycles of carboplatin/etoposide 05/25/2013 through September 2014.   Palliative radiation to abdominal metastasis adjacent to the  right kidney 01/07/2014 through 01/26/2014.   Palliative radiation to the left femur April 2015.   Restaging CT evaluation 05/05/2014 with further decrease in the size of a left upper lobe pulmonary nodule. Soft tissue fullness about the left suprahilar region favored to be treatment-related. Progression of retroperitoneal metastasis. Progressive right and developing left-sided retroperitoneal nodes/nodules. Development of right-sided moderate hydroureteronephrosis secondary to retroperitoneal nodal metastasis.   Initiation of carboplatin/etoposide 05/18/2014. 2. Right flank and upper abdominal pain. Question due to hydronephrosis, question retroperitoneal adenopathy.   Disposition: Jacob Webb has completed one cycle of carboplatin/etoposide. Plan to proceed with cycle 2 today as scheduled. Restaging CT evaluation per Dr. Julien Nordmann before cycle 3.  He is having right flank and upper abdominal pain. The pain may be due to hydronephrosis and/or retroperitoneal adenopathy. We gave him a prescription for Percocet 5/325 one to 2 tablets every 6 hours as needed. We made a referral to urology for consideration of stent placement.  Jacob Webb will return for a followup visit on 07/06/2014 prior to cycle 3 carboplatin/etoposide. He knows to contact the office in the interim with any problems. We specifically discussed worsening pain.  Patient seen with Dr. Julien Nordmann.    [name] ANP/GNP-BC   06/15/2014  3:22 PM  ADDENDUM: Hematology/Oncology Attending: I had a face to face encounter with the patient today. I recommended his care plan. This is a very pleasant 63 years old white male with recurrent small cell lung cancer. He was started again on systemic chemotherapy with carboplatin and etoposide status post 1 cycle. He missed his treatment last week because of family issues and  he is here today to resume her systemic chemotherapy with the second cycle. He tolerated the first cycle fairly well with  no significant adverse effect. I recommended for the patient to proceed with cycle #2 today as scheduled. He would come back for follow up visit in 3 weeks after repeating CT scan of the chest, abdomen and pelvis for restaging of his disease. He was advised to call immediately if he has any concerning symptoms in the interval. [name]., MD 06/15/2014

## 2014-06-15 NOTE — Patient Instructions (Signed)
[facility] Discharge Instructions for Patients Receiving Chemotherapy  Today you received the following chemotherapy agents Etoposide and Carboplatin.  To help prevent nausea and vomiting after your treatment, we encourage you to take your nausea medication.   If you develop nausea and vomiting that is not controlled by your nausea medication, call the clinic.   BELOW ARE SYMPTOMS THAT SHOULD BE REPORTED IMMEDIATELY:  *FEVER GREATER THAN 100.5 F  *CHILLS WITH OR WITHOUT FEVER  NAUSEA AND VOMITING THAT IS NOT CONTROLLED WITH YOUR NAUSEA MEDICATION  *UNUSUAL SHORTNESS OF BREATH  *UNUSUAL BRUISING OR BLEEDING  TENDERNESS IN MOUTH AND THROAT WITH OR WITHOUT PRESENCE OF ULCERS  *URINARY PROBLEMS  *BOWEL PROBLEMS  UNUSUAL RASH Items with * indicate a potential emergency and should be followed up as soon as possible.  Feel free to call the clinic you have any questions or concerns. The clinic phone number is [phone].

## 2014-06-16 ENCOUNTER — Other Ambulatory Visit: Payer: Medicaid Other

## 2014-06-16 ENCOUNTER — Ambulatory Visit: Payer: Medicaid Other | Admitting: Nurse Practitioner

## 2014-06-16 ENCOUNTER — Ambulatory Visit (HOSPITAL_BASED_OUTPATIENT_CLINIC_OR_DEPARTMENT_OTHER): Payer: Medicaid Other

## 2014-06-16 ENCOUNTER — Ambulatory Visit: Payer: Medicaid Other

## 2014-06-16 VITALS — BP 122/65 | HR 99 | Temp 97.4°F | Resp 18

## 2014-06-16 DIAGNOSIS — C7949 Secondary malignant neoplasm of other parts of nervous system: Secondary | ICD-10-CM

## 2014-06-16 DIAGNOSIS — Z5111 Encounter for antineoplastic chemotherapy: Secondary | ICD-10-CM

## 2014-06-16 DIAGNOSIS — C3490 Malignant neoplasm of unspecified part of unspecified bronchus or lung: Secondary | ICD-10-CM

## 2014-06-16 DIAGNOSIS — C786 Secondary malignant neoplasm of retroperitoneum and peritoneum: Secondary | ICD-10-CM

## 2014-06-16 DIAGNOSIS — C7931 Secondary malignant neoplasm of brain: Secondary | ICD-10-CM

## 2014-06-16 DIAGNOSIS — C3410 Malignant neoplasm of upper lobe, unspecified bronchus or lung: Secondary | ICD-10-CM

## 2014-06-16 MED ORDER — PROCHLORPERAZINE MALEATE 10 MG PO TABS
10.0000 mg | ORAL_TABLET | Freq: Once | ORAL | Status: AC
  Administered 2014-06-16: 10 mg via ORAL

## 2014-06-16 MED ORDER — PROCHLORPERAZINE MALEATE 10 MG PO TABS
ORAL_TABLET | ORAL | Status: AC
  Filled 2014-06-16: qty 1

## 2014-06-16 MED ORDER — SODIUM CHLORIDE 0.9 % IV SOLN
Freq: Once | INTRAVENOUS | Status: AC
  Administered 2014-06-16: 14:00:00 via INTRAVENOUS

## 2014-06-16 MED ORDER — SODIUM CHLORIDE 0.9 % IV SOLN
100.0000 mg/m2 | Freq: Once | INTRAVENOUS | Status: AC
  Administered 2014-06-16: 180 mg via INTRAVENOUS
  Filled 2014-06-16: qty 9

## 2014-06-16 NOTE — Patient Instructions (Signed)
[facility] Discharge Instructions for Patients Receiving Chemotherapy  Today you received the following chemotherapy agents: Etoposide  To help prevent nausea and vomiting after your treatment, we encourage you to take your nausea medication as prescribed by your physician.    If you develop nausea and vomiting that is not controlled by your nausea medication, call the clinic.   BELOW ARE SYMPTOMS THAT SHOULD BE REPORTED IMMEDIATELY:  *FEVER GREATER THAN 100.5 F  *CHILLS WITH OR WITHOUT FEVER  NAUSEA AND VOMITING THAT IS NOT CONTROLLED WITH YOUR NAUSEA MEDICATION  *UNUSUAL SHORTNESS OF BREATH  *UNUSUAL BRUISING OR BLEEDING  TENDERNESS IN MOUTH AND THROAT WITH OR WITHOUT PRESENCE OF ULCERS  *URINARY PROBLEMS  *BOWEL PROBLEMS  UNUSUAL RASH Items with * indicate a potential emergency and should be followed up as soon as possible.  Feel free to call the clinic you have any questions or concerns. The clinic phone number is [phone].

## 2014-06-17 ENCOUNTER — Ambulatory Visit (HOSPITAL_BASED_OUTPATIENT_CLINIC_OR_DEPARTMENT_OTHER): Payer: Medicaid Other

## 2014-06-17 ENCOUNTER — Telehealth: Payer: Self-pay | Admitting: Nurse Practitioner

## 2014-06-17 VITALS — BP 96/48 | HR 82 | Temp 98.0°F | Resp 18

## 2014-06-17 DIAGNOSIS — C786 Secondary malignant neoplasm of retroperitoneum and peritoneum: Secondary | ICD-10-CM

## 2014-06-17 DIAGNOSIS — C3490 Malignant neoplasm of unspecified part of unspecified bronchus or lung: Secondary | ICD-10-CM

## 2014-06-17 DIAGNOSIS — C3410 Malignant neoplasm of upper lobe, unspecified bronchus or lung: Secondary | ICD-10-CM

## 2014-06-17 DIAGNOSIS — Z5111 Encounter for antineoplastic chemotherapy: Secondary | ICD-10-CM

## 2014-06-17 DIAGNOSIS — C7931 Secondary malignant neoplasm of brain: Secondary | ICD-10-CM

## 2014-06-17 DIAGNOSIS — C7949 Secondary malignant neoplasm of other parts of nervous system: Secondary | ICD-10-CM

## 2014-06-17 MED ORDER — SODIUM CHLORIDE 0.9 % IV SOLN
Freq: Once | INTRAVENOUS | Status: AC
  Administered 2014-06-17: 14:00:00 via INTRAVENOUS

## 2014-06-17 MED ORDER — PROCHLORPERAZINE MALEATE 10 MG PO TABS
10.0000 mg | ORAL_TABLET | Freq: Once | ORAL | Status: AC
  Administered 2014-06-17: 10 mg via ORAL

## 2014-06-17 MED ORDER — SODIUM CHLORIDE 0.9 % IV SOLN
100.0000 mg/m2 | Freq: Once | INTRAVENOUS | Status: AC
  Administered 2014-06-17: 180 mg via INTRAVENOUS
  Filled 2014-06-17: qty 9

## 2014-06-17 MED ORDER — PROCHLORPERAZINE MALEATE 10 MG PO TABS
ORAL_TABLET | ORAL | Status: AC
  Filled 2014-06-17: qty 1

## 2014-06-17 NOTE — Telephone Encounter (Signed)
per pof to sch appt-sent email to MW to coordinate trmt w/Dr appt-cld [place] [phone] billing manager wants pt to contact Social Worker-cld pt no answer no VM-will try again after trmt is changed

## 2014-06-17 NOTE — Patient Instructions (Signed)
[facility] Discharge Instructions for Patients Receiving Chemotherapy  Today you received the following chemotherapy agents VP-16 To help prevent nausea and vomiting after your treatment, we encourage you to take your nausea medication as prescribed. If you develop nausea and vomiting that is not controlled by your nausea medication, call the clinic.   BELOW ARE SYMPTOMS THAT SHOULD BE REPORTED IMMEDIATELY:  *FEVER GREATER THAN 100.5 F  *CHILLS WITH OR WITHOUT FEVER  NAUSEA AND VOMITING THAT IS NOT CONTROLLED WITH YOUR NAUSEA MEDICATION  *UNUSUAL SHORTNESS OF BREATH  *UNUSUAL BRUISING OR BLEEDING  TENDERNESS IN MOUTH AND THROAT WITH OR WITHOUT PRESENCE OF ULCERS  *URINARY PROBLEMS  *BOWEL PROBLEMS  UNUSUAL RASH Items with * indicate a potential emergency and should be followed up as soon as possible.  Feel free to call the clinic you have any questions or concerns. The clinic phone number is [phone].

## 2014-06-19 ENCOUNTER — Ambulatory Visit (HOSPITAL_BASED_OUTPATIENT_CLINIC_OR_DEPARTMENT_OTHER): Payer: Medicaid Other

## 2014-06-19 VITALS — BP 96/68 | HR 92 | Temp 98.1°F

## 2014-06-19 DIAGNOSIS — C3490 Malignant neoplasm of unspecified part of unspecified bronchus or lung: Secondary | ICD-10-CM

## 2014-06-19 DIAGNOSIS — C3410 Malignant neoplasm of upper lobe, unspecified bronchus or lung: Secondary | ICD-10-CM

## 2014-06-19 DIAGNOSIS — Z5189 Encounter for other specified aftercare: Secondary | ICD-10-CM

## 2014-06-19 MED ORDER — PEGFILGRASTIM INJECTION 6 MG/0.6ML
6.0000 mg | Freq: Once | SUBCUTANEOUS | Status: AC
  Administered 2014-06-19: 6 mg via SUBCUTANEOUS

## 2014-06-19 NOTE — Patient Instructions (Signed)

## 2014-06-21 ENCOUNTER — Telehealth: Payer: Self-pay | Admitting: *Deleted

## 2014-06-21 ENCOUNTER — Other Ambulatory Visit (HOSPITAL_BASED_OUTPATIENT_CLINIC_OR_DEPARTMENT_OTHER): Payer: Medicaid Other

## 2014-06-21 DIAGNOSIS — C3410 Malignant neoplasm of upper lobe, unspecified bronchus or lung: Secondary | ICD-10-CM

## 2014-06-21 DIAGNOSIS — C3490 Malignant neoplasm of unspecified part of unspecified bronchus or lung: Secondary | ICD-10-CM

## 2014-06-21 LAB — CBC WITH DIFFERENTIAL/PLATELET
BASO%: 0.4 % (ref 0.0–2.0)
Basophils Absolute: 0.1 10*3/uL (ref 0.0–0.1)
EOS ABS: 0.1 10*3/uL (ref 0.0–0.5)
EOS%: 0.2 % (ref 0.0–7.0)
HCT: 32.5 % — ABNORMAL LOW (ref 38.4–49.9)
HGB: 11 g/dL — ABNORMAL LOW (ref 13.0–17.1)
LYMPH#: 0.8 10*3/uL — AB (ref 0.9–3.3)
LYMPH%: 3 % — ABNORMAL LOW (ref 14.0–49.0)
MCH: 30.6 pg (ref 27.2–33.4)
MCHC: 33.8 g/dL (ref 32.0–36.0)
MCV: 90.3 fL (ref 79.3–98.0)
MONO#: 0.2 10*3/uL (ref 0.1–0.9)
MONO%: 0.8 % (ref 0.0–14.0)
NEUT#: 26 10*3/uL — ABNORMAL HIGH (ref 1.5–6.5)
NEUT%: 95.6 % — ABNORMAL HIGH (ref 39.0–75.0)
Platelets: 115 10*3/uL — ABNORMAL LOW (ref 140–400)
RBC: 3.6 10*6/uL — AB (ref 4.20–5.82)
RDW: 16.8 % — AB (ref 11.0–14.6)
WBC: 27.2 10*3/uL — ABNORMAL HIGH (ref 4.0–10.3)

## 2014-06-21 LAB — COMPREHENSIVE METABOLIC PANEL (CC13)
ALBUMIN: 3.6 g/dL (ref 3.5–5.0)
ALT: 28 U/L (ref 0–55)
AST: 25 U/L (ref 5–34)
Alkaline Phosphatase: 154 U/L — ABNORMAL HIGH (ref 40–150)
Anion Gap: 7 mEq/L (ref 3–11)
BUN: 31.3 mg/dL — ABNORMAL HIGH (ref 7.0–26.0)
CHLORIDE: 105 meq/L (ref 98–109)
CO2: 26 mEq/L (ref 22–29)
Calcium: 10 mg/dL (ref 8.4–10.4)
Creatinine: 1.1 mg/dL (ref 0.7–1.3)
GLUCOSE: 88 mg/dL (ref 70–140)
POTASSIUM: 4 meq/L (ref 3.5–5.1)
Sodium: 138 mEq/L (ref 136–145)
TOTAL PROTEIN: 6.9 g/dL (ref 6.4–8.3)
Total Bilirubin: 0.57 mg/dL (ref 0.20–1.20)

## 2014-06-21 NOTE — Telephone Encounter (Signed)
I have adjusted 7/21 appt

## 2014-06-22 ENCOUNTER — Telehealth: Payer: Self-pay | Admitting: *Deleted

## 2014-06-22 NOTE — Telephone Encounter (Signed)
Message from [place] with [facility] reporting they have not been able to get in contact with pt for appt. Please have pt contact them at [phone] #[phone].  Attempted to call pt, no answer.

## 2014-06-23 NOTE — Telephone Encounter (Addendum)
Called and left message with instructions to call [facility], and to call Dr [facility] nurse back to confirm he received the message.  Attempted to call home #, line was busy.  SLJ

## 2014-06-28 ENCOUNTER — Ambulatory Visit: Payer: Medicaid Other

## 2014-06-28 ENCOUNTER — Other Ambulatory Visit (HOSPITAL_BASED_OUTPATIENT_CLINIC_OR_DEPARTMENT_OTHER): Payer: Medicaid Other

## 2014-06-28 DIAGNOSIS — C7949 Secondary malignant neoplasm of other parts of nervous system: Secondary | ICD-10-CM

## 2014-06-28 DIAGNOSIS — C3410 Malignant neoplasm of upper lobe, unspecified bronchus or lung: Secondary | ICD-10-CM

## 2014-06-28 DIAGNOSIS — C7931 Secondary malignant neoplasm of brain: Secondary | ICD-10-CM

## 2014-06-28 DIAGNOSIS — C3490 Malignant neoplasm of unspecified part of unspecified bronchus or lung: Secondary | ICD-10-CM

## 2014-06-28 LAB — CBC WITH DIFFERENTIAL/PLATELET
BASO%: 0.3 % (ref 0.0–2.0)
Basophils Absolute: 0 10*3/uL (ref 0.0–0.1)
EOS%: 0.9 % (ref 0.0–7.0)
Eosinophils Absolute: 0.1 10*3/uL (ref 0.0–0.5)
HCT: 29.8 % — ABNORMAL LOW (ref 38.4–49.9)
HGB: 10.1 g/dL — ABNORMAL LOW (ref 13.0–17.1)
LYMPH%: 11.6 % — AB (ref 14.0–49.0)
MCH: 30.5 pg (ref 27.2–33.4)
MCHC: 33.9 g/dL (ref 32.0–36.0)
MCV: 90 fL (ref 79.3–98.0)
MONO#: 1 10*3/uL — AB (ref 0.1–0.9)
MONO%: 10.3 % (ref 0.0–14.0)
NEUT#: 7.2 10*3/uL — ABNORMAL HIGH (ref 1.5–6.5)
NEUT%: 76.9 % — ABNORMAL HIGH (ref 39.0–75.0)
Platelets: 42 10*3/uL — ABNORMAL LOW (ref 140–400)
RBC: 3.31 10*6/uL — AB (ref 4.20–5.82)
RDW: 16.3 % — ABNORMAL HIGH (ref 11.0–14.6)
WBC: 9.4 10*3/uL (ref 4.0–10.3)
lymph#: 1.1 10*3/uL (ref 0.9–3.3)

## 2014-06-28 LAB — COMPREHENSIVE METABOLIC PANEL (CC13)
ALT: 14 U/L (ref 0–55)
AST: 14 U/L (ref 5–34)
Albumin: 3.4 g/dL — ABNORMAL LOW (ref 3.5–5.0)
Alkaline Phosphatase: 146 U/L (ref 40–150)
Anion Gap: 8 mEq/L (ref 3–11)
BUN: 15.7 mg/dL (ref 7.0–26.0)
CO2: 25 mEq/L (ref 22–29)
Calcium: 9.4 mg/dL (ref 8.4–10.4)
Chloride: 109 mEq/L (ref 98–109)
Creatinine: 1 mg/dL (ref 0.7–1.3)
Glucose: 106 mg/dl (ref 70–140)
Potassium: 3.7 mEq/L (ref 3.5–5.1)
Sodium: 141 mEq/L (ref 136–145)
TOTAL PROTEIN: 6.4 g/dL (ref 6.4–8.3)

## 2014-06-29 ENCOUNTER — Ambulatory Visit: Payer: Medicaid Other

## 2014-06-30 ENCOUNTER — Ambulatory Visit: Payer: Medicaid Other

## 2014-07-01 ENCOUNTER — Ambulatory Visit: Payer: Medicaid Other

## 2014-07-05 ENCOUNTER — Encounter (HOSPITAL_COMMUNITY): Payer: Self-pay

## 2014-07-05 ENCOUNTER — Other Ambulatory Visit (HOSPITAL_BASED_OUTPATIENT_CLINIC_OR_DEPARTMENT_OTHER): Payer: Medicaid Other

## 2014-07-05 ENCOUNTER — Ambulatory Visit (HOSPITAL_COMMUNITY)
Admission: RE | Admit: 2014-07-05 | Discharge: 2014-07-05 | Disposition: A | Discharge status: Home / Self Care | Payer: Medicaid Other | Source: Ambulatory Visit | Attending: Nurse Practitioner | Admitting: Nurse Practitioner

## 2014-07-05 DIAGNOSIS — C787 Secondary malignant neoplasm of liver and intrahepatic bile duct: Secondary | ICD-10-CM | POA: Insufficient documentation

## 2014-07-05 DIAGNOSIS — N289 Disorder of kidney and ureter, unspecified: Secondary | ICD-10-CM | POA: Diagnosis not present

## 2014-07-05 DIAGNOSIS — J438 Other emphysema: Secondary | ICD-10-CM | POA: Diagnosis not present

## 2014-07-05 DIAGNOSIS — N1330 Unspecified hydronephrosis: Secondary | ICD-10-CM | POA: Insufficient documentation

## 2014-07-05 DIAGNOSIS — K7689 Other specified diseases of liver: Secondary | ICD-10-CM | POA: Diagnosis not present

## 2014-07-05 DIAGNOSIS — J8410 Pulmonary fibrosis, unspecified: Secondary | ICD-10-CM | POA: Diagnosis not present

## 2014-07-05 DIAGNOSIS — C3490 Malignant neoplasm of unspecified part of unspecified bronchus or lung: Secondary | ICD-10-CM

## 2014-07-05 DIAGNOSIS — C3410 Malignant neoplasm of upper lobe, unspecified bronchus or lung: Secondary | ICD-10-CM

## 2014-07-05 DIAGNOSIS — C7952 Secondary malignant neoplasm of bone marrow: Secondary | ICD-10-CM

## 2014-07-05 DIAGNOSIS — C7951 Secondary malignant neoplasm of bone: Secondary | ICD-10-CM

## 2014-07-05 LAB — CBC WITH DIFFERENTIAL/PLATELET
BASO%: 0.3 % (ref 0.0–2.0)
Basophils Absolute: 0 10*3/uL (ref 0.0–0.1)
EOS%: 0.2 % (ref 0.0–7.0)
Eosinophils Absolute: 0 10*3/uL (ref 0.0–0.5)
HCT: 28.9 % — ABNORMAL LOW (ref 38.4–49.9)
HGB: 9.5 g/dL — ABNORMAL LOW (ref 13.0–17.1)
LYMPH%: 7.2 % — AB (ref 14.0–49.0)
MCH: 30.2 pg (ref 27.2–33.4)
MCHC: 32.9 g/dL (ref 32.0–36.0)
MCV: 91.6 fL (ref 79.3–98.0)
MONO#: 0.9 10*3/uL (ref 0.1–0.9)
MONO%: 8.1 % (ref 0.0–14.0)
NEUT#: 9.7 10*3/uL — ABNORMAL HIGH (ref 1.5–6.5)
NEUT%: 84.2 % — ABNORMAL HIGH (ref 39.0–75.0)
Platelets: 113 10*3/uL — ABNORMAL LOW (ref 140–400)
RBC: 3.15 10*6/uL — ABNORMAL LOW (ref 4.20–5.82)
RDW: 18.9 % — ABNORMAL HIGH (ref 11.0–14.6)
WBC: 11.6 10*3/uL — ABNORMAL HIGH (ref 4.0–10.3)
lymph#: 0.8 10*3/uL — ABNORMAL LOW (ref 0.9–3.3)

## 2014-07-05 LAB — COMPREHENSIVE METABOLIC PANEL (CC13)
ALT: 12 U/L (ref 0–55)
ANION GAP: 8 meq/L (ref 3–11)
AST: 25 U/L (ref 5–34)
Albumin: 3.6 g/dL (ref 3.5–5.0)
Alkaline Phosphatase: 147 U/L (ref 40–150)
BILIRUBIN TOTAL: 0.29 mg/dL (ref 0.20–1.20)
BUN: 18.7 mg/dL (ref 7.0–26.0)
CO2: 29 meq/L (ref 22–29)
CREATININE: 1.2 mg/dL (ref 0.7–1.3)
Calcium: 9.8 mg/dL (ref 8.4–10.4)
Chloride: 107 mEq/L (ref 98–109)
Glucose: 81 mg/dl (ref 70–140)
Potassium: 3.4 mEq/L — ABNORMAL LOW (ref 3.5–5.1)
Sodium: 144 mEq/L (ref 136–145)
Total Protein: 6.7 g/dL (ref 6.4–8.3)

## 2014-07-05 MED ORDER — IOHEXOL 300 MG/ML  SOLN
50.0000 mL | Freq: Once | INTRAMUSCULAR | Status: AC | PRN
  Administered 2014-07-05: 50 mL via ORAL

## 2014-07-06 ENCOUNTER — Ambulatory Visit (HOSPITAL_BASED_OUTPATIENT_CLINIC_OR_DEPARTMENT_OTHER): Payer: Medicaid Other | Admitting: Physician Assistant

## 2014-07-06 ENCOUNTER — Ambulatory Visit (HOSPITAL_BASED_OUTPATIENT_CLINIC_OR_DEPARTMENT_OTHER): Payer: Medicaid Other

## 2014-07-06 ENCOUNTER — Telehealth: Payer: Self-pay | Admitting: Internal Medicine

## 2014-07-06 ENCOUNTER — Other Ambulatory Visit: Payer: Self-pay | Admitting: Physician Assistant

## 2014-07-06 VITALS — BP 112/64 | HR 80 | Temp 98.0°F | Resp 18 | Ht 70.0 in | Wt 140.4 lb

## 2014-07-06 DIAGNOSIS — C3490 Malignant neoplasm of unspecified part of unspecified bronchus or lung: Secondary | ICD-10-CM

## 2014-07-06 DIAGNOSIS — C7931 Secondary malignant neoplasm of brain: Secondary | ICD-10-CM

## 2014-07-06 DIAGNOSIS — C3410 Malignant neoplasm of upper lobe, unspecified bronchus or lung: Secondary | ICD-10-CM

## 2014-07-06 DIAGNOSIS — C7949 Secondary malignant neoplasm of other parts of nervous system: Secondary | ICD-10-CM

## 2014-07-06 DIAGNOSIS — Z5111 Encounter for antineoplastic chemotherapy: Secondary | ICD-10-CM

## 2014-07-06 MED ORDER — PROMETHAZINE HCL 25 MG PO TABS: 25.0000 mg | ORAL_TABLET | Freq: Four times a day (QID) | ORAL | Status: DC | PRN

## 2014-07-06 MED ORDER — DEXAMETHASONE SODIUM PHOSPHATE 20 MG/5ML IJ SOLN
INTRAMUSCULAR | Status: AC
  Filled 2014-07-06: qty 5

## 2014-07-06 MED ORDER — CARBOPLATIN CHEMO INTRADERMAL TEST DOSE 100MCG/0.02ML
100.0000 ug | Freq: Once | INTRADERMAL | Status: AC
  Administered 2014-07-06: 100 ug via INTRADERMAL
  Filled 2014-07-06: qty 0.01

## 2014-07-06 MED ORDER — SODIUM CHLORIDE 0.9 % IV SOLN
Freq: Once | INTRAVENOUS | Status: AC
  Administered 2014-07-06: 16:00:00 via INTRAVENOUS

## 2014-07-06 MED ORDER — SODIUM CHLORIDE 0.9 % IV SOLN
100.0000 mg/m2 | Freq: Once | INTRAVENOUS | Status: AC
  Administered 2014-07-06: 180 mg via INTRAVENOUS
  Filled 2014-07-06: qty 9

## 2014-07-06 MED ORDER — HYDROCODONE-ACETAMINOPHEN 5-325 MG PO TABS: ORAL_TABLET | ORAL | Status: DC

## 2014-07-06 MED ORDER — SODIUM CHLORIDE 0.9 % IV SOLN
430.0000 mg | Freq: Once | INTRAVENOUS | Status: AC
  Administered 2014-07-06: 430 mg via INTRAVENOUS
  Filled 2014-07-06: qty 43

## 2014-07-06 MED ORDER — ONDANSETRON 16 MG/50ML IVPB (CHCC)
16.0000 mg | Freq: Once | INTRAVENOUS | Status: AC
  Administered 2014-07-06: 16 mg via INTRAVENOUS

## 2014-07-06 MED ORDER — DEXAMETHASONE SODIUM PHOSPHATE 20 MG/5ML IJ SOLN
20.0000 mg | Freq: Once | INTRAMUSCULAR | Status: AC
  Administered 2014-07-06: 20 mg via INTRAVENOUS

## 2014-07-06 NOTE — Telephone Encounter (Signed)
gv andprinted appt sched and av sfo rpt fo rJUly adn Aug....sed added tx.

## 2014-07-06 NOTE — Patient Instructions (Signed)
[facility] Discharge Instructions for Patients Receiving Chemotherapy  Today you received the following chemotherapy agents :  Carboplatin,  Etoposide.  To help prevent nausea and vomiting after your treatment, we encourage you to take your nausea medication as prescribed by your physician.   If you develop nausea and vomiting that is not controlled by your nausea medication, call the clinic.   BELOW ARE SYMPTOMS THAT SHOULD BE REPORTED IMMEDIATELY:  *FEVER GREATER THAN 100.5 F  *CHILLS WITH OR WITHOUT FEVER  NAUSEA AND VOMITING THAT IS NOT CONTROLLED WITH YOUR NAUSEA MEDICATION  *UNUSUAL SHORTNESS OF BREATH  *UNUSUAL BRUISING OR BLEEDING  TENDERNESS IN MOUTH AND THROAT WITH OR WITHOUT PRESENCE OF ULCERS  *URINARY PROBLEMS  *BOWEL PROBLEMS  UNUSUAL RASH Items with * indicate a potential emergency and should be followed up as soon as possible.  Feel free to call the clinic you have any questions or concerns. The clinic phone number is [phone].

## 2014-07-06 NOTE — Progress Notes (Signed)
1800 -  Pt complained of pain on right side hip.  Pt asked for pain med. Pt drives self today.  Instructed pt that pain med can be given ; however, pt will need to have someone drive him home after chemo treatment.  Pt stated " No, I can wait until I get home ".  Offered pt several times that pt can have pain meds but pt will need driver to take pt home.  Pt refused each time.  Pt was resting comfortably during chemo without signs of distress noted.

## 2014-07-07 ENCOUNTER — Ambulatory Visit (HOSPITAL_BASED_OUTPATIENT_CLINIC_OR_DEPARTMENT_OTHER): Payer: Medicaid Other

## 2014-07-07 VITALS — BP 90/54 | HR 79 | Temp 97.4°F | Resp 16

## 2014-07-07 DIAGNOSIS — Z5111 Encounter for antineoplastic chemotherapy: Secondary | ICD-10-CM

## 2014-07-07 DIAGNOSIS — C3410 Malignant neoplasm of upper lobe, unspecified bronchus or lung: Secondary | ICD-10-CM

## 2014-07-07 DIAGNOSIS — C7949 Secondary malignant neoplasm of other parts of nervous system: Secondary | ICD-10-CM

## 2014-07-07 DIAGNOSIS — C3490 Malignant neoplasm of unspecified part of unspecified bronchus or lung: Secondary | ICD-10-CM

## 2014-07-07 DIAGNOSIS — C7931 Secondary malignant neoplasm of brain: Secondary | ICD-10-CM

## 2014-07-07 MED ORDER — PROCHLORPERAZINE MALEATE 10 MG PO TABS
10.0000 mg | ORAL_TABLET | Freq: Once | ORAL | Status: AC
  Administered 2014-07-07: 10 mg via ORAL

## 2014-07-07 MED ORDER — SODIUM CHLORIDE 0.9 % IV SOLN
Freq: Once | INTRAVENOUS | Status: AC
  Administered 2014-07-07: 14:00:00 via INTRAVENOUS

## 2014-07-07 MED ORDER — PROCHLORPERAZINE MALEATE 10 MG PO TABS
ORAL_TABLET | ORAL | Status: AC
  Filled 2014-07-07: qty 1

## 2014-07-07 MED ORDER — SODIUM CHLORIDE 0.9 % IV SOLN
100.0000 mg/m2 | Freq: Once | INTRAVENOUS | Status: AC
  Administered 2014-07-07: 180 mg via INTRAVENOUS
  Filled 2014-07-07: qty 9

## 2014-07-07 NOTE — Patient Instructions (Signed)
[facility] Discharge Instructions for Patients Receiving Chemotherapy  Today you received the following chemotherapy agents etoposide  To help prevent nausea and vomiting after your treatment, we encourage you to take your nausea medication if needed.   If you develop nausea and vomiting that is not controlled by your nausea medication, call the clinic.   BELOW ARE SYMPTOMS THAT SHOULD BE REPORTED IMMEDIATELY:  *FEVER GREATER THAN 100.5 F  *CHILLS WITH OR WITHOUT FEVER  NAUSEA AND VOMITING THAT IS NOT CONTROLLED WITH YOUR NAUSEA MEDICATION  *UNUSUAL SHORTNESS OF BREATH  *UNUSUAL BRUISING OR BLEEDING  TENDERNESS IN MOUTH AND THROAT WITH OR WITHOUT PRESENCE OF ULCERS  *URINARY PROBLEMS  *BOWEL PROBLEMS  UNUSUAL RASH Items with * indicate a potential emergency and should be followed up as soon as possible.  Feel free to call the clinic you have any questions or concerns. The clinic phone number is [phone].

## 2014-07-08 ENCOUNTER — Ambulatory Visit (HOSPITAL_BASED_OUTPATIENT_CLINIC_OR_DEPARTMENT_OTHER): Payer: Medicaid Other

## 2014-07-08 VITALS — BP 119/66 | HR 66 | Temp 97.8°F | Resp 16

## 2014-07-08 DIAGNOSIS — Z5111 Encounter for antineoplastic chemotherapy: Secondary | ICD-10-CM

## 2014-07-08 DIAGNOSIS — C7949 Secondary malignant neoplasm of other parts of nervous system: Secondary | ICD-10-CM

## 2014-07-08 DIAGNOSIS — C3410 Malignant neoplasm of upper lobe, unspecified bronchus or lung: Secondary | ICD-10-CM

## 2014-07-08 DIAGNOSIS — C7931 Secondary malignant neoplasm of brain: Secondary | ICD-10-CM

## 2014-07-08 DIAGNOSIS — C3490 Malignant neoplasm of unspecified part of unspecified bronchus or lung: Secondary | ICD-10-CM

## 2014-07-08 MED ORDER — PROCHLORPERAZINE MALEATE 10 MG PO TABS
10.0000 mg | ORAL_TABLET | Freq: Once | ORAL | Status: AC
  Administered 2014-07-08: 10 mg via ORAL

## 2014-07-08 MED ORDER — SODIUM CHLORIDE 0.9 % IV SOLN
Freq: Once | INTRAVENOUS | Status: AC
  Administered 2014-07-08: 14:00:00 via INTRAVENOUS

## 2014-07-08 MED ORDER — SODIUM CHLORIDE 0.9 % IV SOLN
100.0000 mg/m2 | Freq: Once | INTRAVENOUS | Status: AC
  Administered 2014-07-08: 180 mg via INTRAVENOUS
  Filled 2014-07-08: qty 9

## 2014-07-08 MED ORDER — PROCHLORPERAZINE MALEATE 10 MG PO TABS
ORAL_TABLET | ORAL | Status: AC
  Filled 2014-07-08: qty 1

## 2014-07-08 NOTE — Patient Instructions (Signed)
[facility] Discharge Instructions for Patients Receiving Chemotherapy  Today you received the following chemotherapy agents etoposide.  To help prevent nausea and vomiting after your treatment, we encourage you to take your nausea medication phenergan.   If you develop nausea and vomiting that is not controlled by your nausea medication, call the clinic.   BELOW ARE SYMPTOMS THAT SHOULD BE REPORTED IMMEDIATELY:  *FEVER GREATER THAN 100.5 F  *CHILLS WITH OR WITHOUT FEVER  NAUSEA AND VOMITING THAT IS NOT CONTROLLED WITH YOUR NAUSEA MEDICATION  *UNUSUAL SHORTNESS OF BREATH  *UNUSUAL BRUISING OR BLEEDING  TENDERNESS IN MOUTH AND THROAT WITH OR WITHOUT PRESENCE OF ULCERS  *URINARY PROBLEMS  *BOWEL PROBLEMS  UNUSUAL RASH Items with * indicate a potential emergency and should be followed up as soon as possible.  Feel free to call the clinic you have any questions or concerns. The clinic phone number is [phone].

## 2014-07-08 NOTE — Patient Instructions (Signed)
Your CT scan revealed relatively stable disease Continue with labs and chemotherapy as scheduled Followup in 3 weeks

## 2014-07-08 NOTE — Progress Notes (Signed)
Patient escorted to front door by Royce Macadamia RN.  Patient did not use valet parking.  He did not want "anybody driving my car".  Secured security Lucianne Lei to take patient to his car.

## 2014-07-08 NOTE — Progress Notes (Addendum)
[facility] [phone]   [phone]  SHARED VISIT PROGRESS NOTE  No PCP Per Patient No address on file  DIAGNOSIS AND STAGE: Extensive stage small cell lung cancer diagnosed in May of 2014   PRIOR THERAPY:  1) whole Brain irradiation as well as palliative radiotherapy to the chest mass.  2) Systemic chemotherapy with carboplatin for AUC of 5 on day 1 and etoposide 120 mg/M2 on days 1, 2 and 3 with Neulasta support on day 4, status post 5 cycles. First cycle was started on 05/25/2013.  3) palliative radiotherapy to the abdomen metastasis adjacent to the right kidney under the care of Dr. Lisbeth Renshaw completed on 01/26/2014. 4) palliative radiotherapy to the left hip under the care of Dr. Lisbeth Renshaw.  CURRENT THERAPY: Second course of systemic chemotherapy with carboplatin for AUC of 5 on day 1 and etoposide 100 mg/M2 on days 1, 2 and 3 with Neulasta support on day 4. Status post 2 cycles.  CHEMOTHERAPY INTENT: Palliative  CURRENT # OF CHEMOTHERAPY CYCLES: 3 CURRENT ANTIEMETICS: Zofran, dexamethasone and Compazine  CURRENT SMOKING STATUS: Current smoker and strongly advised to quit smoking and offered smoke cessation program  ORAL CHEMOTHERAPY AND CONSENT: None  CURRENT BISPHOSPHONATES USE: None  PAIN MANAGEMENT: Well-managed with Percocet when necessary  NARCOTICS INDUCED CONSTIPATION: No constipation but has a stool softener at home  LIVING WILL AND CODE STATUS: Discussed with the patient and he is still Full code.   INTERVAL HISTORY: Jacob Webb 63 y.o. male returns to the clinic today for followup visit. The patient is feeling fine today with no specific complaints except for fatigue and intermittent abdominal pain. He denied having any significant chest pain but continues to have shortness of breath with exertion with no cough or hemoptysis. He denied having any fever or chills. His by mouth intake is been a bit decreased secondary to his social  situation. He reports that social services is to help him with transportation. The patient denied having any night sweats. He does complain of right side pain and right hip to right knee pain. This has been present for a few weeks but he notes it became a bit worse after "moving some things around" in his house.The patient had repeat CT scan of the chest, abdomen and pelvis performed recently and he is here for discussion of his scan results as well as to proceed with cycle #3 of his chemotherapy.  MEDICAL HISTORY: Past Medical History  Diagnosis Date  . Cancer     lung ca dx'd 04/2013  . Cancer 11/30/13 ct    rt para renal mass metastatic  . History of radiation therapy 01/07/14-01/26/14    abdomen/30 Gy/17fx  . Hx of radiation therapy 04/05/14-04/09/14    palliative left femur, 20Gy    ALLERGIES:  has No Known Allergies.  MEDICATIONS:  Current Outpatient Prescriptions  Medication Sig Dispense Refill  . omeprazole (PRILOSEC) 40 MG capsule Take 40 mg by mouth daily.      Marland Kitchen HYDROcodone-acetaminophen (NORCO/VICODIN) 5-325 MG per tablet Take 1 tablet by mouth every 4 to 6 hours as needed for pain  60 tablet  0  . oxyCODONE-acetaminophen (PERCOCET/ROXICET) 5-325 MG per tablet Take 1-2 tablets by mouth every 6 (six) hours as needed for severe pain.  50 tablet  0  . promethazine (PHENERGAN) 25 MG tablet Take 1 tablet (25 mg total) by mouth every 6 (six) hours as needed for nausea.  30 tablet  0  No current facility-administered medications for this visit.   REVIEW OF SYSTEMS:  Constitutional: positive for fatigue Eyes: negative Ears, nose, mouth, throat, and face: negative Respiratory: positive for dyspnea on exertion Cardiovascular: negative Gastrointestinal: negative Genitourinary:negative Integument/breast: negative Hematologic/lymphatic: negative Musculoskeletal:negative Neurological: negative Behavioral/Psych: negative Endocrine: negative Allergic/Immunologic: negative   PHYSICAL  EXAMINATION: General appearance: alert, cooperative, fatigued and no distress Head: Normocephalic, without obvious abnormality, atraumatic Neck: no adenopathy Lymph nodes: Cervical, supraclavicular, and axillary nodes normal. Resp: clear to auscultation bilaterally Back: symmetric, no curvature. ROM normal. No CVA tenderness. Cardio: regular rate and rhythm, S1, S2 normal, no murmur, click, rub or gallop GI: soft, non-tender; bowel sounds normal; no masses,  no organomegaly Extremities: extremities normal, atraumatic, no cyanosis or edema Neurologic: Alert and oriented X 3, normal strength and tone. Normal symmetric reflexes. Normal coordination and gait  ECOG PERFORMANCE STATUS: 1 - Symptomatic but completely ambulatory  Blood pressure 112/64, pulse 80, temperature 98 F (36.7 C), temperature source Oral, resp. rate 18, height 5\' 10"  (1.778 m), weight 140 lb 6.4 oz (63.685 kg), SpO2 100.00%.  LABORATORY DATA: Lab Results  Component Value Date   WBC 11.6* 07/05/2014   HGB 9.5* 07/05/2014   HCT 28.9* 07/05/2014   MCV 91.6 07/05/2014   PLT 113* 07/05/2014      Chemistry      Component Value Date/Time   NA 144 07/05/2014 1115   NA 138 09/13/2013 0815   K 3.4* 07/05/2014 1115   K 3.7 09/13/2013 0815   CL 104 09/13/2013 0815   CL 105 06/08/2013 1009   CO2 29 07/05/2014 1115   CO2 26 09/13/2013 0815   BUN 18.7 07/05/2014 1115   BUN 9 09/13/2013 0815   CREATININE 1.2 07/05/2014 1115   CREATININE 0.82 09/13/2013 0815      Component Value Date/Time   CALCIUM 9.8 07/05/2014 1115   CALCIUM 8.8 09/13/2013 0815   ALKPHOS 147 07/05/2014 1115   ALKPHOS 109 09/11/2013 1335   AST 25 07/05/2014 1115   AST 14 09/11/2013 1335   ALT 12 07/05/2014 1115   ALT 16 09/11/2013 1335   BILITOT 0.29 07/05/2014 1115   BILITOT 0.6 09/11/2013 1335       RADIOGRAPHIC STUDIES:  Ct Abdomen Pelvis Wo Contrast  07/05/2014   CLINICAL DATA:  Restaging small cell lung cancer. Diagnosis marked 2014. Ongoing chemotherapy.   EXAM: CT CHEST, ABDOMEN AND PELVIS WITHOUT CONTRAST  TECHNIQUE: Multidetector CT imaging of the chest, abdomen and pelvis was performed following the standard protocol without IV contrast.  COMPARISON:  CT 07/05/2014.  FINDINGS: CT CHEST FINDINGS  There is no axillary or supraclavicular lymphadenopathy. No mediastinal adenopathy on this noncontrast exam. There is fullness within the left suprahilar location (image 21, series 2) not changed from prior No pericardial fluid. Esophagus is normal.  Review of the lung parenchyma demonstrates severe centrilobular emphysema in the upper lobes. There is additionally linear fibrotic pattern in the left upper lobe suggesting radiation change. Stable pleural parenchymal nodular thickening at the lung apices. No new nodularity.  CT ABDOMEN AND PELVIS FINDINGS  On this noncontrast exam, there is again demonstrated ill-defined lesion inferior to the right hepatic lobe measuring approximately 10 mm not changed from prior. Within the right perirenal space there are 2 metastatic nodules again demonstrated. Lesion along along the lateral margin of right kidney measures 21 x 18 mm compared to 28 by 16 mm. This nodule visually appears increased in size. Nodule superior to the upper pole of the right  kidney measures 28 x 25 mm increased from 24 x 27 mm on prior. There is hydronephrosis of the right kidney secondary to an obstructing lesion along the course of the right ureter measuring 18 x 23 mm (79, series 2). This nodule is unchanged from 27 x 17 mm on prior. Hydronephrosis appears slightly increased.  Small nodule measuring 8 mm in the left perirenal space not changed (image 66, series 2)  The gallbladder, pancreas, spleen, adrenal glands, and left kidney are normal.  The stomach, small bowel, and colon are unremarkable.  Abdominal aorta is normal caliber. No retroperitoneal periportal lymphadenopathy.  Prostate gland and bladder normal. No pelvic lymphadenopathy. No aggressive  osseous lesion.  IMPRESSION: Chest Impression:  1. No evidence of local carcinoma recurrence or metastasis in the rocks. 2. Stable fullness in the left hilum.  Abdomen / Pelvis Impression:  1. Persistent nodular metastasis in the right pararenal space. These nodules may be slightly increased. 2. Nodule along the course of the right ureter obstructs the right ureter with mild increase increase in moderate hydronephrosis of the right kidney. 3. Concern for a subtle right hepatic lobe metastasis.   Electronically Signed   By: Suzy Bouchard M.D.   On: 07/05/2014 14:44   Ct Chest Wo Contrast  07/05/2014   CLINICAL DATA:  Restaging small cell lung cancer. Diagnosis marked 2014. Ongoing chemotherapy.  EXAM: CT CHEST, ABDOMEN AND PELVIS WITHOUT CONTRAST  TECHNIQUE: Multidetector CT imaging of the chest, abdomen and pelvis was performed following the standard protocol without IV contrast.  COMPARISON:  CT 07/05/2014.  FINDINGS: CT CHEST FINDINGS  There is no axillary or supraclavicular lymphadenopathy. No mediastinal adenopathy on this noncontrast exam. There is fullness within the left suprahilar location (image 21, series 2) not changed from prior No pericardial fluid. Esophagus is normal.  Review of the lung parenchyma demonstrates severe centrilobular emphysema in the upper lobes. There is additionally linear fibrotic pattern in the left upper lobe suggesting radiation change. Stable pleural parenchymal nodular thickening at the lung apices. No new nodularity.  CT ABDOMEN AND PELVIS FINDINGS  On this noncontrast exam, there is again demonstrated ill-defined lesion inferior to the right hepatic lobe measuring approximately 10 mm not changed from prior. Within the right perirenal space there are 2 metastatic nodules again demonstrated. Lesion along along the lateral margin of right kidney measures 21 x 18 mm compared to 28 by 16 mm. This nodule visually appears increased in size. Nodule superior to the upper pole of  the right kidney measures 28 x 25 mm increased from 24 x 27 mm on prior. There is hydronephrosis of the right kidney secondary to an obstructing lesion along the course of the right ureter measuring 18 x 23 mm (79, series 2). This nodule is unchanged from 27 x 17 mm on prior. Hydronephrosis appears slightly increased.  Small nodule measuring 8 mm in the left perirenal space not changed (image 66, series 2)  The gallbladder, pancreas, spleen, adrenal glands, and left kidney are normal.  The stomach, small bowel, and colon are unremarkable.  Abdominal aorta is normal caliber. No retroperitoneal periportal lymphadenopathy.  Prostate gland and bladder normal. No pelvic lymphadenopathy. No aggressive osseous lesion.  IMPRESSION: Chest Impression:  1. No evidence of local carcinoma recurrence or metastasis in the rocks. 2. Stable fullness in the left hilum.  Abdomen / Pelvis Impression:  1. Persistent nodular metastasis in the right pararenal space. These nodules may be slightly increased. 2. Nodule along the course of the  right ureter obstructs the right ureter with mild increase increase in moderate hydronephrosis of the right kidney. 3. Concern for a subtle right hepatic lobe metastasis.   Electronically Signed   By: Suzy Bouchard M.D.   On: 07/05/2014 14:44   ASSESSMENT AND PLAN:  This is a very pleasant 63 years old white male with extensive stage small cell lung cancer: he completed 5 cycles of systemic chemotherapy with carboplatin and etoposide with significant improvement in his disease. His previous CT scan revealed evidence for disease progression and he was started on chemotherapy again with carboplatin and etoposide with Neulasta support. He is status post 2 cycles His recent CT scan showed no evidence of local recurrence or metastasis within the chest.there was stable fullness in the left hilum, persistent nodular metastasis in the right pararenal space. These nodules may be slightly increased.  The  CT scan results were discussed with the patient. Patient was discussed with an also seen by Dr. Julien Nordmann. He will proceed with cycle #3 of his systemic chemotherapy with carboplatin and etoposide with Neulasta support as scheduled. Patient requests a refill for his nausea medication and this was sent to the [facility]. He will continue with weekly labs as scheduled and return in 3 weeks prior to the start of cycle #4 another symptom management visit the He was advised to call immediately if he has any concerning symptoms in the interval.  The patient voices understanding of current disease status and treatment options and is in agreement with the current care plan.  All questions were answered. The patient knows to call the clinic with any problems, questions or concerns. We can certainly see the patient much sooner if necessary.  Carlton Adam PA-C  ADDENDUM: Hematology/Oncology Attending:  I had a face to face encounter with the patient. I recommended his care plan. This is a very pleasant 63 years old white male with recurrent small cell lung cancer and currently undergoing systemic chemotherapy with carboplatin and and etoposide status post 2 cycles. The patient related the first 2 cycles of his treatment fairly well with no significant adverse effect except for mild fatigue. His recent CT scan of the chest, abdomen and pelvis showed stable disease. I discussed the scan results with the patient today. I recommended for him to proceed cycle #3 as scheduled. He would come back for followup visit in 3 weeks with the next cycle of his treatment.  He was advised to call immediately if he has any concerning symptoms in the interval.  Disclaimer: This note was dictated with voice recognition software. Similar sounding words can inadvertently be transcribed and may not be corrected upon review. [name]., MD 07/11/2014

## 2014-07-09 ENCOUNTER — Ambulatory Visit (HOSPITAL_BASED_OUTPATIENT_CLINIC_OR_DEPARTMENT_OTHER): Payer: Medicaid Other

## 2014-07-09 VITALS — BP 99/66 | HR 77

## 2014-07-09 DIAGNOSIS — C3490 Malignant neoplasm of unspecified part of unspecified bronchus or lung: Secondary | ICD-10-CM

## 2014-07-09 DIAGNOSIS — Z5189 Encounter for other specified aftercare: Secondary | ICD-10-CM

## 2014-07-09 DIAGNOSIS — C7931 Secondary malignant neoplasm of brain: Secondary | ICD-10-CM

## 2014-07-09 DIAGNOSIS — C7949 Secondary malignant neoplasm of other parts of nervous system: Secondary | ICD-10-CM

## 2014-07-09 DIAGNOSIS — C3410 Malignant neoplasm of upper lobe, unspecified bronchus or lung: Secondary | ICD-10-CM

## 2014-07-09 MED ORDER — PEGFILGRASTIM INJECTION 6 MG/0.6ML
6.0000 mg | Freq: Once | SUBCUTANEOUS | Status: AC
  Administered 2014-07-09: 6 mg via SUBCUTANEOUS
  Filled 2014-07-09: qty 0.6

## 2014-07-13 ENCOUNTER — Other Ambulatory Visit: Payer: Medicaid Other

## 2014-07-16 ENCOUNTER — Other Ambulatory Visit (HOSPITAL_BASED_OUTPATIENT_CLINIC_OR_DEPARTMENT_OTHER): Payer: Medicaid Other

## 2014-07-16 DIAGNOSIS — C3490 Malignant neoplasm of unspecified part of unspecified bronchus or lung: Secondary | ICD-10-CM

## 2014-07-16 DIAGNOSIS — C3410 Malignant neoplasm of upper lobe, unspecified bronchus or lung: Secondary | ICD-10-CM

## 2014-07-16 LAB — CBC WITH DIFFERENTIAL/PLATELET
BASO%: 1.8 % (ref 0.0–2.0)
BASOS ABS: 0.1 10*3/uL (ref 0.0–0.1)
EOS%: 0.3 % (ref 0.0–7.0)
Eosinophils Absolute: 0 10*3/uL (ref 0.0–0.5)
HEMATOCRIT: 23 % — AB (ref 38.4–49.9)
HEMOGLOBIN: 7.9 g/dL — AB (ref 13.0–17.1)
LYMPH#: 0.8 10*3/uL — AB (ref 0.9–3.3)
LYMPH%: 24.2 % (ref 14.0–49.0)
MCH: 30.7 pg (ref 27.2–33.4)
MCHC: 34.3 g/dL (ref 32.0–36.0)
MCV: 89.5 fL (ref 79.3–98.0)
MONO#: 0.9 10*3/uL (ref 0.1–0.9)
MONO%: 25.7 % — ABNORMAL HIGH (ref 0.0–14.0)
NEUT%: 48 % (ref 39.0–75.0)
NEUTROS ABS: 1.6 10*3/uL (ref 1.5–6.5)
Platelets: 45 10*3/uL — ABNORMAL LOW (ref 140–400)
RBC: 2.57 10*6/uL — ABNORMAL LOW (ref 4.20–5.82)
RDW: 17.8 % — AB (ref 11.0–14.6)
WBC: 3.4 10*3/uL — ABNORMAL LOW (ref 4.0–10.3)
nRBC: 0 % (ref 0–0)

## 2014-07-16 LAB — COMPREHENSIVE METABOLIC PANEL (CC13)
ALBUMIN: 3.7 g/dL (ref 3.5–5.0)
ALK PHOS: 142 U/L (ref 40–150)
ALT: 13 U/L (ref 0–55)
AST: 18 U/L (ref 5–34)
Anion Gap: 11 mEq/L (ref 3–11)
BUN: 33.5 mg/dL — ABNORMAL HIGH (ref 7.0–26.0)
CO2: 23 meq/L (ref 22–29)
Calcium: 9.8 mg/dL (ref 8.4–10.4)
Chloride: 105 mEq/L (ref 98–109)
Creatinine: 1.3 mg/dL (ref 0.7–1.3)
GLUCOSE: 96 mg/dL (ref 70–140)
POTASSIUM: 3.6 meq/L (ref 3.5–5.1)
Sodium: 138 mEq/L (ref 136–145)
Total Bilirubin: 0.48 mg/dL (ref 0.20–1.20)
Total Protein: 6.6 g/dL (ref 6.4–8.3)

## 2014-07-19 ENCOUNTER — Telehealth: Payer: Self-pay | Admitting: Internal Medicine

## 2014-07-19 ENCOUNTER — Other Ambulatory Visit (HOSPITAL_BASED_OUTPATIENT_CLINIC_OR_DEPARTMENT_OTHER): Payer: Medicaid Other

## 2014-07-19 DIAGNOSIS — C3490 Malignant neoplasm of unspecified part of unspecified bronchus or lung: Secondary | ICD-10-CM

## 2014-07-19 DIAGNOSIS — C3410 Malignant neoplasm of upper lobe, unspecified bronchus or lung: Secondary | ICD-10-CM

## 2014-07-19 LAB — CBC WITH DIFFERENTIAL/PLATELET
BASO%: 1 % (ref 0.0–2.0)
Basophils Absolute: 0.1 10*3/uL (ref 0.0–0.1)
EOS ABS: 0 10*3/uL (ref 0.0–0.5)
EOS%: 0.3 % (ref 0.0–7.0)
HCT: 24.4 % — ABNORMAL LOW (ref 38.4–49.9)
HGB: 8.1 g/dL — ABNORMAL LOW (ref 13.0–17.1)
LYMPH%: 10.9 % — AB (ref 14.0–49.0)
MCH: 31.3 pg (ref 27.2–33.4)
MCHC: 33.2 g/dL (ref 32.0–36.0)
MCV: 94.2 fL (ref 79.3–98.0)
MONO#: 0.9 10*3/uL (ref 0.1–0.9)
MONO%: 10 % (ref 0.0–14.0)
NEUT#: 7 10*3/uL — ABNORMAL HIGH (ref 1.5–6.5)
NEUT%: 77.8 % — ABNORMAL HIGH (ref 39.0–75.0)
PLATELETS: 35 10*3/uL — AB (ref 140–400)
RBC: 2.6 10*6/uL — AB (ref 4.20–5.82)
RDW: 19.6 % — AB (ref 11.0–14.6)
WBC: 9 10*3/uL (ref 4.0–10.3)
lymph#: 1 10*3/uL (ref 0.9–3.3)

## 2014-07-19 LAB — COMPREHENSIVE METABOLIC PANEL (CC13)
ALBUMIN: 3.7 g/dL (ref 3.5–5.0)
ALT: 12 U/L (ref 0–55)
ANION GAP: 7 meq/L (ref 3–11)
AST: 17 U/L (ref 5–34)
Alkaline Phosphatase: 157 U/L — ABNORMAL HIGH (ref 40–150)
BUN: 20 mg/dL (ref 7.0–26.0)
CO2: 31 mEq/L — ABNORMAL HIGH (ref 22–29)
Calcium: 9.8 mg/dL (ref 8.4–10.4)
Chloride: 103 mEq/L (ref 98–109)
Creatinine: 1.2 mg/dL (ref 0.7–1.3)
GLUCOSE: 112 mg/dL (ref 70–140)
POTASSIUM: 3.2 meq/L — AB (ref 3.5–5.1)
SODIUM: 142 meq/L (ref 136–145)
TOTAL PROTEIN: 6.6 g/dL (ref 6.4–8.3)

## 2014-07-19 NOTE — Progress Notes (Signed)
Quick Note:  Call patient with the result and encourage K rich diet ______ 

## 2014-07-19 NOTE — Telephone Encounter (Signed)
pt here today...add on lab to day..done

## 2014-07-20 ENCOUNTER — Telehealth: Payer: Self-pay | Admitting: *Deleted

## 2014-07-20 ENCOUNTER — Ambulatory Visit: Payer: Medicaid Other

## 2014-07-20 ENCOUNTER — Other Ambulatory Visit: Payer: Medicaid Other

## 2014-07-20 NOTE — Telephone Encounter (Signed)
Called patient with lab results and encouraged potassium rich diet.  Per Dr. Julien Nordmann.  Patient verbalized understanding.

## 2014-07-20 NOTE — Telephone Encounter (Signed)
Message copied by Wardell Heath on Tue Jul 20, 2014 10:20 AM ------      Message from: Britt Bottom      Created: Tue Jul 20, 2014  9:41 AM                   ----- Message -----         From: Curt Bears, MD         Sent: 07/19/2014   2:00 PM           To: Carlton Adam, PA-C, #            Call patient with the result and encourage K rich diet. ------

## 2014-07-21 ENCOUNTER — Ambulatory Visit: Payer: Medicaid Other

## 2014-07-22 ENCOUNTER — Ambulatory Visit: Payer: Medicaid Other

## 2014-07-26 ENCOUNTER — Other Ambulatory Visit: Payer: Self-pay | Admitting: Medical Oncology

## 2014-07-26 DIAGNOSIS — C3490 Malignant neoplasm of unspecified part of unspecified bronchus or lung: Secondary | ICD-10-CM

## 2014-07-26 DIAGNOSIS — C7951 Secondary malignant neoplasm of bone: Secondary | ICD-10-CM

## 2014-07-26 DIAGNOSIS — C7952 Secondary malignant neoplasm of bone marrow: Secondary | ICD-10-CM

## 2014-07-26 MED ORDER — OXYCODONE-ACETAMINOPHEN 5-325 MG PO TABS: 1.0000 | ORAL_TABLET | Freq: Four times a day (QID) | ORAL | Status: DC | PRN

## 2014-07-26 MED ORDER — HYDROCODONE-ACETAMINOPHEN 5-325 MG PO TABS: ORAL_TABLET | ORAL | Status: DC

## 2014-07-26 MED ORDER — PROMETHAZINE HCL 25 MG PO TABS: 25.0000 mg | ORAL_TABLET | Freq: Four times a day (QID) | ORAL | Status: DC | PRN

## 2014-07-26 NOTE — Telephone Encounter (Signed)
RX authorized and locked in injection room.

## 2014-07-27 ENCOUNTER — Telehealth: Payer: Self-pay | Admitting: *Deleted

## 2014-07-27 ENCOUNTER — Ambulatory Visit: Payer: Medicaid Other | Admitting: Physician Assistant

## 2014-07-27 ENCOUNTER — Other Ambulatory Visit: Payer: Medicaid Other

## 2014-07-27 ENCOUNTER — Other Ambulatory Visit: Payer: Self-pay | Admitting: Medical Oncology

## 2014-07-27 ENCOUNTER — Telehealth: Payer: Self-pay | Admitting: Physician Assistant

## 2014-07-27 ENCOUNTER — Ambulatory Visit: Payer: Medicaid Other

## 2014-07-27 NOTE — Telephone Encounter (Signed)
Per [facility] staff phone call scheduled appts. Advised schedulers

## 2014-07-27 NOTE — Telephone Encounter (Signed)
, °

## 2014-07-27 NOTE — Telephone Encounter (Signed)
erroneous

## 2014-07-28 ENCOUNTER — Ambulatory Visit: Payer: Medicaid Other

## 2014-07-29 ENCOUNTER — Ambulatory Visit: Payer: Medicaid Other

## 2014-07-30 ENCOUNTER — Ambulatory Visit: Payer: Medicaid Other

## 2014-08-03 ENCOUNTER — Ambulatory Visit (HOSPITAL_BASED_OUTPATIENT_CLINIC_OR_DEPARTMENT_OTHER): Payer: Medicaid Other | Admitting: Nurse Practitioner

## 2014-08-03 ENCOUNTER — Other Ambulatory Visit (HOSPITAL_BASED_OUTPATIENT_CLINIC_OR_DEPARTMENT_OTHER): Payer: Medicaid Other

## 2014-08-03 ENCOUNTER — Ambulatory Visit: Payer: Medicaid Other | Admitting: Nurse Practitioner

## 2014-08-03 ENCOUNTER — Encounter (HOSPITAL_COMMUNITY): Payer: Self-pay | Admitting: Emergency Medicine

## 2014-08-03 ENCOUNTER — Telehealth: Payer: Self-pay | Admitting: Physician Assistant

## 2014-08-03 ENCOUNTER — Ambulatory Visit: Payer: Medicaid Other | Admitting: Physician Assistant

## 2014-08-03 ENCOUNTER — Ambulatory Visit: Payer: Medicaid Other

## 2014-08-03 ENCOUNTER — Other Ambulatory Visit: Payer: Medicaid Other

## 2014-08-03 ENCOUNTER — Emergency Department (HOSPITAL_COMMUNITY)
Admission: EM | Admit: 2014-08-03 | Discharge: 2014-08-03 | Disposition: A | Discharge status: Home / Self Care | Payer: Medicaid Other | Attending: Emergency Medicine | Admitting: Emergency Medicine

## 2014-08-03 ENCOUNTER — Encounter: Payer: Self-pay | Admitting: Nurse Practitioner

## 2014-08-03 ENCOUNTER — Ambulatory Visit (HOSPITAL_COMMUNITY): Payer: Medicaid Other

## 2014-08-03 VITALS — BP 141/80 | HR 61 | Temp 98.0°F | Resp 18 | Ht 70.0 in | Wt 136.9 lb

## 2014-08-03 DIAGNOSIS — Z923 Personal history of irradiation: Secondary | ICD-10-CM | POA: Insufficient documentation

## 2014-08-03 DIAGNOSIS — C801 Malignant (primary) neoplasm, unspecified: Secondary | ICD-10-CM | POA: Insufficient documentation

## 2014-08-03 DIAGNOSIS — C3490 Malignant neoplasm of unspecified part of unspecified bronchus or lung: Secondary | ICD-10-CM

## 2014-08-03 DIAGNOSIS — R530 Neoplastic (malignant) related fatigue: Secondary | ICD-10-CM

## 2014-08-03 DIAGNOSIS — C7800 Secondary malignant neoplasm of unspecified lung: Secondary | ICD-10-CM | POA: Diagnosis not present

## 2014-08-03 DIAGNOSIS — Z87891 Personal history of nicotine dependence: Secondary | ICD-10-CM | POA: Insufficient documentation

## 2014-08-03 DIAGNOSIS — C787 Secondary malignant neoplasm of liver and intrahepatic bile duct: Secondary | ICD-10-CM | POA: Insufficient documentation

## 2014-08-03 DIAGNOSIS — C799 Secondary malignant neoplasm of unspecified site: Secondary | ICD-10-CM

## 2014-08-03 DIAGNOSIS — C3410 Malignant neoplasm of upper lobe, unspecified bronchus or lung: Secondary | ICD-10-CM

## 2014-08-03 DIAGNOSIS — R109 Unspecified abdominal pain: Secondary | ICD-10-CM | POA: Insufficient documentation

## 2014-08-03 DIAGNOSIS — K5903 Drug induced constipation: Secondary | ICD-10-CM | POA: Insufficient documentation

## 2014-08-03 DIAGNOSIS — K5909 Other constipation: Secondary | ICD-10-CM

## 2014-08-03 DIAGNOSIS — R5383 Other fatigue: Secondary | ICD-10-CM | POA: Insufficient documentation

## 2014-08-03 DIAGNOSIS — R5381 Other malaise: Secondary | ICD-10-CM

## 2014-08-03 DIAGNOSIS — T402X5A Adverse effect of other opioids, initial encounter: Secondary | ICD-10-CM | POA: Insufficient documentation

## 2014-08-03 DIAGNOSIS — C786 Secondary malignant neoplasm of retroperitoneum and peritoneum: Secondary | ICD-10-CM | POA: Diagnosis not present

## 2014-08-03 LAB — COMPREHENSIVE METABOLIC PANEL (CC13)
ALK PHOS: 127 U/L (ref 40–150)
ALT: 6 U/L (ref 0–55)
AST: 19 U/L (ref 5–34)
Albumin: 3.8 g/dL (ref 3.5–5.0)
Anion Gap: 11 mEq/L (ref 3–11)
BUN: 13 mg/dL (ref 7.0–26.0)
CO2: 24 mEq/L (ref 22–29)
Calcium: 10 mg/dL (ref 8.4–10.4)
Chloride: 101 mEq/L (ref 98–109)
Creatinine: 1 mg/dL (ref 0.7–1.3)
Glucose: 93 mg/dl (ref 70–140)
POTASSIUM: 4.4 meq/L (ref 3.5–5.1)
SODIUM: 136 meq/L (ref 136–145)
TOTAL PROTEIN: 7.2 g/dL (ref 6.4–8.3)
Total Bilirubin: 0.34 mg/dL (ref 0.20–1.20)

## 2014-08-03 LAB — CBC WITH DIFFERENTIAL/PLATELET
BASO%: 0.7 % (ref 0.0–2.0)
BASOS PCT: 1 % (ref 0–1)
Basophils Absolute: 0.1 10*3/uL (ref 0.0–0.1)
Basophils Absolute: 0.1 10*3/uL (ref 0.0–0.1)
EOS PCT: 0 % (ref 0–5)
EOS%: 0.2 % (ref 0.0–7.0)
Eosinophils Absolute: 0 10*3/uL (ref 0.0–0.7)
Eosinophils Absolute: 0 10*3/uL (ref 0.0–0.5)
HCT: 32.1 % — ABNORMAL LOW (ref 38.4–49.9)
HEMATOCRIT: 30.8 % — AB (ref 39.0–52.0)
HEMOGLOBIN: 10.6 g/dL — AB (ref 13.0–17.1)
Hemoglobin: 10.5 g/dL — ABNORMAL LOW (ref 13.0–17.0)
LYMPH%: 7.7 % — ABNORMAL LOW (ref 14.0–49.0)
Lymphocytes Relative: 10 % — ABNORMAL LOW (ref 12–46)
Lymphs Abs: 0.6 10*3/uL — ABNORMAL LOW (ref 0.7–4.0)
MCH: 32 pg (ref 26.0–34.0)
MCH: 31.7 pg (ref 27.2–33.4)
MCHC: 34.1 g/dL (ref 30.0–36.0)
MCHC: 32.9 g/dL (ref 32.0–36.0)
MCV: 93.9 fL (ref 78.0–100.0)
MCV: 96.4 fL (ref 79.3–98.0)
MONO ABS: 0.5 10*3/uL (ref 0.1–1.0)
MONO#: 0.7 10*3/uL (ref 0.1–0.9)
MONO%: 8.4 % (ref 0.0–14.0)
Monocytes Relative: 7 % (ref 3–12)
NEUT#: 7.4 10*3/uL — ABNORMAL HIGH (ref 1.5–6.5)
NEUT%: 83 % — ABNORMAL HIGH (ref 39.0–75.0)
Neutro Abs: 5.2 10*3/uL (ref 1.7–7.7)
Neutrophils Relative %: 82 % — ABNORMAL HIGH (ref 43–77)
Platelets: 182 10*3/uL (ref 150–400)
Platelets: 201 10*3/uL (ref 140–400)
RBC: 3.28 MIL/uL — ABNORMAL LOW (ref 4.22–5.81)
RBC: 3.33 10*6/uL — ABNORMAL LOW (ref 4.20–5.82)
RDW: 19.5 % — ABNORMAL HIGH (ref 11.5–15.5)
RDW: 21.9 % — AB (ref 11.0–14.6)
WBC: 6.4 10*3/uL (ref 4.0–10.5)
WBC: 8.9 10*3/uL (ref 4.0–10.3)
lymph#: 0.7 10*3/uL — ABNORMAL LOW (ref 0.9–3.3)

## 2014-08-03 LAB — COMPREHENSIVE METABOLIC PANEL
ALBUMIN: 4.1 g/dL (ref 3.5–5.2)
ALT: 10 U/L (ref 0–53)
AST: 21 U/L (ref 0–37)
Alkaline Phosphatase: 149 U/L — ABNORMAL HIGH (ref 39–117)
Anion gap: 13 (ref 5–15)
BUN: 15 mg/dL (ref 6–23)
CO2: 26 mEq/L (ref 19–32)
CREATININE: 1.1 mg/dL (ref 0.50–1.35)
Calcium: 10.7 mg/dL — ABNORMAL HIGH (ref 8.4–10.5)
Chloride: 99 mEq/L (ref 96–112)
GFR calc Af Amer: 81 mL/min — ABNORMAL LOW (ref >90)
GFR calc non Af Amer: 70 mL/min — ABNORMAL LOW (ref >90)
Glucose, Bld: 105 mg/dL — ABNORMAL HIGH (ref 70–99)
Potassium: 4.3 mEq/L (ref 3.7–5.3)
SODIUM: 138 meq/L (ref 137–147)
TOTAL PROTEIN: 7.8 g/dL (ref 6.0–8.3)
Total Bilirubin: 0.3 mg/dL (ref 0.3–1.2)

## 2014-08-03 LAB — URINALYSIS, ROUTINE W REFLEX MICROSCOPIC
Bilirubin Urine: NEGATIVE
GLUCOSE, UA: NEGATIVE mg/dL
HGB URINE DIPSTICK: NEGATIVE
Ketones, ur: NEGATIVE mg/dL
LEUKOCYTES UA: NEGATIVE
Nitrite: NEGATIVE
Protein, ur: NEGATIVE mg/dL
Specific Gravity, Urine: 1.025 (ref 1.005–1.030)
Urobilinogen, UA: 1 mg/dL (ref 0.0–1.0)
pH: 7 (ref 5.0–8.0)

## 2014-08-03 MED ORDER — IOHEXOL 300 MG/ML  SOLN
50.0000 mL | Freq: Once | INTRAMUSCULAR | Status: AC | PRN
  Administered 2014-08-03: 50 mL via ORAL

## 2014-08-03 MED ORDER — HYDROMORPHONE HCL PF 1 MG/ML IJ SOLN
1.0000 mg | Freq: Once | INTRAMUSCULAR | Status: AC
  Administered 2014-08-03: 1 mg via INTRAVENOUS
  Filled 2014-08-03: qty 1

## 2014-08-03 MED ORDER — ONDANSETRON 8 MG PO TBDP: 8.0000 mg | ORAL_TABLET | Freq: Three times a day (TID) | ORAL | Status: DC | PRN

## 2014-08-03 MED ORDER — SODIUM CHLORIDE 0.9 % IV SOLN
1000.0000 mL | INTRAVENOUS | Status: DC
  Administered 2014-08-03: 1000 mL via INTRAVENOUS

## 2014-08-03 MED ORDER — IOHEXOL 300 MG/ML  SOLN
100.0000 mL | Freq: Once | INTRAMUSCULAR | Status: AC | PRN
  Administered 2014-08-03: 100 mL via INTRAVENOUS

## 2014-08-03 MED ORDER — SODIUM CHLORIDE 0.9 % IV SOLN
1000.0000 mL | Freq: Once | INTRAVENOUS | Status: AC
  Administered 2014-08-03: 1000 mL via INTRAVENOUS

## 2014-08-03 NOTE — Telephone Encounter (Signed)
per AJ to sch appt w/[facility] pt didnt show this morning. Put on [facility] sch & lab-sent email to MW to chge pt infusion to [facility] Sat-will give pt updated sch on 8/19

## 2014-08-03 NOTE — Discharge Instructions (Signed)

## 2014-08-03 NOTE — ED Notes (Signed)
Bed: [facility]03 Expected date:  Expected time:  Means of arrival:  Comments: EMS- abdominal pain, [place] Pt

## 2014-08-03 NOTE — Assessment & Plan Note (Signed)
Patient did present to the emergency department this morning for further evaluation of his worsening abdominal discomfort.  He has been taking both oxycodone and hydrocodone on  an alternating basis for pain control. he states that his pain has now decreased; and is manageable once again with both his hydrocodone and his oxycodone.  He states he does not need any refills of either one of these pain medications at this time.

## 2014-08-03 NOTE — ED Notes (Signed)
Patient was unable to urinate. Urinal was given to try again in a few.

## 2014-08-03 NOTE — Assessment & Plan Note (Signed)
CT from earlier this morning did reveal the patient has a moderate stool burden; despite the fact that he did have a normal bowel movement just yesterday.  This also, this is due to chronic narcotic pain medication use.  Extensively reviewed as the need for patient to be on a bowel regimen which should include stool softeners on a twice-daily; and MiraLAX to make sure he stays regular.  The patient written instructions for bowel regimen after this review as well.

## 2014-08-03 NOTE — ED Notes (Signed)
Per EMS pt has chemo appointment today [facility] at noon, woke up this morning with extreme abdominal pain, took hydrocodone and oxycodone with no relief, came to ED for pain relief and to be evaluated. Denies constipation, diarrhea. Pt states he started new type of chemo and is unsure if that is cause of pain.

## 2014-08-03 NOTE — ED Notes (Signed)
Patient refused to give urine due to him being in pain. Will give it once he feel better

## 2014-08-03 NOTE — Assessment & Plan Note (Signed)
Patient was scheduled to receive cycle 4 of carboplatin/etoposide chemotherapy regimen.  However, he presented to the emergency department for further evaluation of increasing abdominal discomfort earlier today; so will need to reschedule patient's chemotherapy till tomorrow Wednesday, 08/04/2014.  Patient received a his carboplatin/etoposide chemotherapy regimen as previously prescribed; and will receive Neulasta injection for growth factor support on Saturday, 08/07/2014.    He did obtain a CT of of the abdomen/pelvis while in the emergency department this morning-which did show a slight progression only of his disease.

## 2014-08-03 NOTE — Progress Notes (Signed)
[place]   Chief Complaint  Patient presents with  . Follow-up    HPI: Jacob Webb 63 y.o. male diagnosed with small cell lung cancer.  Currently undergoing carboplatin/etoposide chemotherapy regimen.  Patient was initially scheduled for cycle 4 of his chemotherapy today; but he did present to the emergency department earlier this morning for further evaluation of his worsening abdominal discomfort.  The patient did obtain a restaging CT of the abdomen and pelvis-which did reveal a slight progression of his disease.  Patient states his abdominal discomfort has now decreased; and that he does feel well enough to proceed with chemotherapy.  Does the patient presented to the emergency department in the late afternoon-patient will be rescheduled for a chemotherapy in the morning.  The patient states that his chronic issues with fatigue are stable at present.  He states his appetite per minute mean stable.  He denies any recent fever or chills.  He does state that he did have a regular bowel movement just yesterday.   HPI  CURRENT THERAPY: Upcoming Treatment Dates - LUNG SMALL CELL Carboplatin D1 / Etoposide D1-3 q21d Days with orders from any treatment category:  07/27/2014      SCHEDULING COMMUNICATION      ondansetron (ZOFRAN) IVPB 16 mg      Dexamethasone Sodium Phosphate (DECADRON) injection 20 mg      CARBOplatin CHEMO intradermal Test Dose 100 mcg/0.65ml      CARBOplatin (PARAPLATIN) in sodium chloride 0.9 % 100 mL chemo infusion      etoposide (VEPESID) 180 mg in sodium chloride 0.9 % 500 mL chemo infusion      sodium chloride 0.9 % injection 10 mL      heparin lock flush 100 unit/mL      heparin lock flush 100 unit/mL      alteplase (CATHFLO ACTIVASE) injection 2 mg      sodium chloride 0.9 % injection 3 mL      Hot Pack 1 packet      0.9 %  sodium chloride infusion      TREATMENT CONDITIONS 07/28/2014      SCHEDULING COMMUNICATION      prochlorperazine  (COMPAZINE) tablet 10 mg      etoposide (VEPESID) 180 mg in sodium chloride 0.9 % 500 mL chemo infusion      sodium chloride 0.9 % injection 10 mL      heparin lock flush 100 unit/mL      heparin lock flush 100 unit/mL      alteplase (CATHFLO ACTIVASE) injection 2 mg      sodium chloride 0.9 % injection 3 mL      Hot Pack 1 packet      0.9 %  sodium chloride infusion 07/29/2014      SCHEDULING COMMUNICATION      prochlorperazine (COMPAZINE) tablet 10 mg      etoposide (VEPESID) 180 mg in sodium chloride 0.9 % 500 mL chemo infusion      sodium chloride 0.9 % injection 10 mL      heparin lock flush 100 unit/mL      heparin lock flush 100 unit/mL      alteplase (CATHFLO ACTIVASE) injection 2 mg      sodium chloride 0.9 % injection 3 mL      Hot Pack 1 packet      0.9 %  sodium chloride infusion    Review of Systems  Constitutional: Positive for malaise/fatigue. Negative for fever and chills.  HENT: Negative.   Eyes: Negative.   Respiratory: Negative.   Cardiovascular: Negative.   Gastrointestinal: Positive for abdominal pain and constipation. Negative for nausea, vomiting, diarrhea and blood in stool.  Genitourinary: Negative.   Musculoskeletal: Negative.   Skin: Negative.   Neurological: Negative.   Endo/Heme/Allergies: Negative.   Psychiatric/Behavioral: Negative.   All other systems reviewed and are negative.   Past Medical History  Diagnosis Date  . Cancer     lung ca dx'd 04/2013  . Cancer 11/30/13 ct    rt para renal mass metastatic  . History of radiation therapy 01/07/14-01/26/14    abdomen/30 Gy/27fx  . Hx of radiation therapy 04/05/14-04/09/14    palliative left femur, 20Gy    History reviewed. No pertinent past surgical history.  has Malignant neoplasm of bronchus and lung, unspecified site; Delirium; Agitation; Hypokalemia; Pancytopenia; Secondary malignant neoplasm of bone and bone marrow; Fatigue; Abdominal pain, unspecified site; and Constipation due to  opioid therapy on his problem list.     has No Known Allergies.    Medication List       This list is accurate as of: 08/03/14  6:00 PM.  Always use your most recent med list.               HYDROcodone-acetaminophen 5-325 MG per tablet  Commonly known as:  NORCO/VICODIN  Take 1 tablet by mouth every 4 (four) hours as needed for moderate pain.     ondansetron 8 MG disintegrating tablet  Commonly known as:  ZOFRAN ODT  Take 1 tablet (8 mg total) by mouth every 8 (eight) hours as needed for nausea or vomiting.     oxyCODONE-acetaminophen 5-325 MG per tablet  Commonly known as:  PERCOCET/ROXICET  Take 1 tablet by mouth every 6 (six) hours as needed for severe pain.     promethazine 25 MG tablet  Commonly known as:  PHENERGAN  Take 25 mg by mouth every 6 (six) hours as needed for nausea or vomiting.         PHYSICAL EXAMINATION  Blood pressure 141/80, pulse 61, temperature 98 F (36.7 C), temperature source Oral, resp. rate 18, height 5\' 10"  (1.778 m), weight 136 lb 14.4 oz (62.097 kg), SpO2 100.00%.  Physical Exam  Nursing note and vitals reviewed. Constitutional: He is oriented to person, place, and time. Vital signs are normal. He appears unhealthy. No distress.  HENT:  Head: Normocephalic and atraumatic.  Eyes: Conjunctivae are normal. Pupils are equal, round, and reactive to light.  Neck: Normal range of motion. Neck supple.  Cardiovascular: Normal rate, regular rhythm and normal heart sounds.   Pulmonary/Chest: Effort normal and breath sounds normal. No respiratory distress.  Abdominal: Soft. Bowel sounds are normal. He exhibits no distension. There is no tenderness. There is no rebound and no guarding.  Musculoskeletal: Normal range of motion. He exhibits tenderness. He exhibits no edema.  Neurological: He is alert and oriented to person, place, and time. Gait normal.  Skin: Skin is warm and dry. He is not diaphoretic.  Psychiatric: Affect normal.    LABORATORY  DATA:. CBC  Lab Results  Component Value Date   WBC 8.9 08/03/2014   RBC 3.33* 08/03/2014   HGB 10.6* 08/03/2014   HCT 32.1* 08/03/2014   PLT 201 08/03/2014   MCV 96.4 08/03/2014   MCH 31.7 08/03/2014   MCHC 32.9 08/03/2014   RDW 21.9* 08/03/2014   LYMPHSABS 0.7* 08/03/2014   MONOABS 0.7 08/03/2014   EOSABS 0.0 08/03/2014  BASOSABS 0.1 08/03/2014     CMET  Lab Results  Component Value Date   NA 136 08/03/2014   K 4.4 08/03/2014   CL 99 08/03/2014   CO2 24 08/03/2014   GLUCOSE 93 08/03/2014   BUN 13.0 08/03/2014   CREATININE 1.0 08/03/2014   CALCIUM 10.0 08/03/2014   PROT 7.2 08/03/2014   ALBUMIN 3.8 08/03/2014   AST 19 08/03/2014   ALT 6 08/03/2014   ALKPHOS 127 08/03/2014   BILITOT 0.34 08/03/2014   GFRNONAA 70* 08/03/2014   GFRAA 81* 08/03/2014     RADIOGRAPHIC STUDIES:EXAM:  CT ABDOMEN AND PELVIS WITH CONTRAST  TECHNIQUE:  Multidetector CT imaging of the abdomen and pelvis was performed  using the standard protocol following bolus administration of  intravenous contrast.  CONTRAST: 132mL OMNIPAQUE IOHEXOL 300 MG/ML SOLN  COMPARISON: 07/05/2014  FINDINGS:  Lung bases are clear. No evidence for free intraperitoneal air.  Again noted is a soft tissue lesion between the liver and right  kidney that measures 2.0 x 2.4 cm and previously measured 2.1 x 2.0  cm. There are multiple low-density lesions in the inferior right  hepatic lobe. Difficult to evaluate for interval progression of the  liver disease because most recent comparison exams were without  contrast. Index lesion measures up to 1.7 cm on sequence 2, image  24. Portal venous system is patent. Normal appearance of the  gallbladder. Normal appearance of the spleen, pancreas and left  adrenal gland. Again noted is a right adrenal lesion which now  measures 2.8 x 2.7 cm and previously measured 2.5 x 2.9 cm. Again  noted is a large right retroperitoneal lymph node/lesion along the  right side of the IVC that measures 2.9 x 2.0  cm and previously  measured 2.3 x 1.8 cm. There is moderate-severe right hydronephrosis  which is similar to the previous examination. Right hydronephrosis  appears to be secondary to right pericaval node/lesion. Distal right  ureter is decompressed. There is small amount of fluid in the  urinary bladder. Normal appearance of the left kidney.  Mild distention of the stomach. There is gaseous distension of the  transverse colon which also contains a moderate amount of stool. No  significant small bowel dilatation. No evidence for free fluid in  the pelvis. There is material or structure in the right inguinal  canal and this is not consistent with simple fluid. This could  represent the right testicle. No significant inguinal hernia.  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 No acute  bone abnormality.  IMPRESSION:  There has been mild progression of the metastatic disease in the  abdomen. Patient has multiple liver lesions and mild interval  enlargement of the right retroperitoneal lesions.  Moderate-to-severe right hydronephrosis is similar to the comparison  examination. Right hydronephrosis is due to the pericaval lesion.  There is new material within the right inguinal canal. This may  represent right testicle. Findings are not compatible with simple  fluid. Recommend clinical correlation in this area.  Electronically Signed  By: Markus Daft M.D.  On: 08/03/2014 12:58   ASSESSMENT/PLAN:    Malignant neoplasm of bronchus and lung, unspecified site  Assessment & Plan Patient was scheduled to receive cycle 4 of carboplatin/etoposide chemotherapy regimen.  However, he presented to the emergency department for further evaluation of increasing abdominal discomfort earlier today; so will need to reschedule patient's chemotherapy till tomorrow Wednesday, 08/04/2014.  Patient received a his carboplatin/etoposide chemotherapy regimen as previously prescribed; and will receive  Neulasta injection for growth factor support on Saturday, 08/07/2014.    He did obtain a CT of of the abdomen/pelvis while in the emergency department this morning-which did show a slight progression only of his disease.   Fatigue  Assessment & Plan Most likely, the patient's fatigue is related to both his disease and a chemotherapy side effect.  Patient was encouraged to remain as active as possible.   Abdominal pain, unspecified site  Assessment & Plan Patient did present to the emergency department this morning for further evaluation of his worsening abdominal discomfort.  He has been taking both oxycodone and hydrocodone on  an alternating basis for pain control. he states that his pain has now decreased; and is manageable once again with both his hydrocodone and his oxycodone.  He states he does not need any refills of either one of these pain medications at this time.     Constipation due to opioid therapy  Assessment & Plan CT from earlier this morning did reveal the patient has a moderate stool burden; despite the fact that he did have a normal bowel movement just yesterday.  This also, this is due to chronic narcotic pain medication use.  Extensively reviewed as the need for patient to be on a bowel regimen which should include stool softeners on a twice-daily; and MiraLAX to make sure he stays regular.  The patient written instructions for bowel regimen after this review as well.   Patient stated understanding of all instructions; and was in agreement with this plan of care. The patient knows to call the clinic with any problems, questions or concerns.   This was a shared visit with Dr. Julien Nordmann today.   Total time spent with patient was 25 minutes;  with greater than 75 percent of that time spent in face to face counseling regarding his symptoms, review of lab results, review of CT scan results, and coordination of care and follow up.  Disclaimer: This note was dictated with  voice recognition software. Similar sounding words can inadvertently be transcribed and may not be corrected upon review.   Drue Second, NP 08/03/2014   ADDENDUM: Hematology/Oncology Attending: I had a face to face encounter with the patient. I recommended his care plan. This is a very pleasant 63 years old white male with recurrent small cell lung cancer. He is currently undergoing second course of systemic chemotherapy with carboplatin and etoposide status post 3 cycles. The patient was seen earlier today at the emergency Department for evaluation of abdominal pain. CT scan of the abdomen performed in the emergency Department showed mild progression of the metastatic disease in the abdomen with mild interval enlargement of a right retroperitoneal lesions. I discussed the scan results with the patient today. I recommended for him to continue with cycle #4 of his chemotherapy tomorrow as scheduled. He would have repeat CT scan of the chest in 3 weeks for evaluation of his disease in the chest. If he continues to have evidence for disease progression, I may consider the patient for second line chemotherapy with either cisplatin and irinotecan or single agent topotecan. I would also consider with him palliative care and hospice referral if he has deterioration of his condition. He was given a refill of his pain medication. He was advised to call immediately if he has any concerning symptoms in the interval.  Disclaimer: This note was dictated with voice recognition software. Similar sounding words can inadvertently be transcribed and may be missed upon review. [name]., MD  08/04/2014  

## 2014-08-03 NOTE — ED Provider Notes (Signed)
CSN: [id number]     Arrival date & time 08/03/14  1037 History   First MD Initiated Contact with Patient 08/03/14 1047     Chief Complaint  Patient presents with  . Abdominal Pain      The history is provided by the patient.  worsening lower abdominal pain over the past 2-3 days. No fever. Denies nausea vomiting.  Patient has a history of metastatic lung cancer with known metastases to his abdomen including his right liver in his right retroperitoneal space.  He has known right-sided hydronephrosis.  He reports his right-sided abdominal pain is worse in his left side.  He denies constipation.  Denies diarrhea.  Reports nausea without vomiting.  He tried his home medication which includes hydrocodone and oxycodone without relief of his symptoms.  His pain is mild to moderate in severity.  Denies dysuria.  Denies inguinal or testicular pain.  Denies right flank pain or radiation of his pain.  Chest pain or shortness of breath.  No recent fevers or illness  Past Medical History  Diagnosis Date  . Cancer     lung ca dx'd 04/2013  . Cancer 11/30/13 ct    rt para renal mass metastatic  . History of radiation therapy 01/07/14-01/26/14    abdomen/30 Gy/72fx  . Hx of radiation therapy 04/05/14-04/09/14    palliative left femur, 20Gy   History reviewed. No pertinent past surgical history. Family History  Problem Relation Age of Onset  . Aneurysm Mother   . COPD Father   . CAD Brother    History  Substance Use Topics  . Smoking status: Former Smoker -- 2.00 packs/day for 15 years    Types: Cigarettes    Quit date: 09/04/2013  . Smokeless tobacco: Never Used  . Alcohol Use: No    Review of Systems  Gastrointestinal: Positive for abdominal pain.  All other systems reviewed and are negative.     Allergies  Review of patient's allergies indicates no known allergies.  Home Medications   Prior to Admission medications   Medication Sig Start Date End Date Taking? Authorizing Provider   HYDROcodone-acetaminophen (NORCO/VICODIN) 5-325 MG per tablet Take 1 tablet by mouth every 4 (four) hours as needed for moderate pain.   Yes Historical Provider, MD  oxyCODONE-acetaminophen (PERCOCET/ROXICET) 5-325 MG per tablet Take 1 tablet by mouth every 6 (six) hours as needed for severe pain.   Yes Historical Provider, MD  promethazine (PHENERGAN) 25 MG tablet Take 25 mg by mouth every 6 (six) hours as needed for nausea or vomiting.   Yes Historical Provider, MD  ondansetron (ZOFRAN ODT) 8 MG disintegrating tablet Take 1 tablet (8 mg total) by mouth every 8 (eight) hours as needed for nausea or vomiting. 08/03/14   Hoy Morn, MD   BP 148/73  Pulse 67  Temp(Src) 97.6 F (36.4 C) (Oral)  SpO2 99% Physical Exam  Nursing note and vitals reviewed. Constitutional: He is oriented to person, place, and time. He appears well-developed and well-nourished.  HENT:  Head: Normocephalic and atraumatic.  Eyes: EOM are normal.  Neck: Normal range of motion.  Cardiovascular: Normal rate, regular rhythm, normal heart sounds and intact distal pulses.   Pulmonary/Chest: Effort normal and breath sounds normal. No respiratory distress.  Abdominal: Soft. He exhibits no distension.  Lower abdominal tenderness right greater than left.  No right inguinal mass.  Genitourinary:  No right inguinal mass or swelling or tenderness.  Normal lie of his testicles bilaterally.  Musculoskeletal: Normal range  of motion.  Neurological: He is alert and oriented to person, place, and time.  Skin: Skin is warm and dry.  Psychiatric: He has a normal mood and affect. Judgment normal.    ED Course  Procedures (including critical care time) Labs Review Labs Reviewed  CBC WITH DIFFERENTIAL - Abnormal; Notable for the following:    RBC 3.28 (*)    Hemoglobin 10.5 (*)    HCT 30.8 (*)    RDW 19.5 (*)    Neutrophils Relative % 82 (*)    Lymphocytes Relative 10 (*)    Lymphs Abs 0.6 (*)    All other components  within normal limits  COMPREHENSIVE METABOLIC PANEL - Abnormal; Notable for the following:    Glucose, Bld 105 (*)    Calcium 10.7 (*)    Alkaline Phosphatase 149 (*)    GFR calc non Af Amer 70 (*)    GFR calc Af Amer 81 (*)    All other components within normal limits  URINALYSIS, ROUTINE W REFLEX MICROSCOPIC - Abnormal; Notable for the following:    APPearance CLOUDY (*)    All other components within normal limits    Imaging Review Ct Abdomen Pelvis W Contrast  08/03/2014   CLINICAL DATA:  Extreme abdominal pain. Metastatic small cell lung cancer.  EXAM: CT ABDOMEN AND PELVIS WITH CONTRAST  TECHNIQUE: Multidetector CT imaging of the abdomen and pelvis was performed using the standard protocol following bolus administration of intravenous contrast.  CONTRAST:  131mL OMNIPAQUE IOHEXOL 300 MG/ML  SOLN  COMPARISON:  07/05/2014  FINDINGS: Lung bases are clear.  No evidence for free intraperitoneal air.  Again noted is a soft tissue lesion between the liver and right kidney that measures 2.0 x 2.4 cm and previously measured 2.1 x 2.0 cm. There are multiple low-density lesions in the inferior right hepatic lobe. Difficult to evaluate for interval progression of the liver disease because most recent comparison exams were without contrast. Index lesion measures up to 1.7 cm on sequence 2, image 24. Portal venous system is patent. Normal appearance of the gallbladder. Normal appearance of the spleen, pancreas and left adrenal gland. Again noted is a right adrenal lesion which now measures 2.8 x 2.7 cm and previously measured 2.5 x 2.9 cm. Again noted is a large right retroperitoneal lymph node/lesion along the right side of the IVC that measures 2.9 x 2.0 cm and previously measured 2.3 x 1.8 cm. There is moderate-severe right hydronephrosis which is similar to the previous examination. Right hydronephrosis appears to be secondary to right pericaval node/lesion. Distal right ureter is decompressed. There is  small amount of fluid in the urinary bladder. Normal appearance of the left kidney.  Mild distention of the stomach. There is gaseous distension of the transverse colon which also contains a moderate amount of stool. No significant small bowel dilatation. No evidence for free fluid in the pelvis. There is material or structure in the right inguinal canal and this is not consistent with simple fluid. This could represent the right testicle. No significant inguinal hernia.  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 No acute bone abnormality.  IMPRESSION: There has been mild progression of the metastatic disease in the abdomen. Patient has multiple liver lesions and mild interval enlargement of the right retroperitoneal lesions.  Moderate-to-severe right hydronephrosis is similar to the comparison examination. Right hydronephrosis is due to the pericaval lesion.  There is new material within the right inguinal canal. This may represent right testicle. Findings  are not compatible with simple fluid. Recommend clinical correlation in this area.   Electronically Signed   By: Markus Daft M.D.   On: 08/03/2014 12:58  I personally reviewed the imaging tests through PACS system I reviewed available ER/hospitalization records through the EMR    EKG Interpretation None      MDM   Final diagnoses:  Abdominal pain, unspecified abdominal location  Metastatic cancer   1:45 PM Patient feels much better at this time.  At this time CT abdomen and pelvis demonstrates mild progression of his metastatic disease.  It may be these having increasing pain from his right retroperitoneal metastases.  Hydronephrosis is stable.  Family reports that oncology is aware of his right-sided hydronephrosis and they're watching this.  His creatinine is normal.  Urinating normally.  No fever.  He has pain medicine at home.  No inguinal pain or testicular pain.  Normal lie of his testicles.    Hoy Morn,  MD 08/03/14 1346

## 2014-08-03 NOTE — Assessment & Plan Note (Signed)
Most likely, the patient's fatigue is related to both his disease and a chemotherapy side effect.  Patient was encouraged to remain as active as possible.

## 2014-08-04 ENCOUNTER — Ambulatory Visit (HOSPITAL_BASED_OUTPATIENT_CLINIC_OR_DEPARTMENT_OTHER): Payer: Medicaid Other | Admitting: Nurse Practitioner

## 2014-08-04 ENCOUNTER — Encounter: Payer: Self-pay | Admitting: Nurse Practitioner

## 2014-08-04 ENCOUNTER — Ambulatory Visit (HOSPITAL_BASED_OUTPATIENT_CLINIC_OR_DEPARTMENT_OTHER): Payer: Medicaid Other

## 2014-08-04 ENCOUNTER — Other Ambulatory Visit: Payer: Self-pay | Admitting: Internal Medicine

## 2014-08-04 VITALS — BP 105/74 | HR 81 | Temp 97.8°F | Resp 18

## 2014-08-04 DIAGNOSIS — T402X5A Adverse effect of other opioids, initial encounter: Secondary | ICD-10-CM

## 2014-08-04 DIAGNOSIS — K5909 Other constipation: Secondary | ICD-10-CM

## 2014-08-04 DIAGNOSIS — K5903 Drug induced constipation: Secondary | ICD-10-CM

## 2014-08-04 DIAGNOSIS — C3490 Malignant neoplasm of unspecified part of unspecified bronchus or lung: Secondary | ICD-10-CM

## 2014-08-04 DIAGNOSIS — C3410 Malignant neoplasm of upper lobe, unspecified bronchus or lung: Secondary | ICD-10-CM

## 2014-08-04 DIAGNOSIS — R109 Unspecified abdominal pain: Secondary | ICD-10-CM

## 2014-08-04 DIAGNOSIS — Z5111 Encounter for antineoplastic chemotherapy: Secondary | ICD-10-CM

## 2014-08-04 MED ORDER — SODIUM CHLORIDE 0.9 % IV SOLN
100.0000 mg/m2 | Freq: Once | INTRAVENOUS | Status: AC
  Administered 2014-08-04: 180 mg via INTRAVENOUS
  Filled 2014-08-04: qty 9

## 2014-08-04 MED ORDER — DEXAMETHASONE SODIUM PHOSPHATE 20 MG/5ML IJ SOLN
INTRAMUSCULAR | Status: AC
  Filled 2014-08-04: qty 5

## 2014-08-04 MED ORDER — CARBOPLATIN CHEMO INTRADERMAL TEST DOSE 100MCG/0.02ML
100.0000 ug | Freq: Once | INTRADERMAL | Status: AC
  Administered 2014-08-04: 100 ug via INTRADERMAL
  Filled 2014-08-04: qty 0.01

## 2014-08-04 MED ORDER — DEXAMETHASONE SODIUM PHOSPHATE 20 MG/5ML IJ SOLN
20.0000 mg | Freq: Once | INTRAMUSCULAR | Status: AC
  Administered 2014-08-04: 20 mg via INTRAVENOUS

## 2014-08-04 MED ORDER — ONDANSETRON 16 MG/50ML IVPB (CHCC)
INTRAVENOUS | Status: AC
  Filled 2014-08-04: qty 16

## 2014-08-04 MED ORDER — CARBOPLATIN CHEMO INJECTION 600 MG/60ML
460.0000 mg | Freq: Once | INTRAVENOUS | Status: AC
  Administered 2014-08-04: 460 mg via INTRAVENOUS
  Filled 2014-08-04: qty 46

## 2014-08-04 MED ORDER — SODIUM CHLORIDE 0.9 % IV SOLN
Freq: Once | INTRAVENOUS | Status: AC
  Administered 2014-08-04: 14:00:00 via INTRAVENOUS

## 2014-08-04 MED ORDER — ONDANSETRON 16 MG/50ML IVPB (CHCC)
16.0000 mg | Freq: Once | INTRAVENOUS | Status: AC
  Administered 2014-08-04: 16 mg via INTRAVENOUS

## 2014-08-04 MED ORDER — OXYCODONE HCL 5 MG PO TABS: ORAL_TABLET | ORAL | Status: DC

## 2014-08-04 MED ORDER — HYDROMORPHONE HCL PF 4 MG/ML IJ SOLN
2.0000 mg | Freq: Once | INTRAMUSCULAR | Status: AC
Start: 2014-08-04 — End: 2014-08-04
  Administered 2014-08-04: 2 mg via INTRAVENOUS

## 2014-08-04 MED ORDER — HYDROMORPHONE HCL PF 4 MG/ML IJ SOLN
INTRAMUSCULAR | Status: AC
  Filled 2014-08-04: qty 1

## 2014-08-04 NOTE — Assessment & Plan Note (Signed)
The patient will proceed today with cycle 4 of his carboplatin/etoposide chemotherapy regimen as previously directed.  He will be scheduled for a Neulasta injection this coming Saturday, 08/07/2014.  CT of the abdomen/pelvis that was obtained while in the emergency department just yesterday morning did show only a slight progression of his disease.

## 2014-08-04 NOTE — Assessment & Plan Note (Signed)
CT scan of the abdomen/pelvis that was obtained per the emergency department yesterday morning did show only slight progression of disease.  Is most likely that patient's continued/worsening abdominal pain is related to both his cancer in some mild to moderate constipation.  Since patient does have some liver involvement-will discontinue both the hydrocodone and the Percocet; and initiate patient on oxycodone 5-10 mg every 4 hours on an as-needed basis.  Carefully reviewed need to discontinue both the Vicodin and Percocet with patient.  The patient written instructions for this provider as well.

## 2014-08-04 NOTE — Patient Instructions (Signed)
[facility] Discharge Instructions for Patients Receiving Chemotherapy  Today you received the following chemotherapy agents carboplatin/etoposide.   To help prevent nausea and vomiting after your treatment, we encourage you to take your nausea medication as directed.    If you develop nausea and vomiting that is not controlled by your nausea medication, call the clinic.   BELOW ARE SYMPTOMS THAT SHOULD BE REPORTED IMMEDIATELY:  *FEVER GREATER THAN 100.5 F  *CHILLS WITH OR WITHOUT FEVER  NAUSEA AND VOMITING THAT IS NOT CONTROLLED WITH YOUR NAUSEA MEDICATION  *UNUSUAL SHORTNESS OF BREATH  *UNUSUAL BRUISING OR BLEEDING  TENDERNESS IN MOUTH AND THROAT WITH OR WITHOUT PRESENCE OF ULCERS  *URINARY PROBLEMS  *BOWEL PROBLEMS  UNUSUAL RASH Items with * indicate a potential emergency and should be followed up as soon as possible.  Feel free to call the clinic you have any questions or concerns. The clinic phone number is [phone].

## 2014-08-04 NOTE — Progress Notes (Signed)
[place]   Chief Complaint  Patient presents with  . Abdominal Pain    HPI: Jacob Webb 63 y.o. male diagnosed with small cell lung cancer.  Currently undergoing carboplatin/etoposide chemotherapy regimen.  Patient presented today for cycle 4 of his carboplatin/etoposide chemotherapy.  Patient presented to the emergency department just yesterday morning with complaint of worsening abdominal discomfort.  CT of the abdomen/pelvis obtained while in the emergency department did show only slight progression of disease.  It also showed a moderate stool burden as well; most likely a related to chronic narcotic pain medication use.  Patient has been taking both hydrocodone and Percocet on an alternating basis to manage his abdominal discomfort.  I was called in to the infusion area today with the patient complained of worsening/severe abdominal discomfort once again.  He states that taking the hydrocodone is no longer effective for him.  He has been taking his Percocet 10 mg every 4 hours with only some relief of pain.  He continues to deny any nausea/vomiting or diarrhea.  He states that he did start taking his stool softener tablets just yesterday; but has not yet obtained any MiraLAX to help with his chronic constipation.  He continues to deny any fever or chills: Or other symptoms whatsoever.  Abdominal Pain Associated symptoms include constipation. Pertinent negatives include no diarrhea, fever, nausea or vomiting.    CURRENT THERAPY: Upcoming Treatment Dates - LUNG SMALL CELL Carboplatin D1 / Etoposide D1-3 q21d Days with orders from any treatment category:  07/27/2014      SCHEDULING COMMUNICATION      ondansetron (ZOFRAN) IVPB 16 mg      Dexamethasone Sodium Phosphate (DECADRON) injection 20 mg      CARBOplatin CHEMO intradermal Test Dose 100 mcg/0.25ml      CARBOplatin (PARAPLATIN) in sodium chloride 0.9 % 100 mL chemo infusion      etoposide (VEPESID) 180 mg in  sodium chloride 0.9 % 500 mL chemo infusion      sodium chloride 0.9 % injection 10 mL      heparin lock flush 100 unit/mL      heparin lock flush 100 unit/mL      alteplase (CATHFLO ACTIVASE) injection 2 mg      sodium chloride 0.9 % injection 3 mL      Hot Pack 1 packet      0.9 %  sodium chloride infusion      TREATMENT CONDITIONS 07/28/2014      SCHEDULING COMMUNICATION      prochlorperazine (COMPAZINE) tablet 10 mg      etoposide (VEPESID) 180 mg in sodium chloride 0.9 % 500 mL chemo infusion      sodium chloride 0.9 % injection 10 mL      heparin lock flush 100 unit/mL      heparin lock flush 100 unit/mL      alteplase (CATHFLO ACTIVASE) injection 2 mg      sodium chloride 0.9 % injection 3 mL      Hot Pack 1 packet      0.9 %  sodium chloride infusion 07/29/2014      SCHEDULING COMMUNICATION      prochlorperazine (COMPAZINE) tablet 10 mg      etoposide (VEPESID) 180 mg in sodium chloride 0.9 % 500 mL chemo infusion      sodium chloride 0.9 % injection 10 mL      heparin lock flush 100 unit/mL      heparin lock flush 100  unit/mL      alteplase (CATHFLO ACTIVASE) injection 2 mg      sodium chloride 0.9 % injection 3 mL      Hot Pack 1 packet      0.9 %  sodium chloride infusion    Review of Systems  Constitutional: Positive for malaise/fatigue. Negative for fever and chills.  HENT: Negative.   Eyes: Negative.   Respiratory: Negative.   Cardiovascular: Negative.   Gastrointestinal: Positive for abdominal pain and constipation. Negative for nausea, vomiting, diarrhea and blood in stool.  Genitourinary: Negative.   Musculoskeletal: Negative.   Skin: Negative.   Neurological: Negative.   Endo/Heme/Allergies: Negative.   Psychiatric/Behavioral: Negative.   All other systems reviewed and are negative.   Past Medical History  Diagnosis Date  . Cancer     lung ca dx'd 04/2013  . Cancer 11/30/13 ct    rt para renal mass metastatic  . History of radiation therapy  01/07/14-01/26/14    abdomen/30 Gy/30fx  . Hx of radiation therapy 04/05/14-04/09/14    palliative left femur, 20Gy    History reviewed. No pertinent past surgical history.  has Malignant neoplasm of bronchus and lung, unspecified site; Delirium; Agitation; Hypokalemia; Pancytopenia; Secondary malignant neoplasm of bone and bone marrow; Fatigue; Abdominal pain, unspecified site; and Constipation due to opioid therapy on his problem list.     has No Known Allergies.    Medication List       This list is accurate as of: 08/04/14  3:18 PM.  Always use your most recent med list.               ondansetron 8 MG disintegrating tablet  Commonly known as:  ZOFRAN ODT  Take 1 tablet (8 mg total) by mouth every 8 (eight) hours as needed for nausea or vomiting.     oxyCODONE 5 MG immediate release tablet  Commonly known as:  Oxy IR/ROXICODONE  1-2 tabs PO Q 4 hours PRN pain.     promethazine 25 MG tablet  Commonly known as:  PHENERGAN  Take 25 mg by mouth every 6 (six) hours as needed for nausea or vomiting.         PHYSICAL EXAMINATION Vitals: BP 97/80, HR 80, temp 97.8, sat 100 %     Physical Exam  Nursing note and vitals reviewed. Constitutional: He is oriented to person, place, and time. Vital signs are normal. He appears unhealthy. No distress.  HENT:  Head: Normocephalic and atraumatic.  Eyes: Conjunctivae are normal. Pupils are equal, round, and reactive to light.  Neck: Normal range of motion. Neck supple.  Cardiovascular: Normal rate, regular rhythm and normal heart sounds.   Pulmonary/Chest: Effort normal and breath sounds normal. No respiratory distress.  Abdominal: Soft. Bowel sounds are normal. He exhibits no distension. There is no tenderness. There is no rebound and no guarding.  Musculoskeletal: Normal range of motion. He exhibits tenderness. He exhibits no edema.  Neurological: He is alert and oriented to person, place, and time. Gait normal.  Skin: Skin is warm  and dry. He is not diaphoretic.  Psychiatric: Affect normal.    LABORATORY DATA:. CBC  Lab Results  Component Value Date   WBC 8.9 08/03/2014   RBC 3.33* 08/03/2014   HGB 10.6* 08/03/2014   HCT 32.1* 08/03/2014   PLT 201 08/03/2014   MCV 96.4 08/03/2014   MCH 31.7 08/03/2014   MCHC 32.9 08/03/2014   RDW 21.9* 08/03/2014   LYMPHSABS 0.7* 08/03/2014   MONOABS  0.7 08/03/2014   EOSABS 0.0 08/03/2014   BASOSABS 0.1 08/03/2014     CMET  Lab Results  Component Value Date   NA 136 08/03/2014   K 4.4 08/03/2014   CL 99 08/03/2014   CO2 24 08/03/2014   GLUCOSE 93 08/03/2014   BUN 13.0 08/03/2014   CREATININE 1.0 08/03/2014   CALCIUM 10.0 08/03/2014   PROT 7.2 08/03/2014   ALBUMIN 3.8 08/03/2014   AST 19 08/03/2014   ALT 6 08/03/2014   ALKPHOS 127 08/03/2014   BILITOT 0.34 08/03/2014   GFRNONAA 70* 08/03/2014   GFRAA 81* 08/03/2014     RADIOGRAPHIC STUDIES:EXAM:  CT ABDOMEN AND PELVIS WITH CONTRAST  TECHNIQUE:  Multidetector CT imaging of the abdomen and pelvis was performed  using the standard protocol following bolus administration of  intravenous contrast.  CONTRAST: 128mL OMNIPAQUE IOHEXOL 300 MG/ML SOLN  COMPARISON: 07/05/2014  FINDINGS:  Lung bases are clear. No evidence for free intraperitoneal air.  Again noted is a soft tissue lesion between the liver and right  kidney that measures 2.0 x 2.4 cm and previously measured 2.1 x 2.0  cm. There are multiple low-density lesions in the inferior right  hepatic lobe. Difficult to evaluate for interval progression of the  liver disease because most recent comparison exams were without  contrast. Index lesion measures up to 1.7 cm on sequence 2, image  24. Portal venous system is patent. Normal appearance of the  gallbladder. Normal appearance of the spleen, pancreas and left  adrenal gland. Again noted is a right adrenal lesion which now  measures 2.8 x 2.7 cm and previously measured 2.5 x 2.9 cm. Again  noted is a large right  retroperitoneal lymph node/lesion along the  right side of the IVC that measures 2.9 x 2.0 cm and previously  measured 2.3 x 1.8 cm. There is moderate-severe right hydronephrosis  which is similar to the previous examination. Right hydronephrosis  appears to be secondary to right pericaval node/lesion. Distal right  ureter is decompressed. There is small amount of fluid in the  urinary bladder. Normal appearance of the left kidney.  Mild distention of the stomach. There is gaseous distension of the  transverse colon which also contains a moderate amount of stool. No  significant small bowel dilatation. No evidence for free fluid in  the pelvis. There is material or structure in the right inguinal  canal and this is not consistent with simple fluid. This could  represent the right testicle. No significant inguinal hernia.  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 No acute  bone abnormality.  IMPRESSION:  There has been mild progression of the metastatic disease in the  abdomen. Patient has multiple liver lesions and mild interval  enlargement of the right retroperitoneal lesions.  Moderate-to-severe right hydronephrosis is similar to the comparison  examination. Right hydronephrosis is due to the pericaval lesion.  There is new material within the right inguinal canal. This may  represent right testicle. Findings are not compatible with simple  fluid. Recommend clinical correlation in this area.  Electronically Signed  By: Markus Daft M.D.  On: 08/03/2014 12:58   ASSESSMENT/PLAN:    Malignant neoplasm of bronchus and lung, unspecified site  Assessment & Plan The patient will proceed today with cycle 4 of his carboplatin/etoposide chemotherapy regimen as previously directed.  He will be scheduled for a Neulasta injection this coming Saturday, 08/07/2014.  CT of the abdomen/pelvis that was obtained while in the emergency department just yesterday  morning did show only a  slight progression of his disease.   Abdominal pain, unspecified site  Assessment & Plan CT scan of the abdomen/pelvis that was obtained per the emergency department yesterday morning did show only slight progression of disease.  Is most likely that patient's continued/worsening abdominal pain is related to both his cancer in some mild to moderate constipation.  Since patient does have some liver involvement-will discontinue both the hydrocodone and the Percocet; and initiate patient on oxycodone 5-10 mg every 4 hours on an as-needed basis.  Carefully reviewed need to discontinue both the Vicodin and Percocet with patient.  The patient written instructions for this provider as well.   Constipation due to opioid therapy  Assessment & Plan Likely, a portion of the patient's worsening abdominal discomfort is related to constipation.  Once again reviewed need for an intense bowel regimen program with the patient.  The patient should take stool softeners on a twice-daily; intake MiraLAX every 3-4 hours until he was cleaned out.  He possibly need to continue with Miralax on a daily basis to keep his bowels regular.   Patient was also given  Dilaudid 2 mg IV with very effective relief of pain while in the infusion Center.  Also, patient is requesting a social work referral so both patient and his daughter can review options for support and advice regarding a patient's plan children's care.  Will place this order.  The patient is also requesting a Port-A-Cath placement; since it is very difficult to obtain peripheral IV placement with this patient.  Disorder will also be placed today.  Patient stated understanding of all instructions; and was in agreement with this plan of care. The patient knows to call the clinic with any problems, questions or concerns.   Review of all with Dr. Julien Nordmann today regarding patient's complaints and plan of action.  Total time spent with patient was 25 minutes;  with  greater than 75 percent of that time spent in face to face counseling regarding his symptoms, review of lab results, review of CT scan results, and coordination of care and follow up.  Disclaimer: This note was dictated with voice recognition software. Similar sounding words can inadvertently be transcribed and may not be corrected upon review.   Drue Second, NP 08/04/2014

## 2014-08-04 NOTE — Assessment & Plan Note (Signed)
Likely, a portion of the patient's worsening abdominal discomfort is related to constipation.  Once again reviewed need for an intense bowel regimen program with the patient.  The patient should take stool softeners on a twice-daily; intake MiraLAX every 3-4 hours until he was cleaned out.  He possibly need to continue with Miralax on a daily basis to keep his bowels regular.

## 2014-08-05 ENCOUNTER — Ambulatory Visit (HOSPITAL_BASED_OUTPATIENT_CLINIC_OR_DEPARTMENT_OTHER): Payer: Medicaid Other

## 2014-08-05 ENCOUNTER — Telehealth: Payer: Self-pay | Admitting: Internal Medicine

## 2014-08-05 ENCOUNTER — Telehealth: Payer: Self-pay | Admitting: Nurse Practitioner

## 2014-08-05 VITALS — BP 115/64 | HR 86 | Temp 97.2°F | Resp 18

## 2014-08-05 DIAGNOSIS — C3410 Malignant neoplasm of upper lobe, unspecified bronchus or lung: Secondary | ICD-10-CM

## 2014-08-05 DIAGNOSIS — C3490 Malignant neoplasm of unspecified part of unspecified bronchus or lung: Secondary | ICD-10-CM

## 2014-08-05 DIAGNOSIS — Z5111 Encounter for antineoplastic chemotherapy: Secondary | ICD-10-CM

## 2014-08-05 MED ORDER — PROCHLORPERAZINE MALEATE 10 MG PO TABS
ORAL_TABLET | ORAL | Status: AC
  Filled 2014-08-05: qty 1

## 2014-08-05 MED ORDER — PROCHLORPERAZINE MALEATE 10 MG PO TABS
10.0000 mg | ORAL_TABLET | Freq: Once | ORAL | Status: AC
  Administered 2014-08-05: 10 mg via ORAL

## 2014-08-05 MED ORDER — SODIUM CHLORIDE 0.9 % IV SOLN
Freq: Once | INTRAVENOUS | Status: AC
  Administered 2014-08-05: 13:00:00 via INTRAVENOUS

## 2014-08-05 MED ORDER — SODIUM CHLORIDE 0.9 % IV SOLN
100.0000 mg/m2 | Freq: Once | INTRAVENOUS | Status: AC
  Administered 2014-08-05: 180 mg via INTRAVENOUS
  Filled 2014-08-05: qty 9

## 2014-08-05 NOTE — Telephone Encounter (Signed)
, °

## 2014-08-05 NOTE — Patient Instructions (Signed)
[facility] Discharge Instructions for Patients Receiving Chemotherapy  Today you received the following chemotherapy agents Etoposide.  To help prevent nausea and vomiting after your treatment, we encourage you to take your nausea medication.   If you develop nausea and vomiting that is not controlled by your nausea medication, call the clinic.   BELOW ARE SYMPTOMS THAT SHOULD BE REPORTED IMMEDIATELY:  *FEVER GREATER THAN 100.5 F  *CHILLS WITH OR WITHOUT FEVER  NAUSEA AND VOMITING THAT IS NOT CONTROLLED WITH YOUR NAUSEA MEDICATION  *UNUSUAL SHORTNESS OF BREATH  *UNUSUAL BRUISING OR BLEEDING  TENDERNESS IN MOUTH AND THROAT WITH OR WITHOUT PRESENCE OF ULCERS  *URINARY PROBLEMS  *BOWEL PROBLEMS  UNUSUAL RASH Items with * indicate a potential emergency and should be followed up as soon as possible.  Feel free to call the clinic you have any questions or concerns. The clinic phone number is [phone].

## 2014-08-06 ENCOUNTER — Ambulatory Visit (HOSPITAL_BASED_OUTPATIENT_CLINIC_OR_DEPARTMENT_OTHER): Payer: Medicaid Other

## 2014-08-06 ENCOUNTER — Ambulatory Visit: Payer: Medicaid Other

## 2014-08-06 ENCOUNTER — Ambulatory Visit (INDEPENDENT_AMBULATORY_CARE_PROVIDER_SITE_OTHER): Payer: Medicaid Other | Admitting: General Surgery

## 2014-08-06 DIAGNOSIS — C3490 Malignant neoplasm of unspecified part of unspecified bronchus or lung: Secondary | ICD-10-CM

## 2014-08-06 DIAGNOSIS — Z5111 Encounter for antineoplastic chemotherapy: Secondary | ICD-10-CM

## 2014-08-06 MED ORDER — PROCHLORPERAZINE MALEATE 10 MG PO TABS
10.0000 mg | ORAL_TABLET | Freq: Once | ORAL | Status: AC
  Administered 2014-08-06: 10 mg via ORAL

## 2014-08-06 MED ORDER — ETOPOSIDE CHEMO INJECTION 1 GM/50ML
100.0000 mg/m2 | Freq: Once | INTRAVENOUS | Status: AC
  Administered 2014-08-06: 180 mg via INTRAVENOUS
  Filled 2014-08-06: qty 9

## 2014-08-06 MED ORDER — SODIUM CHLORIDE 0.9 % IV SOLN
Freq: Once | INTRAVENOUS | Status: AC
  Administered 2014-08-06: 13:00:00 via INTRAVENOUS

## 2014-08-06 MED ORDER — PROCHLORPERAZINE MALEATE 10 MG PO TABS
ORAL_TABLET | ORAL | Status: AC
  Filled 2014-08-06: qty 1

## 2014-08-06 NOTE — Patient Instructions (Signed)
[facility] Discharge Instructions for Patients Receiving Chemotherapy  Today you received the following chemotherapy agents Etoposide  To help prevent nausea and vomiting after your treatment, we encourage you to take your nausea medication Compazine   If you develop nausea and vomiting that is not controlled by your nausea medication, call the clinic.   BELOW ARE SYMPTOMS THAT SHOULD BE REPORTED IMMEDIATELY:  *FEVER GREATER THAN 100.5 F  *CHILLS WITH OR WITHOUT FEVER  NAUSEA AND VOMITING THAT IS NOT CONTROLLED WITH YOUR NAUSEA MEDICATION  *UNUSUAL SHORTNESS OF BREATH  *UNUSUAL BRUISING OR BLEEDING  TENDERNESS IN MOUTH AND THROAT WITH OR WITHOUT PRESENCE OF ULCERS  *URINARY PROBLEMS  *BOWEL PROBLEMS  UNUSUAL RASH Items with * indicate a potential emergency and should be followed up as soon as possible.  Feel free to call the clinic you have any questions or concerns. The clinic phone number is [phone].

## 2014-08-07 ENCOUNTER — Ambulatory Visit (HOSPITAL_BASED_OUTPATIENT_CLINIC_OR_DEPARTMENT_OTHER): Payer: Medicaid Other

## 2014-08-07 VITALS — BP 118/73 | HR 96 | Temp 97.5°F | Resp 20

## 2014-08-07 DIAGNOSIS — Z5189 Encounter for other specified aftercare: Secondary | ICD-10-CM

## 2014-08-07 DIAGNOSIS — C3490 Malignant neoplasm of unspecified part of unspecified bronchus or lung: Secondary | ICD-10-CM

## 2014-08-07 MED ORDER — PEGFILGRASTIM INJECTION 6 MG/0.6ML
6.0000 mg | Freq: Once | SUBCUTANEOUS | Status: AC
  Administered 2014-08-07: 6 mg via SUBCUTANEOUS

## 2014-08-07 NOTE — Patient Instructions (Signed)
Pegfilgrastim injection What is this medicine? PEGFILGRASTIM (peg fil GRA stim) is a long-acting granulocyte colony-stimulating factor that stimulates the growth of neutrophils, a type of white blood cell important in the body's fight against infection. It is used to reduce the incidence of fever and infection in patients with certain types of cancer who are receiving chemotherapy that affects the bone marrow. This medicine may be used for other purposes; ask your health care provider or pharmacist if you have questions. COMMON BRAND NAME(S): Neulasta What should I tell my health care provider before I take this medicine? They need to know if you have any of these conditions: -latex allergy -ongoing radiation therapy -sickle cell disease -skin reactions to acrylic adhesives (On-Body Injector only) -an unusual or allergic reaction to pegfilgrastim, filgrastim, other medicines, foods, dyes, or preservatives -pregnant or trying to get pregnant -breast-feeding How should I use this medicine? This medicine is for injection under the skin. If you get this medicine at home, you will be taught how to prepare and give the pre-filled syringe or how to use the On-body Injector. Refer to the patient Instructions for Use for detailed instructions. Use exactly as directed. Take your medicine at regular intervals. Do not take your medicine more often than directed. It is important that you put your used needles and syringes in a special sharps container. Do not put them in a trash can. If you do not have a sharps container, call your pharmacist or healthcare provider to get one. Talk to your pediatrician regarding the use of this medicine in children. Special care may be needed. Overdosage: If you think you have taken too much of this medicine contact a poison control center or emergency room at once. NOTE: This medicine is only for you. Do not share this medicine with others. What if I miss a dose? It is  important not to miss your dose. Call your doctor or health care professional if you miss your dose. If you miss a dose due to an On-body Injector failure or leakage, a new dose should be administered as soon as possible using a single prefilled syringe for manual use. What may interact with this medicine? Interactions have not been studied. Give your health care provider a list of all the medicines, herbs, non-prescription drugs, or dietary supplements you use. Also tell them if you smoke, drink alcohol, or use illegal drugs. Some items may interact with your medicine. This list may not describe all possible interactions. Give your health care provider a list of all the medicines, herbs, non-prescription drugs, or dietary supplements you use. Also tell them if you smoke, drink alcohol, or use illegal drugs. Some items may interact with your medicine. What should I watch for while using this medicine? You may need blood work done while you are taking this medicine. If you are going to need a MRI, CT scan, or other procedure, tell your doctor that you are using this medicine (On-Body Injector only). What side effects may I notice from receiving this medicine? Side effects that you should report to your doctor or health care professional as soon as possible: -allergic reactions like skin rash, itching or hives, swelling of the face, lips, or tongue -dizziness -fever -pain, redness, or irritation at site where injected -pinpoint red spots on the skin -shortness of breath or breathing problems -stomach or side pain, or pain at the shoulder -swelling -tiredness -trouble passing urine Side effects that usually do not require medical attention (report to your doctor   or health care professional if they continue or are bothersome): -bone pain -muscle pain This list may not describe all possible side effects. Call your doctor for medical advice about side effects. You may report side effects to FDA at  1-800-FDA-1088. Where should I keep my medicine? Keep out of the reach of children. Store pre-filled syringes in a refrigerator between 2 and 8 degrees C (36 and 46 degrees F). Do not freeze. Keep in carton to protect from light. Throw away this medicine if it is left out of the refrigerator for more than 48 hours. Throw away any unused medicine after the expiration date. NOTE: This sheet is a summary. It may not cover all possible information. If you have questions about this medicine, talk to your doctor, pharmacist, or health care provider.  2015, [employer] Standard. (2014-03-04 16:14:05)  

## 2014-08-12 ENCOUNTER — Other Ambulatory Visit: Payer: Self-pay | Admitting: Internal Medicine

## 2014-08-13 ENCOUNTER — Telehealth: Payer: Self-pay | Admitting: Internal Medicine

## 2014-08-13 NOTE — Telephone Encounter (Signed)
lvm for pt regarding to  Sept appt.....mailed pt appt sched adn letter...sed added tx.

## 2014-08-24 ENCOUNTER — Telehealth: Payer: Self-pay | Admitting: Internal Medicine

## 2014-08-24 NOTE — Telephone Encounter (Signed)
returned pt call no vm no answer...Marland KitchenMarland Kitchen

## 2014-08-25 ENCOUNTER — Ambulatory Visit (HOSPITAL_BASED_OUTPATIENT_CLINIC_OR_DEPARTMENT_OTHER): Payer: Medicaid Other | Admitting: Nurse Practitioner

## 2014-08-25 ENCOUNTER — Other Ambulatory Visit (HOSPITAL_BASED_OUTPATIENT_CLINIC_OR_DEPARTMENT_OTHER): Payer: Medicaid Other

## 2014-08-25 ENCOUNTER — Ambulatory Visit: Payer: Medicaid Other

## 2014-08-25 ENCOUNTER — Telehealth: Payer: Self-pay | Admitting: Internal Medicine

## 2014-08-25 VITALS — BP 90/50 | HR 91 | Temp 97.5°F | Resp 18 | Ht 70.0 in | Wt 129.8 lb

## 2014-08-25 DIAGNOSIS — C3490 Malignant neoplasm of unspecified part of unspecified bronchus or lung: Secondary | ICD-10-CM

## 2014-08-25 DIAGNOSIS — C772 Secondary and unspecified malignant neoplasm of intra-abdominal lymph nodes: Secondary | ICD-10-CM

## 2014-08-25 DIAGNOSIS — C3410 Malignant neoplasm of upper lobe, unspecified bronchus or lung: Secondary | ICD-10-CM

## 2014-08-25 DIAGNOSIS — D696 Thrombocytopenia, unspecified: Secondary | ICD-10-CM

## 2014-08-25 DIAGNOSIS — R109 Unspecified abdominal pain: Secondary | ICD-10-CM

## 2014-08-25 LAB — CBC WITH DIFFERENTIAL/PLATELET
BASO%: 0.3 % (ref 0.0–2.0)
Basophils Absolute: 0 10*3/uL (ref 0.0–0.1)
EOS ABS: 0 10*3/uL (ref 0.0–0.5)
EOS%: 0.3 % (ref 0.0–7.0)
HEMATOCRIT: 25.7 % — AB (ref 38.4–49.9)
HGB: 8.4 g/dL — ABNORMAL LOW (ref 13.0–17.1)
LYMPH%: 13.1 % — AB (ref 14.0–49.0)
MCH: 32.4 pg (ref 27.2–33.4)
MCHC: 32.7 g/dL (ref 32.0–36.0)
MCV: 99.2 fL — AB (ref 79.3–98.0)
MONO#: 1.3 10*3/uL — ABNORMAL HIGH (ref 0.1–0.9)
MONO%: 11.1 % (ref 0.0–14.0)
NEUT#: 8.8 10*3/uL — ABNORMAL HIGH (ref 1.5–6.5)
NEUT%: 75.2 % — AB (ref 39.0–75.0)
PLATELETS: 75 10*3/uL — AB (ref 140–400)
RBC: 2.59 10*6/uL — AB (ref 4.20–5.82)
RDW: 18.1 % — ABNORMAL HIGH (ref 11.0–14.6)
WBC: 11.7 10*3/uL — ABNORMAL HIGH (ref 4.0–10.3)
lymph#: 1.5 10*3/uL (ref 0.9–3.3)

## 2014-08-25 LAB — COMPREHENSIVE METABOLIC PANEL (CC13)
ALK PHOS: 153 U/L — AB (ref 40–150)
ALT: 8 U/L (ref 0–55)
ANION GAP: 11 meq/L (ref 3–11)
AST: 15 U/L (ref 5–34)
Albumin: 3.5 g/dL (ref 3.5–5.0)
BILIRUBIN TOTAL: 0.23 mg/dL (ref 0.20–1.20)
BUN: 15.2 mg/dL (ref 7.0–26.0)
CO2: 25 mEq/L (ref 22–29)
Calcium: 9.6 mg/dL (ref 8.4–10.4)
Chloride: 106 mEq/L (ref 98–109)
Creatinine: 1.2 mg/dL (ref 0.7–1.3)
GLUCOSE: 130 mg/dL (ref 70–140)
Potassium: 3.7 mEq/L (ref 3.5–5.1)
SODIUM: 142 meq/L (ref 136–145)
TOTAL PROTEIN: 6.9 g/dL (ref 6.4–8.3)

## 2014-08-25 MED ORDER — PROMETHAZINE HCL 25 MG PO TABS: 25.0000 mg | ORAL_TABLET | Freq: Four times a day (QID) | ORAL | Status: DC | PRN

## 2014-08-25 MED ORDER — OXYCODONE HCL 5 MG PO TABS
ORAL_TABLET | ORAL | Status: DC
Start: 2014-08-25 — End: 2014-09-01

## 2014-08-25 NOTE — Telephone Encounter (Signed)
Pt confirmed labs/ov per 09/09 POF, sent msg to r/s chemo, cld referral for Dr. Arletha Pili apt set for 09/17 @2pm  will mail updated sch to pt once chemo is changed and pt wants add/# for referral....KJ

## 2014-08-25 NOTE — Progress Notes (Addendum)
Jacob [facility] OFFICE PROGRESS NOTE   Diagnosis:  Extensive stage small cell lung cancer.  INTERVAL HISTORY:   Mr. Jacob Webb returns as scheduled. He completed cycle 4 carboplatin/etoposide beginning 08/04/2014. He denies nausea/vomiting. No mouth sores. No diarrhea. He has a poor energy level. He has stable dyspnea on exertion. No fevers. He continues to have pain at the right flank/low back region. He takes oxycodone as needed.  Objective:  Vital signs in last 24 hours:  Blood pressure 90/50, pulse 91, temperature 97.5 F (36.4 C), temperature source Oral, resp. rate 18, height 5\' 10"  (1.778 m), weight 129 lb 12.8 oz (58.877 kg), SpO2 100.00%.    HEENT: No thrush or ulcers. Resp: Lungs clear bilaterally. Cardio: Regular rate and rhythm. GI: Abdomen soft and nontender. No hepatomegaly. Vascular: No leg edema. Neuro: Motor strength 5 over 5. Knee DTRs 2+, symmetric.  Skin: Ecchymoses scattered over the forearms.    Lab Results:  Lab Results  Component Value Date   WBC 11.7* 08/25/2014   HGB 8.4* 08/25/2014   HCT 25.7* 08/25/2014   MCV 99.2* 08/25/2014   PLT 75* 08/25/2014   NEUTROABS 8.8* 08/25/2014    Imaging:  No results found.  Medications: I have reviewed the patient's current medications.  Assessment/Plan: 1. Extensive stage small cell lung cancer initially diagnosed May 2014.  Status post whole brain radiation.  Status post palliative radiation to the chest.  He completed 5 cycles of carboplatin/etoposide 05/25/2013 through September 2014.  Palliative radiation to abdominal metastasis adjacent to the right kidney 01/07/2014 through 01/26/2014.  Palliative radiation to the left femur April 2015.  Restaging CT evaluation 05/05/2014 with further decrease in the size of a left upper lobe pulmonary nodule. Soft tissue fullness about the left suprahilar region favored to be treatment-related. Progression of retroperitoneal metastasis. Progressive right and developing  left-sided retroperitoneal nodes/nodules. Development of right-sided moderate hydroureteronephrosis secondary to retroperitoneal nodal metastasis.  Initiation of carboplatin/etoposide 05/18/2014. Cycle 2 carboplatin/etoposide 06/15/2014. Restaging CT evaluation 07/05/2014 with stable disease. Cycle 3 carboplatin/etoposide 07/06/2014.  Cycle 4 carboplatin/etoposide 08/04/2014. 2. Right flank pain. Question due to hydronephrosis, question retroperitoneal adenopathy. 3. Thrombocytopenia.   Disposition: Mr. Jacob Webb appears unchanged. He has completed 4 cycles of carboplatin/etoposide. Cycle 5 will be held today and rescheduled for one week due to thrombocytopenia.  He continues to have right flank pain. We made a referral to urology a few months ago. He was unable to keep the appointment. We gave him the number to the Urology Center to call to reschedule.  He will return for cycle 5 carboplatin/etoposide 09/01/2014. We will see him in followup on 09/22/2014. He will contact the office in the interim with any problems.  Patient seen with Dr. Julien Nordmann.  [name] ANP/GNP-BC   08/25/2014  2:35 PM  ADDENDUM: Hematology/Oncology Attending: I had a face to face encounter with the patient. I recommended his care plan. This is a very pleasant 63 years old white male with extensive stage small cell lung cancer who is undergoing second course of treatment with carboplatin and etoposide status post 4 cycles. He was supposed to start cycle #5 today but his platelets count are low. We will delay the start of cycle #5 x 1 week until recovery of his platelets count. The patient continues to have right flank pain and he missed his appointment with urology. I recommended for him to contact the urology office to reschedule his appointment. Would see him back for followup visit in one month's for reevaluation before  starting cycle #6. He was advised to call immediately if he has any concerning symptoms in the  interval.   Disclaimer: This note was dictated with voice recognition software. Similar sounding words can inadvertently be transcribed and may be missed upon review. [name]., MD 08/25/2014

## 2014-08-26 ENCOUNTER — Ambulatory Visit: Payer: Medicaid Other

## 2014-08-26 ENCOUNTER — Telehealth: Payer: Self-pay | Admitting: *Deleted

## 2014-08-26 ENCOUNTER — Telehealth: Payer: Self-pay | Admitting: Internal Medicine

## 2014-08-26 NOTE — Telephone Encounter (Signed)
Faxed pt medical records to Dr. Arletha Pili

## 2014-08-26 NOTE — Telephone Encounter (Signed)
Per staff message and POF I have scheduled appts. Advised scheduler of appts. JMW  

## 2014-08-27 ENCOUNTER — Ambulatory Visit: Payer: Medicaid Other

## 2014-08-28 ENCOUNTER — Ambulatory Visit: Payer: Medicaid Other

## 2014-08-31 ENCOUNTER — Other Ambulatory Visit: Payer: Self-pay | Admitting: *Deleted

## 2014-08-31 ENCOUNTER — Telehealth: Payer: Self-pay | Admitting: Physician Assistant

## 2014-08-31 NOTE — Telephone Encounter (Signed)
per pof to add lab prior to appt 9/16-cld pt & left a message that appt @2 :30 for labs

## 2014-09-01 ENCOUNTER — Other Ambulatory Visit: Payer: Medicaid Other

## 2014-09-01 ENCOUNTER — Emergency Department (HOSPITAL_COMMUNITY): Payer: Medicaid Other

## 2014-09-01 ENCOUNTER — Telehealth: Payer: Self-pay | Admitting: *Deleted

## 2014-09-01 ENCOUNTER — Encounter (HOSPITAL_COMMUNITY): Payer: Self-pay | Admitting: Emergency Medicine

## 2014-09-01 ENCOUNTER — Ambulatory Visit: Payer: Medicaid Other

## 2014-09-01 ENCOUNTER — Emergency Department (HOSPITAL_COMMUNITY)
Admission: EM | Admit: 2014-09-01 | Discharge: 2014-09-01 | Disposition: A | Discharge status: Home / Self Care | Payer: Medicaid Other | Attending: Emergency Medicine | Admitting: Emergency Medicine

## 2014-09-01 DIAGNOSIS — C787 Secondary malignant neoplasm of liver and intrahepatic bile duct: Secondary | ICD-10-CM | POA: Insufficient documentation

## 2014-09-01 DIAGNOSIS — Y92009 Unspecified place in unspecified non-institutional (private) residence as the place of occurrence of the external cause: Secondary | ICD-10-CM | POA: Insufficient documentation

## 2014-09-01 DIAGNOSIS — R443 Hallucinations, unspecified: Secondary | ICD-10-CM | POA: Diagnosis not present

## 2014-09-01 DIAGNOSIS — W19XXXA Unspecified fall, initial encounter: Secondary | ICD-10-CM

## 2014-09-01 DIAGNOSIS — M47817 Spondylosis without myelopathy or radiculopathy, lumbosacral region: Secondary | ICD-10-CM | POA: Diagnosis not present

## 2014-09-01 DIAGNOSIS — F17200 Nicotine dependence, unspecified, uncomplicated: Secondary | ICD-10-CM | POA: Insufficient documentation

## 2014-09-01 DIAGNOSIS — J0100 Acute maxillary sinusitis, unspecified: Secondary | ICD-10-CM | POA: Diagnosis not present

## 2014-09-01 DIAGNOSIS — Y9389 Activity, other specified: Secondary | ICD-10-CM | POA: Insufficient documentation

## 2014-09-01 DIAGNOSIS — M47816 Spondylosis without myelopathy or radiculopathy, lumbar region: Secondary | ICD-10-CM

## 2014-09-01 DIAGNOSIS — R296 Repeated falls: Secondary | ICD-10-CM | POA: Insufficient documentation

## 2014-09-01 DIAGNOSIS — Z923 Personal history of irradiation: Secondary | ICD-10-CM | POA: Diagnosis not present

## 2014-09-01 DIAGNOSIS — IMO0002 Reserved for concepts with insufficient information to code with codable children: Secondary | ICD-10-CM | POA: Insufficient documentation

## 2014-09-01 DIAGNOSIS — C799 Secondary malignant neoplasm of unspecified site: Secondary | ICD-10-CM

## 2014-09-01 LAB — COMPREHENSIVE METABOLIC PANEL
ALT: 12 U/L (ref 0–53)
AST: 23 U/L (ref 0–37)
Albumin: 3.6 g/dL (ref 3.5–5.2)
Alkaline Phosphatase: 148 U/L — ABNORMAL HIGH (ref 39–117)
Anion gap: 10 (ref 5–15)
BUN: 14 mg/dL (ref 6–23)
CALCIUM: 10.1 mg/dL (ref 8.4–10.5)
CHLORIDE: 102 meq/L (ref 96–112)
CO2: 27 mEq/L (ref 19–32)
Creatinine, Ser: 1.03 mg/dL (ref 0.50–1.35)
GFR calc non Af Amer: 76 mL/min — ABNORMAL LOW (ref >90)
GFR, EST AFRICAN AMERICAN: 88 mL/min — AB (ref >90)
GLUCOSE: 85 mg/dL (ref 70–99)
Potassium: 4.1 mEq/L (ref 3.7–5.3)
Sodium: 139 mEq/L (ref 137–147)
Total Bilirubin: 0.2 mg/dL — ABNORMAL LOW (ref 0.3–1.2)
Total Protein: 7.5 g/dL (ref 6.0–8.3)

## 2014-09-01 LAB — URINALYSIS, ROUTINE W REFLEX MICROSCOPIC
BILIRUBIN URINE: NEGATIVE
Glucose, UA: NEGATIVE mg/dL
HGB URINE DIPSTICK: NEGATIVE
Ketones, ur: NEGATIVE mg/dL
Leukocytes, UA: NEGATIVE
Nitrite: NEGATIVE
PH: 7 (ref 5.0–8.0)
Protein, ur: NEGATIVE mg/dL
Specific Gravity, Urine: 1.016 (ref 1.005–1.030)
UROBILINOGEN UA: 1 mg/dL (ref 0.0–1.0)

## 2014-09-01 LAB — CBC WITH DIFFERENTIAL/PLATELET
Basophils Absolute: 0.1 10*3/uL (ref 0.0–0.1)
Basophils Relative: 1 % (ref 0–1)
EOS PCT: 0 % (ref 0–5)
Eosinophils Absolute: 0 10*3/uL (ref 0.0–0.7)
HEMATOCRIT: 26.4 % — AB (ref 39.0–52.0)
HEMOGLOBIN: 8.5 g/dL — AB (ref 13.0–17.0)
LYMPHS ABS: 0.8 10*3/uL (ref 0.7–4.0)
Lymphocytes Relative: 9 % — ABNORMAL LOW (ref 12–46)
MCH: 32.7 pg (ref 26.0–34.0)
MCHC: 32.2 g/dL (ref 30.0–36.0)
MCV: 101.5 fL — AB (ref 78.0–100.0)
MONOS PCT: 13 % — AB (ref 3–12)
Monocytes Absolute: 1.2 10*3/uL — ABNORMAL HIGH (ref 0.1–1.0)
Neutro Abs: 7 10*3/uL (ref 1.7–7.7)
Neutrophils Relative %: 77 % (ref 43–77)
Platelets: 188 10*3/uL (ref 150–400)
RBC: 2.6 MIL/uL — ABNORMAL LOW (ref 4.22–5.81)
RDW: 19.7 % — ABNORMAL HIGH (ref 11.5–15.5)
WBC: 9.1 10*3/uL (ref 4.0–10.5)

## 2014-09-01 LAB — RAPID URINE DRUG SCREEN, HOSP PERFORMED
Amphetamines: POSITIVE — AB
BARBITURATES: NOT DETECTED
BENZODIAZEPINES: NOT DETECTED
COCAINE: NOT DETECTED
Opiates: NOT DETECTED
TETRAHYDROCANNABINOL: NOT DETECTED

## 2014-09-01 LAB — ETHANOL

## 2014-09-01 LAB — LIPASE, BLOOD: LIPASE: 30 U/L (ref 11–59)

## 2014-09-01 LAB — AMMONIA: Ammonia: 20 umol/L (ref 11–60)

## 2014-09-01 MED ORDER — AMOXICILLIN-POT CLAVULANATE 875-125 MG PO TABS: 1.0000 | ORAL_TABLET | Freq: Two times a day (BID) | ORAL | Status: DC

## 2014-09-01 MED ORDER — ONDANSETRON HCL 4 MG/2ML IJ SOLN
4.0000 mg | INTRAMUSCULAR | Status: DC | PRN
  Administered 2014-09-01: 4 mg via INTRAVENOUS
  Filled 2014-09-01: qty 2

## 2014-09-01 MED ORDER — GADOBENATE DIMEGLUMINE 529 MG/ML IV SOLN
10.0000 mL | Freq: Once | INTRAVENOUS | Status: AC | PRN
  Administered 2014-09-01: 10 mL via INTRAVENOUS

## 2014-09-01 MED ORDER — FENTANYL CITRATE 0.05 MG/ML IJ SOLN
100.0000 ug | INTRAMUSCULAR | Status: DC | PRN
  Administered 2014-09-01: 100 ug via INTRAVENOUS
  Filled 2014-09-01: qty 2

## 2014-09-01 MED ORDER — SODIUM CHLORIDE 0.9 % IV SOLN
INTRAVENOUS | Status: DC
  Administered 2014-09-01: 20:00:00 via INTRAVENOUS

## 2014-09-01 MED ORDER — SODIUM CHLORIDE 0.9 % IV BOLUS (SEPSIS)
500.0000 mL | Freq: Once | INTRAVENOUS | Status: AC
  Administered 2014-09-01: 500 mL via INTRAVENOUS

## 2014-09-01 NOTE — Progress Notes (Signed)
CSW was requested by nursing staff to assist the patient with transportation home.  CSW met with the patient at bedside to complete this assessment.  Patient was asleep but easily aroused with verbal cues.  Patient is alert, oriented x3, calm, and cooperative.  Patient reports that he lives in [place], [place] with his older daughter and grandchildren.  Patient provided his daughter's [employer] number [phone] and CSW left a message.  He reports that his daughter is not feeling well and they do not have the gas to come to pick him up after discharge.  Patient reports his other daughter lives in [place], [place] and his unable to pick him up.  The nursing staff reports that the patient's son was here but the patient reports that the son is going to a shelter for the night and unable to help with transportation.    Dr. Alvino Chapel reports that he will do a MRI to see if the patient has new mass on the brain to explain the hallucinations, dizziness, and recent falls.  CSW will continue to follow up after test are completed.    CSW spoke with [facility] about referral for [facility].    10:25pm-CSW left a message for Vernie Shanks and [place] workers in the Cancer center to follow up with the patient and set up medicaid transportation and assess for other needs.  CSW did not receive any return calls from the patient's family about transportation home therefore a taxi voucher was given.     Chesley Noon, MSW, [place], 09/01/2014 Evening Clinical Social Worker [phone]

## 2014-09-01 NOTE — ED Notes (Signed)
Per PTAR pt comes from home for frequent falls over the past 3-4 days. Pt was supposed to go to chemo today at [facility] but was advised over the phone by them was to come to ED.  BP 100/60, HR 68, O2 95% on room air.  Pt did ambulate out to the stretcher with pretty steady gait per EMS.  Pt also c/o lower back pain, abd pain that he was seen for in August.  Per EMS there is no power at the patient's house.  Pt's lung cancer has metastasized and pt does currently still smoke.

## 2014-09-01 NOTE — ED Notes (Signed)
[facility] called for taxi. Patient given taxi voucher.

## 2014-09-01 NOTE — Progress Notes (Signed)
  CARE MANAGEMENT ED NOTE 09/01/2014  Patient:  Jacob Webb, Jacob Webb   Account Number:  [id number]  Date Initiated:  09/01/2014  Documentation initiated by:  Livia Snellen  Subjective/Objective Assessment:   Patient presents to Ed with visual hallucinations and frequent falls.     Subjective/Objective Assessment Detail:     Action/Plan:   Action/Plan Detail:   Anticipated DC Date:       Status Recommendation to Physician:   Result of Recommendation:    Other ED Services  Consult Working [place]  CM consult  Other  PCP issues   [facility] Choice  HOME HEALTH   Choice offered to / List presented to:  C-1 Patient         [place].    Status of service:  Completed, signed off  ED Comments:   ED Comments Detail:  EDCM spoke to patient at bedside. Patient awake alert, answering questions appropriately. Patient reports he lives at home with his daughter and three grand children. Patient reports his daughter is with him all the time. Patient confirms he does not have a pcp but sees Dr. Aundra Dubin for cancer treatment.  Patient reports he missed his appointment with Dr. Earlie Server recently as he did not have a way there.  Patient reports his daughter is currently not driving.  Patient reports he performs his ADL's without difficulty.  Patient reports he is able to walk.  EDCM questioned patient about recent falls and asked if he has a walker at home?  Patient reports he has a walking stick at home that helps him with his balance. Patient refuses a walker currently.  Patient agreeable to allow [facility] to contact SW at cancer center to update on transportation issues.  Patient is agreeable to home health RN.  Patient is not a candidate for [facility].  Discussed patient with EDP. Awaiting MRI of brain.  [facility] provided patient with list of home health agencies in [place] highlighting [place].  EDCM explained to patient that home health agency has 24-48  hours to contact him. EDCM also provided patient with list of private duty nursing agencies if needed.  Patient reports the best phone number to reach him is his cell phone [phone]. Patient thankful for resources.  No further EDCM needs at this time.

## 2014-09-01 NOTE — ED Notes (Addendum)
Per pt's son, pt has fallen a couple of times over the past couple of days. Also states that he has been having hallucinations of people and objects that arent there.  Also the other day pt was supposed to be outside getting fresh air and smoking, when the family couldn't find the patient.  The had to call emergency responders and ended up finding pt at the neighbors house by a bird feeder.  Pt told EMS that he thought he was in [place].   Pt's son states that his sister feels he has dementia. Pt denies having a PCP.

## 2014-09-01 NOTE — ED Provider Notes (Signed)
  Physical Exam  BP 114/65  Pulse 78  Temp(Src) 97.8 F (36.6 C) (Oral)  Resp 16  SpO2 100%  Physical Exam  ED Course  Procedures  MDM Received patient in signout. Has had some hallucinations and some falls at home. Is on chemotherapy. Known hepatic metastasis. No fevers. No hallucinations here. Has been seen by social work and case management. Has sinusitis on MRI done to rule out metastatic disease. Will give antibiotics.      Jasper Riling. Alvino Chapel, MD 09/01/14 2239

## 2014-09-01 NOTE — ED Notes (Signed)
Patient transported to MRI 

## 2014-09-01 NOTE — Progress Notes (Signed)
09/01/2014 A. Eliza Green RNCM 2245pm Patient to be discharged.  Orders placed for home health RN for home safety evaluation with face to face. Patient without medical need for admission per EDP.  EDCM left voice mail with Polo Riley SW at cancer center at [facility] to follow up with patient regarding, transportation issues, financial issues,  goals of care. [facility] at 2313pm with confirmation of receipt at 2316pm    No further [facility] needs at this time.

## 2014-09-01 NOTE — ED Notes (Signed)
Patient ambulated independently to restroom.

## 2014-09-01 NOTE — ED Provider Notes (Signed)
CSN: [id number]     Arrival date & time 09/01/14  1243 History   First MD Initiated Contact with Patient 09/01/14 1328     Chief Complaint  Patient presents with  . frequent falls   . Hallucinations  . Back Pain     (Consider location/radiation/quality/duration/timing/severity/associated sxs/prior Treatment) HPI  Jacob Webb is a 63 y.o. male who states that he is here for evaluation of back pain that worsening, since a fall several days ago, and occasionally hallucinating, seeing things,  that are not there. He takes pain medicine, chronically for his back, pain, last dose this morning. He denies, nausea or vomiting. He has had decreased appetite, and weakness for several weeks. He missed his last cycle of chemotherapy secondary to thrombocytopenia. He is getting active treatment for metastatic lung cancer. He has known hepatic metastases. He denies recent fever, chills, cough, chest pain isolated, weakness, or paresthesias. He is taking his medicines as prescribed. There are no other known modifying factors.   Past Medical History  Diagnosis Date  . Cancer     lung ca dx'd 04/2013  . Cancer 11/30/13 ct    rt para renal mass metastatic  . History of radiation therapy 01/07/14-01/26/14    abdomen/30 Gy/22fx  . Hx of radiation therapy 04/05/14-04/09/14    palliative left femur, 20Gy   History reviewed. No pertinent past surgical history. Family History  Problem Relation Age of Onset  . Aneurysm Mother   . COPD Father   . CAD Brother    History  Substance Use Topics  . Smoking status: Current Every Day Smoker -- 2.00 packs/day for 15 years    Types: Cigarettes  . Smokeless tobacco: Never Used  . Alcohol Use: No    Review of Systems  All other systems reviewed and are negative.     Allergies  Review of patient's allergies indicates no known allergies.  Home Medications   Prior to Admission medications   Medication Sig Start Date End Date Taking? Authorizing Provider   ondansetron (ZOFRAN-ODT) 8 MG disintegrating tablet Take 8 mg by mouth every 8 (eight) hours as needed for nausea or vomiting.   Yes Historical Provider, MD  oxyCODONE (OXY IR/ROXICODONE) 5 MG immediate release tablet Take 5-10 mg by mouth every 4 (four) hours as needed for severe pain.   Yes Historical Provider, MD  promethazine (PHENERGAN) 25 MG tablet Take 25 mg by mouth every 6 (six) hours as needed for nausea or vomiting.   Yes Historical Provider, MD   BP 106/70  Pulse 78  Temp(Src) 97.8 F (36.6 C) (Oral)  Resp 15  SpO2 100% Physical Exam  Nursing note and vitals reviewed. Constitutional: He is oriented to person, place, and time. He appears well-developed.  Frail, appears older than stated age.  HENT:  Head: Normocephalic and atraumatic.  Right Ear: External ear normal.  Left Ear: External ear normal.  Eyes: Conjunctivae and EOM are normal. Pupils are equal, round, and reactive to light.  Neck: Normal range of motion and phonation normal. Neck supple.  Cardiovascular: Normal rate, regular rhythm and normal heart sounds.   Pulmonary/Chest: Effort normal and breath sounds normal. No respiratory distress. He exhibits no bony tenderness.  Abdominal: Soft. He exhibits no distension and no mass. There is no tenderness. There is no guarding.  Musculoskeletal: Normal range of motion.  Mild right lower lumbar tenderness; no associated crepitation or deformity.  Neurological: He is alert and oriented to person, place, and time. No cranial nerve deficit  or sensory deficit. He exhibits normal muscle tone. Coordination normal.  He is lucid. No audio or visual hallucinations.  Skin: Skin is warm, dry and intact.  Psychiatric: He has a normal mood and affect. His behavior is normal. Judgment and thought content normal.    ED Course  Procedures (including critical care time) Medications  0.9 %  sodium chloride infusion (not administered)  ondansetron (ZOFRAN) injection 4 mg (4 mg  Intravenous Given 09/01/14 1427)  fentaNYL (SUBLIMAZE) injection 100 mcg (100 mcg Intravenous Given 09/01/14 1427)  sodium chloride 0.9 % bolus 500 mL (500 mLs Intravenous New Bag/Given 09/01/14 1426)    Patient Vitals for the past 24 hrs:  BP Temp Temp src Pulse Resp SpO2  09/01/14 1252 106/70 mmHg 97.8 F (36.6 C) Oral 78 15 100 %    4:01 PM Reevaluation with update and discussion. After initial assessment and treatment, an updated evaluation reveals no change in status. He states no way to get home. Will contact social work and/or [employer] team for assistance. [place] Review Labs Reviewed  CBC WITH DIFFERENTIAL - Abnormal; Notable for the following:    RBC 2.60 (*)    Hemoglobin 8.5 (*)    HCT 26.4 (*)    MCV 101.5 (*)    RDW 19.7 (*)    Lymphocytes Relative 9 (*)    Monocytes Relative 13 (*)    Monocytes Absolute 1.2 (*)    All other components within normal limits  COMPREHENSIVE METABOLIC PANEL - Abnormal; Notable for the following:    Alkaline Phosphatase 148 (*)    Total Bilirubin 0.2 (*)    GFR calc non Af Amer 76 (*)    GFR calc Af Amer 88 (*)    All other components within normal limits  LIPASE, BLOOD  AMMONIA  ETHANOL  URINALYSIS, ROUTINE W REFLEX MICROSCOPIC  URINE RAPID DRUG SCREEN (HOSP PERFORMED)    Imaging Review Dg Lumbar Spine Complete  09/01/2014   CLINICAL DATA:  Status post fall.  Lower back pain.  EXAM: LUMBAR SPINE - COMPLETE 4+ VIEW  COMPARISON:  CT 08/03/2014  FINDINGS: Grade 1 anterior listhesis of L5 on S1. Bilateral L5 pars defects. L5-S1 degenerative disc change. Minimal anterior height loss of T12 vertebral body. Preservation of the remainder of the visualized vertebral body heights. The SI joints are unremarkable. Visualized bowel gas pattern is unremarkable.  IMPRESSION: Mild anterior height loss of the T12 vertebral body. Recommend correlation for point tenderness.  L5-S1 degenerative change with grade 1  anterolisthesis and bilateral L5 pars defects.   Electronically Signed   By: Lovey Newcomer M.D.   On: 09/01/2014 15:28     EKG Interpretation None      MDM   Final diagnoses:  Fall, initial encounter  Spondylosis of lumbar region without myelopathy or radiculopathy  Metastatic cancer    Non specific weakness, and falls. No significant injuries. Doubt metabolic instability or SBI. Apparent poor home situation.  Nursing Notes Reviewed/ Care Coordinated Applicable Imaging Reviewed Interpretation of Laboratory Data incorporated into ED treatment   Plan: as per oncoming provider team    Richarda Blade, MD 09/03/14 [phone]

## 2014-09-01 NOTE — Discharge Instructions (Signed)
Fall Prevention and Home Safety °Falls cause injuries and can affect all age groups. It is possible to prevent falls.  °HOW TO PREVENT FALLS °· Wear shoes with rubber soles that do not have an opening for your toes. °· Keep the inside and outside of your house well lit. °· Use night lights throughout your home. °· Remove clutter from floors. °· Clean up floor spills. °· Remove throw rugs or fasten them to the floor with carpet tape. °· Do not place electrical cords across pathways. °· Put grab bars by your tub, shower, and toilet. Do not use towel bars as grab bars. °· Put handrails on both sides of the stairway. Fix loose handrails. °· Do not climb on stools or stepladders, if possible. °· Do not wax your floors. °· Repair uneven or unsafe sidewalks, walkways, or stairs. °· Keep items you use a lot within reach. °· Be aware of pets. °· Keep emergency numbers next to the telephone. °· Put smoke detectors in your home and near bedrooms. °Ask your doctor what other things you can do to prevent falls. °Document Released: 09/29/2009 Document Revised: 06/03/2012 Document Reviewed: 03/04/2012 °ExitCare® Patient Information ©2015 [employer]. This information is not intended to replace advice given to you by your health care provider. Make sure you discuss any questions you have with your health care provider. ° °

## 2014-09-01 NOTE — Telephone Encounter (Signed)
Pt called and left a vm that he was seeing things that were not there and has fallen 4-5 times.  Per Dr Vista Mink, pt needs to go to [facility] ED.  Called and spoke to pt.  He states he has no money, no way to get to his chemo appts.  Advised that based on what is acutely going on at the moment with seeing things that are not there and falling at home, he needs to be evaluated 1st in the ED.  Will let Lauren know about financial and transportation issues as well.  Pt verbalized understanding and is going to ask them to bring him to [facility] ED.

## 2014-09-01 NOTE — ED Notes (Signed)
Patient returned from MRI.

## 2014-09-01 NOTE — ED Notes (Signed)
Bed: WA08 Expected date:  Expected time:  Means of arrival:  Comments: EMS- back pain, Hx of CA

## 2014-09-02 ENCOUNTER — Ambulatory Visit (HOSPITAL_BASED_OUTPATIENT_CLINIC_OR_DEPARTMENT_OTHER): Payer: Medicaid Other | Admitting: Nurse Practitioner

## 2014-09-02 ENCOUNTER — Telehealth: Payer: Self-pay | Admitting: Medical Oncology

## 2014-09-02 ENCOUNTER — Ambulatory Visit: Payer: Medicaid Other

## 2014-09-02 ENCOUNTER — Encounter: Payer: Self-pay | Admitting: *Deleted

## 2014-09-02 ENCOUNTER — Other Ambulatory Visit: Payer: Self-pay | Admitting: *Deleted

## 2014-09-02 VITALS — BP 102/49 | HR 84 | Temp 98.1°F | Resp 18 | Ht 70.0 in | Wt 130.7 lb

## 2014-09-02 DIAGNOSIS — G893 Neoplasm related pain (acute) (chronic): Secondary | ICD-10-CM

## 2014-09-02 DIAGNOSIS — C3490 Malignant neoplasm of unspecified part of unspecified bronchus or lung: Secondary | ICD-10-CM

## 2014-09-02 DIAGNOSIS — R443 Hallucinations, unspecified: Secondary | ICD-10-CM

## 2014-09-02 DIAGNOSIS — E860 Dehydration: Secondary | ICD-10-CM

## 2014-09-02 DIAGNOSIS — C7952 Secondary malignant neoplasm of bone marrow: Secondary | ICD-10-CM

## 2014-09-02 DIAGNOSIS — W19XXXD Unspecified fall, subsequent encounter: Secondary | ICD-10-CM

## 2014-09-02 DIAGNOSIS — C7951 Secondary malignant neoplasm of bone: Secondary | ICD-10-CM

## 2014-09-02 DIAGNOSIS — C3410 Malignant neoplasm of upper lobe, unspecified bronchus or lung: Secondary | ICD-10-CM

## 2014-09-02 NOTE — Telephone Encounter (Signed)
Pt scheduled to see Selena Lesser today -f/u from ED visit.

## 2014-09-02 NOTE — Progress Notes (Signed)
[place] [facility]  Clinical Social Work was referred by Therapist, sports for assessment of psychosocial needs- patient left voicemail stating he was seeing hallucinations and had multiple falls.  Clinical Social Worker contacted patient by phone to offer support and assess for needs.  Jacob Webb was recently discharged from ED.  CSW arranged taxi voucher to and from cancer center visit today.  CSW met with patient and Jacob Webb, Triage NP.  Jacob Webb is currently living with daughter and three grandchildren.  They are still without power and will be evicted next week.  The patient is unsure where they will go.  CSW will follow up with patient's daughter to discuss living options.  NP ordered psych eval to rule out psych hallucinations, rolling walker and SW visit through [facility].  CSW encouraged patient to call CSW as as needs arise.    Polo Riley, MSW, LCSW, OSW-C Clinical Social Worker [facility] [phone]

## 2014-09-03 ENCOUNTER — Ambulatory Visit: Payer: Medicaid Other

## 2014-09-03 MED ORDER — SODIUM CHLORIDE 0.9 % IV SOLN: INTRAVENOUS | Status: DC

## 2014-09-03 NOTE — Progress Notes (Signed)
[facility]  [facility] is providing the following services: RW w/seat  If patient discharges after hours, please call [phone].   Linward Headland 09/03/2014, 9:25 AM

## 2014-09-04 ENCOUNTER — Ambulatory Visit: Payer: Medicaid Other

## 2014-09-06 ENCOUNTER — Other Ambulatory Visit: Payer: Self-pay | Admitting: *Deleted

## 2014-09-06 NOTE — Progress Notes (Signed)
Social work consult made through [facility] for patient per recommendation of [employer].

## 2014-09-07 ENCOUNTER — Other Ambulatory Visit: Payer: Self-pay | Admitting: *Deleted

## 2014-09-07 ENCOUNTER — Encounter: Payer: Self-pay | Admitting: Nurse Practitioner

## 2014-09-07 DIAGNOSIS — R443 Hallucinations, unspecified: Secondary | ICD-10-CM | POA: Insufficient documentation

## 2014-09-07 DIAGNOSIS — W19XXXA Unspecified fall, initial encounter: Secondary | ICD-10-CM | POA: Insufficient documentation

## 2014-09-07 DIAGNOSIS — G893 Neoplasm related pain (acute) (chronic): Secondary | ICD-10-CM | POA: Insufficient documentation

## 2014-09-07 DIAGNOSIS — C3490 Malignant neoplasm of unspecified part of unspecified bronchus or lung: Secondary | ICD-10-CM

## 2014-09-07 NOTE — Assessment & Plan Note (Signed)
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  Patient takes oxycodone on an as-needed basis for his pain.  He states he does not need a refill of his pain medication at this time.

## 2014-09-07 NOTE — Assessment & Plan Note (Signed)
Patient does have a history of frequent falls; and states that he did fall once again a few days ago.  He was fully evaluated in the emergency department just yesterday; including a MRI of his brain.  Patient was encouraged to use either his cane or his walker for ambulation.

## 2014-09-07 NOTE — Assessment & Plan Note (Signed)
The patient does appear to have occasional hallucinations.  Brain MRI obtained while in the emergency department just yesterday was negative for any acute findings.  Patient does appear alert and oriented x3 at present.  Patient will have home health come out within the next few days for an initial evaluation.  We'll also order a Education officer, museum home visit as well.  We'll order a psych evaluation as well.  Also gave patient mobile mental health crisis unit number to call for her any issues he may need assistance with.  It does appear that patient's home life is fairly difficult at present.  He states he has no power in his home.  He states that both his family will be required to move out of their present home within this next week; and he is unclear where he will move to.  He is considering moving his family to a family shelter.  Social worker that was present during this exam today were also help with any assistance she can.  Patient knows to call the [place] or go to the emergency department with any new worries or concerns whatsoever.

## 2014-09-07 NOTE — Assessment & Plan Note (Signed)
Patient continues with carboplatin/etoposide chemotherapy regimen.  Patient will be scheduled for his next chemotherapy on 09/22/2014.

## 2014-09-07 NOTE — Progress Notes (Signed)
[place]   Chief Complaint  Patient presents with  . Follow-up    HPI: Jacob Webb 63 y.o. male diagnosed with lung cancer.  Currently undergoing carboplatin/etoposide chemotherapy regimen.  Patient in today for followup from emergency department visit just yesterday.  Patient states that he has had some episodes of hallucinations; and also experienced a fall within the last few days.  Patient did obtain a brain MRI on the emergency department just yesterday; and it revealed no acute findings.  Patient states that he has not had any hallucinations within the past 2 days.  Patient denies any suicidal or homicidal ideations.  Patient has a history of bone metastasis; and suffers with chronic pain.  He states he doesn't need a refill of his pain medication at present.  Patient reports that he has a very difficult home life at present.  He lives in a home with both his daughter and grandchildren.  He states he has no power in his home.  He states that he is required to move his family out of this home within this next week; and has no clear idea where he would move his family to.  He is considering moving his family to family shelter.  He reports he has minimal of food to eat at his home as well.  Patient also denies any other new symptoms whatsoever.  He denies any nausea, vomiting, or diarrhea.  He denies any recent fevers or chills.   HPI  CURRENT THERAPY: Upcoming Treatment Dates - LUNG SMALL CELL Carboplatin D1 / Etoposide D1-3 q21d Days with orders from any treatment category:  09/01/2014      SCHEDULING COMMUNICATION      ondansetron (ZOFRAN) IVPB 16 mg      Dexamethasone Sodium Phosphate (DECADRON) injection 20 mg      CARBOplatin CHEMO intradermal Test Dose 100 mcg/0.85ml      CARBOplatin (PARAPLATIN) 460 mg in sodium chloride 0.9 % 250 mL chemo infusion      etoposide (VEPESID) 180 mg in sodium chloride 0.9 % 500 mL chemo infusion      sodium chloride 0.9 %  injection 10 mL      heparin lock flush 100 unit/mL      heparin lock flush 100 unit/mL      alteplase (CATHFLO ACTIVASE) injection 2 mg      sodium chloride 0.9 % injection 3 mL      Hot Pack 1 packet      0.9 %  sodium chloride infusion      TREATMENT CONDITIONS 09/02/2014      SCHEDULING COMMUNICATION      prochlorperazine (COMPAZINE) tablet 10 mg      etoposide (VEPESID) 180 mg in sodium chloride 0.9 % 500 mL chemo infusion      sodium chloride 0.9 % injection 10 mL      heparin lock flush 100 unit/mL      heparin lock flush 100 unit/mL      alteplase (CATHFLO ACTIVASE) injection 2 mg      sodium chloride 0.9 % injection 3 mL      Hot Pack 1 packet      0.9 %  sodium chloride infusion 09/03/2014      SCHEDULING COMMUNICATION      prochlorperazine (COMPAZINE) tablet 10 mg      etoposide (VEPESID) 180 mg in sodium chloride 0.9 % 500 mL chemo infusion      sodium chloride 0.9 % injection 10 mL  heparin lock flush 100 unit/mL      heparin lock flush 100 unit/mL      alteplase (CATHFLO ACTIVASE) injection 2 mg      sodium chloride 0.9 % injection 3 mL      Hot Pack 1 packet      0.9 %  sodium chloride infusion    ROS  Past Medical History  Diagnosis Date  . Cancer     lung ca dx'd 04/2013  . Cancer 11/30/13 ct    rt para renal mass metastatic  . History of radiation therapy 01/07/14-01/26/14    abdomen/30 Gy/36fx  . Hx of radiation therapy 04/05/14-04/09/14    palliative left femur, 20Gy    History reviewed. No pertinent past surgical history.  has Malignant neoplasm of bronchus and lung, unspecified site; Delirium; Agitation; Hypokalemia; Pancytopenia; Secondary malignant neoplasm of bone and bone marrow; Fatigue; Abdominal pain, unspecified site; Constipation due to opioid therapy; Neoplasm related pain (acute) (chronic); Fall; and Hallucination on his problem list.     has No Known Allergies.    Medication List       This list is accurate as of: 09/02/14 11:59  PM.  Always use your most recent med list.               amoxicillin-clavulanate 875-125 MG per tablet  Commonly known as:  AUGMENTIN  Take 1 tablet by mouth every 12 (twelve) hours.     ondansetron 8 MG disintegrating tablet  Commonly known as:  ZOFRAN-ODT  Take 8 mg by mouth every 8 (eight) hours as needed for nausea or vomiting.     oxyCODONE 5 MG immediate release tablet  Commonly known as:  Oxy IR/ROXICODONE  Take 5-10 mg by mouth every 4 (four) hours as needed for severe pain.     promethazine 25 MG tablet  Commonly known as:  PHENERGAN  Take 25 mg by mouth every 6 (six) hours as needed for nausea or vomiting.         PHYSICAL EXAMINATION  Blood pressure 102/49, pulse 84, temperature 98.1 F (36.7 C), temperature source Oral, resp. rate 18, height 5\' 10"  (1.778 m), weight 130 lb 11.2 oz (59.285 kg), SpO2 98.00%.  Physical Exam  Nursing note and vitals reviewed. Constitutional: He is oriented to person, place, and time and well-developed, well-nourished, and in no distress. No distress.  HENT:  Head: Normocephalic and atraumatic.  Mouth/Throat: Oropharynx is clear and moist. No oropharyngeal exudate.  Eyes: Conjunctivae are normal. Pupils are equal, round, and reactive to light. Right eye exhibits no discharge. Left eye exhibits no discharge. No scleral icterus.  Neck: Normal range of motion. Neck supple. No tracheal deviation present. No thyromegaly present.  Cardiovascular: Normal rate, regular rhythm, normal heart sounds and intact distal pulses.   Pulmonary/Chest: Effort normal and breath sounds normal. No respiratory distress.  Abdominal: Soft. Bowel sounds are normal. He exhibits no distension. There is no tenderness. There is no rebound.  Musculoskeletal: Normal range of motion. He exhibits no edema and no tenderness.  Lymphadenopathy:    He has no cervical adenopathy.  Neurological: He is alert and oriented to person, place, and time. He has normal reflexes.  GCS score is 15.  Skin: Skin is warm and dry. No rash noted. No erythema.  Psychiatric: Mood, memory, affect and judgment normal.   ASSESSMENT/PLAN:    Malignant neoplasm of bronchus and lung, unspecified site  Assessment & Plan Patient continues with carboplatin/etoposide chemotherapy regimen.  Patient will be  scheduled for his next chemotherapy on 09/22/2014.   Neoplasm related pain (acute) (chronic)  Assessment & Plan Patient does suffer with chronic pain from bone metastasis.  Patient takes oxycodone on an as-needed basis for his pain.  He states he does not need a refill of his pain medication at this time.   Fall  Assessment & Plan Patient does have a history of frequent falls; and states that he did fall once again a few days ago.  He was fully evaluated in the emergency department just yesterday; including a MRI of his brain.  Patient was encouraged to use either his cane or his walker for ambulation.   Hallucination  Assessment & Plan The patient does appear to have occasional hallucinations.  Brain MRI obtained while in the emergency department just yesterday was negative for any acute findings.  Patient does appear alert and oriented x3 at present.  Patient will have home health come out within the next few days for an initial evaluation.  We'll also order a Education officer, museum home visit as well.  We'll order a psych evaluation as well.  Also gave patient mobile mental health crisis unit number to call for her any issues he may need assistance with.  It does appear that patient's home life is fairly difficult at present.  He states he has no power in his home.  He states that both his family will be required to move out of their present home within this next week; and he is unclear where he will move to.  He is considering moving his family to a family shelter.  Social worker that was present during this exam today were also help with any assistance she can.  Patient knows to  call the [facility] or go to the emergency department with any new worries or concerns whatsoever.     Patient stated understanding of all instructions; and was in agreement with this plan of care. The patient knows to call the clinic with any problems, questions or concerns.   Review/collaboration with Dr. Julien Nordmann regarding all aspects of patient's visit today.   Total time spent with patient was 40 minutes;  with greater than 75 percent of that time spent in face to face counseling regarding his symptoms, and coordination of care and follow up.  Disclaimer: This note was dictated with voice recognition software. Similar sounding words can inadvertently be transcribed and may not be corrected upon review.   Drue Second, NP 09/07/2014

## 2014-09-08 ENCOUNTER — Telehealth: Payer: Self-pay | Admitting: *Deleted

## 2014-09-08 NOTE — Telephone Encounter (Signed)
Received letter from [facility] urology stating that pt [place] for f/u appt with Dr Karsten Ro 09/02/14.  Given to Dr Vista Mink to review.

## 2014-09-09 ENCOUNTER — Telehealth: Payer: Self-pay | Admitting: Internal Medicine

## 2014-09-09 ENCOUNTER — Other Ambulatory Visit: Payer: Self-pay | Admitting: Medical Oncology

## 2014-09-09 DIAGNOSIS — C3490 Malignant neoplasm of unspecified part of unspecified bronchus or lung: Secondary | ICD-10-CM

## 2014-09-09 NOTE — Telephone Encounter (Signed)
, °

## 2014-09-22 ENCOUNTER — Other Ambulatory Visit (HOSPITAL_BASED_OUTPATIENT_CLINIC_OR_DEPARTMENT_OTHER): Payer: Medicaid Other

## 2014-09-22 ENCOUNTER — Ambulatory Visit (HOSPITAL_BASED_OUTPATIENT_CLINIC_OR_DEPARTMENT_OTHER): Payer: Medicaid Other

## 2014-09-22 ENCOUNTER — Telehealth: Payer: Self-pay

## 2014-09-22 ENCOUNTER — Ambulatory Visit (HOSPITAL_BASED_OUTPATIENT_CLINIC_OR_DEPARTMENT_OTHER): Payer: Medicaid Other | Admitting: Physician Assistant

## 2014-09-22 ENCOUNTER — Telehealth: Payer: Self-pay | Admitting: Internal Medicine

## 2014-09-22 VITALS — BP 100/64 | HR 84 | Temp 98.2°F | Resp 18 | Ht 70.0 in

## 2014-09-22 DIAGNOSIS — C7931 Secondary malignant neoplasm of brain: Secondary | ICD-10-CM

## 2014-09-22 DIAGNOSIS — Z5111 Encounter for antineoplastic chemotherapy: Secondary | ICD-10-CM

## 2014-09-22 DIAGNOSIS — C3410 Malignant neoplasm of upper lobe, unspecified bronchus or lung: Secondary | ICD-10-CM

## 2014-09-22 DIAGNOSIS — C3490 Malignant neoplasm of unspecified part of unspecified bronchus or lung: Secondary | ICD-10-CM

## 2014-09-22 DIAGNOSIS — C3491 Malignant neoplasm of unspecified part of right bronchus or lung: Secondary | ICD-10-CM

## 2014-09-22 LAB — CBC WITH DIFFERENTIAL/PLATELET
BASO%: 0.4 % (ref 0.0–2.0)
BASOS ABS: 0 10*3/uL (ref 0.0–0.1)
EOS%: 2.2 % (ref 0.0–7.0)
Eosinophils Absolute: 0.2 10*3/uL (ref 0.0–0.5)
HCT: 30.4 % — ABNORMAL LOW (ref 38.4–49.9)
HEMOGLOBIN: 9.6 g/dL — AB (ref 13.0–17.1)
LYMPH%: 7.1 % — ABNORMAL LOW (ref 14.0–49.0)
MCH: 30.6 pg (ref 27.2–33.4)
MCHC: 31.6 g/dL — AB (ref 32.0–36.0)
MCV: 96.8 fL (ref 79.3–98.0)
MONO#: 0.8 10*3/uL (ref 0.1–0.9)
MONO%: 8.5 % (ref 0.0–14.0)
NEUT#: 7.5 10*3/uL — ABNORMAL HIGH (ref 1.5–6.5)
NEUT%: 81.8 % — ABNORMAL HIGH (ref 39.0–75.0)
Platelets: 209 10*3/uL (ref 140–400)
RBC: 3.14 10*6/uL — AB (ref 4.20–5.82)
RDW: 16.3 % — ABNORMAL HIGH (ref 11.0–14.6)
WBC: 9.2 10*3/uL (ref 4.0–10.3)
lymph#: 0.7 10*3/uL — ABNORMAL LOW (ref 0.9–3.3)

## 2014-09-22 LAB — COMPREHENSIVE METABOLIC PANEL (CC13)
ALBUMIN: 3 g/dL — AB (ref 3.5–5.0)
ALK PHOS: 142 U/L (ref 40–150)
ALT: 15 U/L (ref 0–55)
AST: 22 U/L (ref 5–34)
Anion Gap: 7 mEq/L (ref 3–11)
BUN: 21.5 mg/dL (ref 7.0–26.0)
CALCIUM: 9.8 mg/dL (ref 8.4–10.4)
CHLORIDE: 107 meq/L (ref 98–109)
CO2: 26 mEq/L (ref 22–29)
Creatinine: 1 mg/dL (ref 0.7–1.3)
Glucose: 112 mg/dl (ref 70–140)
Potassium: 3.9 mEq/L (ref 3.5–5.1)
SODIUM: 141 meq/L (ref 136–145)
TOTAL PROTEIN: 7.2 g/dL (ref 6.4–8.3)
Total Bilirubin: 0.22 mg/dL (ref 0.20–1.20)

## 2014-09-22 MED ORDER — OXYCODONE HCL 5 MG PO TABS: 5.0000 mg | ORAL_TABLET | ORAL | Status: DC | PRN

## 2014-09-22 MED ORDER — ONDANSETRON 16 MG/50ML IVPB (CHCC)
16.0000 mg | Freq: Once | INTRAVENOUS | Status: AC
  Administered 2014-09-22: 16 mg via INTRAVENOUS

## 2014-09-22 MED ORDER — CARBOPLATIN CHEMO INTRADERMAL TEST DOSE 100MCG/0.02ML
100.0000 ug | Freq: Once | INTRADERMAL | Status: AC
  Administered 2014-09-22: 100 ug via INTRADERMAL
  Filled 2014-09-22: qty 0.01

## 2014-09-22 MED ORDER — SODIUM CHLORIDE 0.9 % IV SOLN
Freq: Once | INTRAVENOUS | Status: AC
  Administered 2014-09-22: 16:00:00 via INTRAVENOUS

## 2014-09-22 MED ORDER — ONDANSETRON 16 MG/50ML IVPB (CHCC)
INTRAVENOUS | Status: AC
Start: 2014-09-22 — End: 2014-09-22
  Filled 2014-09-22: qty 16

## 2014-09-22 MED ORDER — OXYCODONE-ACETAMINOPHEN 5-325 MG PO TABS
1.0000 | ORAL_TABLET | Freq: Once | ORAL | Status: AC
  Administered 2014-09-22: 1 via ORAL

## 2014-09-22 MED ORDER — SODIUM CHLORIDE 0.9 % IV SOLN
100.0000 mg/m2 | Freq: Once | INTRAVENOUS | Status: AC
  Administered 2014-09-22: 180 mg via INTRAVENOUS
  Filled 2014-09-22: qty 9

## 2014-09-22 MED ORDER — DEXAMETHASONE SODIUM PHOSPHATE 20 MG/5ML IJ SOLN
INTRAMUSCULAR | Status: AC
  Filled 2014-09-22: qty 5

## 2014-09-22 MED ORDER — SODIUM CHLORIDE 0.9 % IV SOLN
460.0000 mg | Freq: Once | INTRAVENOUS | Status: AC
  Administered 2014-09-22: 460 mg via INTRAVENOUS
  Filled 2014-09-22: qty 46

## 2014-09-22 MED ORDER — DEXAMETHASONE SODIUM PHOSPHATE 20 MG/5ML IJ SOLN
20.0000 mg | Freq: Once | INTRAMUSCULAR | Status: AC
  Administered 2014-09-22: 20 mg via INTRAVENOUS

## 2014-09-22 NOTE — Patient Instructions (Signed)
[facility] Discharge Instructions for Patients Receiving Chemotherapy  Today you received the following chemotherapy agents Etoposide, Carboplatin  To help prevent nausea and vomiting after your treatment, we encourage you to take your nausea medication     If you develop nausea and vomiting that is not controlled by your nausea medication, call the clinic.   BELOW ARE SYMPTOMS THAT SHOULD BE REPORTED IMMEDIATELY:  *FEVER GREATER THAN 100.5 F  *CHILLS WITH OR WITHOUT FEVER  NAUSEA AND VOMITING THAT IS NOT CONTROLLED WITH YOUR NAUSEA MEDICATION  *UNUSUAL SHORTNESS OF BREATH  *UNUSUAL BRUISING OR BLEEDING  TENDERNESS IN MOUTH AND THROAT WITH OR WITHOUT PRESENCE OF ULCERS  *URINARY PROBLEMS  *BOWEL PROBLEMS  UNUSUAL RASH Items with * indicate a potential emergency and should be followed up as soon as possible.  Feel free to call the clinic you have any questions or concerns. The clinic phone number is [phone].

## 2014-09-22 NOTE — Progress Notes (Signed)
Patient to be in infusion room until 1830 this evening and has new Rx for Oxycodone 5 mg; nurse Lanelle Bal, [place]) picked up Rx at [facility] to pick up med before it closes at 1800.

## 2014-09-22 NOTE — Telephone Encounter (Signed)
gv pt appt schedule for oct/nov to [facility] will give to pt in inf along w/ct prep. central will call re ct appt. per AJ next lb/fu/tx scheduled for 11/4 instead of 10/28 as requested per 10/7 pof.

## 2014-09-22 NOTE — Telephone Encounter (Signed)
Called and LM with Polo Riley, CSW per Rush Farmer, PA to see if she can follow up with pt, Informed her pt was in infusion for a few hrs today.

## 2014-09-23 ENCOUNTER — Ambulatory Visit (HOSPITAL_BASED_OUTPATIENT_CLINIC_OR_DEPARTMENT_OTHER): Payer: Medicaid Other

## 2014-09-23 VITALS — BP 122/70 | HR 86 | Temp 98.1°F | Resp 17 | Ht 70.0 in

## 2014-09-23 DIAGNOSIS — C3491 Malignant neoplasm of unspecified part of right bronchus or lung: Secondary | ICD-10-CM

## 2014-09-23 DIAGNOSIS — Z5111 Encounter for antineoplastic chemotherapy: Secondary | ICD-10-CM

## 2014-09-23 DIAGNOSIS — C3412 Malignant neoplasm of upper lobe, left bronchus or lung: Secondary | ICD-10-CM

## 2014-09-23 MED ORDER — SODIUM CHLORIDE 0.9 % IV SOLN
100.0000 mg/m2 | Freq: Once | INTRAVENOUS | Status: AC
  Administered 2014-09-23: 180 mg via INTRAVENOUS
  Filled 2014-09-23: qty 9

## 2014-09-23 MED ORDER — SODIUM CHLORIDE 0.9 % IV SOLN
Freq: Once | INTRAVENOUS | Status: AC
  Administered 2014-09-23: 15:00:00 via INTRAVENOUS

## 2014-09-23 MED ORDER — PROCHLORPERAZINE MALEATE 10 MG PO TABS
ORAL_TABLET | ORAL | Status: AC
  Filled 2014-09-23: qty 1

## 2014-09-23 MED ORDER — PROCHLORPERAZINE MALEATE 10 MG PO TABS
10.0000 mg | ORAL_TABLET | Freq: Once | ORAL | Status: AC
  Administered 2014-09-23: 10 mg via ORAL

## 2014-09-23 NOTE — Progress Notes (Signed)
Patient discharged - ambulatory with cane - escorted by RN  to entrance where security guard gave him a ride to his car.

## 2014-09-23 NOTE — Patient Instructions (Signed)
[facility] Discharge Instructions for Patients Receiving Chemotherapy  Today you received the following chemotherapy agents Etoposide  To help prevent nausea and vomiting after your treatment, we encourage you to take your nausea medication Compazine   If you develop nausea and vomiting that is not controlled by your nausea medication, call the clinic.   BELOW ARE SYMPTOMS THAT SHOULD BE REPORTED IMMEDIATELY:  *FEVER GREATER THAN 100.5 F  *CHILLS WITH OR WITHOUT FEVER  NAUSEA AND VOMITING THAT IS NOT CONTROLLED WITH YOUR NAUSEA MEDICATION  *UNUSUAL SHORTNESS OF BREATH  *UNUSUAL BRUISING OR BLEEDING  TENDERNESS IN MOUTH AND THROAT WITH OR WITHOUT PRESENCE OF ULCERS  *URINARY PROBLEMS  *BOWEL PROBLEMS  UNUSUAL RASH Items with * indicate a potential emergency and should be followed up as soon as possible.  Feel free to call the clinic you have any questions or concerns. The clinic phone number is [phone].

## 2014-09-24 ENCOUNTER — Ambulatory Visit (HOSPITAL_BASED_OUTPATIENT_CLINIC_OR_DEPARTMENT_OTHER): Payer: Medicaid Other

## 2014-09-24 VITALS — BP 97/55 | HR 92 | Temp 97.8°F

## 2014-09-24 DIAGNOSIS — Z5111 Encounter for antineoplastic chemotherapy: Secondary | ICD-10-CM

## 2014-09-24 DIAGNOSIS — C3491 Malignant neoplasm of unspecified part of right bronchus or lung: Secondary | ICD-10-CM

## 2014-09-24 DIAGNOSIS — C3412 Malignant neoplasm of upper lobe, left bronchus or lung: Secondary | ICD-10-CM

## 2014-09-24 MED ORDER — SODIUM CHLORIDE 0.9 % IV SOLN
100.0000 mg/m2 | Freq: Once | INTRAVENOUS | Status: AC
  Administered 2014-09-24: 180 mg via INTRAVENOUS
  Filled 2014-09-24: qty 9

## 2014-09-24 MED ORDER — PROCHLORPERAZINE MALEATE 10 MG PO TABS
ORAL_TABLET | ORAL | Status: AC
  Filled 2014-09-24: qty 1

## 2014-09-24 MED ORDER — SODIUM CHLORIDE 0.9 % IV SOLN
Freq: Once | INTRAVENOUS | Status: AC
  Administered 2014-09-24: 15:00:00 via INTRAVENOUS

## 2014-09-24 MED ORDER — PROCHLORPERAZINE MALEATE 10 MG PO TABS
10.0000 mg | ORAL_TABLET | Freq: Once | ORAL | Status: AC
  Administered 2014-09-24: 10 mg via ORAL

## 2014-09-24 NOTE — Patient Instructions (Signed)
[facility] Discharge Instructions for Patients Receiving Chemotherapy  Today you received the following chemotherapy agents etoposide.  To help prevent nausea and vomiting after your treatment, we encourage you to take your nausea medication as directed.    If you develop nausea and vomiting that is not controlled by your nausea medication, call the clinic.   BELOW ARE SYMPTOMS THAT SHOULD BE REPORTED IMMEDIATELY:  *FEVER GREATER THAN 100.5 F  *CHILLS WITH OR WITHOUT FEVER  NAUSEA AND VOMITING THAT IS NOT CONTROLLED WITH YOUR NAUSEA MEDICATION  *UNUSUAL SHORTNESS OF BREATH  *UNUSUAL BRUISING OR BLEEDING  TENDERNESS IN MOUTH AND THROAT WITH OR WITHOUT PRESENCE OF ULCERS  *URINARY PROBLEMS  *BOWEL PROBLEMS  UNUSUAL RASH Items with * indicate a potential emergency and should be followed up as soon as possible.  Feel free to call the clinic you have any questions or concerns. The clinic phone number is [phone].

## 2014-09-25 ENCOUNTER — Ambulatory Visit (HOSPITAL_BASED_OUTPATIENT_CLINIC_OR_DEPARTMENT_OTHER): Payer: Medicaid Other

## 2014-09-25 VITALS — BP 99/53 | HR 92 | Temp 98.8°F | Resp 18

## 2014-09-25 DIAGNOSIS — Z5189 Encounter for other specified aftercare: Secondary | ICD-10-CM

## 2014-09-25 DIAGNOSIS — C3491 Malignant neoplasm of unspecified part of right bronchus or lung: Secondary | ICD-10-CM

## 2014-09-25 DIAGNOSIS — C3412 Malignant neoplasm of upper lobe, left bronchus or lung: Secondary | ICD-10-CM

## 2014-09-25 MED ORDER — PEGFILGRASTIM INJECTION 6 MG/0.6ML
6.0000 mg | Freq: Once | SUBCUTANEOUS | Status: AC
  Administered 2014-09-25: 6 mg via SUBCUTANEOUS

## 2014-09-25 NOTE — Progress Notes (Addendum)
[place] [phone]   [phone]  SHARED VISIT PROGRESS NOTE  No PCP Per Patient No address on file  DIAGNOSIS AND STAGE: Extensive stage small cell lung cancer diagnosed in May of 2014   PRIOR THERAPY:  1) whole Brain irradiation as well as palliative radiotherapy to the chest mass.  2) Systemic chemotherapy with carboplatin for AUC of 5 on day 1 and etoposide 120 mg/M2 on days 1, 2 and 3 with Neulasta support on day 4, status post 5 cycles. First cycle was started on 05/25/2013.  3) palliative radiotherapy to the abdomen metastasis adjacent to the right kidney under the care of Dr. Lisbeth Renshaw completed on 01/26/2014. 4) palliative radiotherapy to the left hip under the care of Dr. Lisbeth Renshaw.  CURRENT THERAPY: Second course of systemic chemotherapy with carboplatin for AUC of 5 on day 1 and etoposide 100 mg/M2 on days 1, 2 and 3 with Neulasta support on day 4. Status post 4 cycles.  CHEMOTHERAPY INTENT: Palliative  CURRENT # OF CHEMOTHERAPY CYCLES: 5 CURRENT ANTIEMETICS: Zofran, dexamethasone and Compazine  CURRENT SMOKING STATUS: Current smoker and strongly advised to quit smoking and offered smoke cessation program  ORAL CHEMOTHERAPY AND CONSENT: None  CURRENT BISPHOSPHONATES USE: None  PAIN MANAGEMENT: Well-managed with Percocet when necessary  NARCOTICS INDUCED CONSTIPATION: No constipation but has a stool softener at home  LIVING WILL AND CODE STATUS: Discussed with the patient and he is still Full code.   INTERVAL HISTORY: Jacob Webb 63 y.o. male returns to the clinic today for followup visit. He presents to proceed with cycle #5. He continues to have a difficult social situation. He complains of back pain. He requests a refill for his pain medication. The patient is feeling fine today with no specific complaints except for fatigue and intermittent abdominal pain. He denied having any significant chest pain but continues to have shortness  of breath with exertion with no cough or hemoptysis. He denied having any fever or chills. His by mouth intake is been a bit decreased secondary to his social situation. The patient denied having any night sweats.   MEDICAL HISTORY: Past Medical History  Diagnosis Date  . Cancer     lung ca dx'd 04/2013  . Cancer 11/30/13 ct    rt para renal mass metastatic  . History of radiation therapy 01/07/14-01/26/14    abdomen/30 Gy/30fx  . Hx of radiation therapy 04/05/14-04/09/14    palliative left femur, 20Gy    ALLERGIES:  has No Known Allergies.  MEDICATIONS:  Current Outpatient Prescriptions  Medication Sig Dispense Refill  . amoxicillin-clavulanate (AUGMENTIN) 875-125 MG per tablet Take 1 tablet by mouth every 12 (twelve) hours.  14 tablet  0  . ondansetron (ZOFRAN-ODT) 8 MG disintegrating tablet Take 8 mg by mouth every 8 (eight) hours as needed for nausea or vomiting.      Marland Kitchen oxyCODONE (OXY IR/ROXICODONE) 5 MG immediate release tablet Take 1-2 tablets (5-10 mg total) by mouth every 4 (four) hours as needed for severe pain.  60 tablet  0  . promethazine (PHENERGAN) 25 MG tablet Take 25 mg by mouth every 6 (six) hours as needed for nausea or vomiting.       No current facility-administered medications for this visit.   REVIEW OF SYSTEMS:  Constitutional: positive for fatigue Eyes: negative Ears, nose, mouth, throat, and face: negative Respiratory: positive for dyspnea on exertion Cardiovascular: negative Gastrointestinal: negative Genitourinary:negative Integument/breast: negative Hematologic/lymphatic: negative Musculoskeletal:positive for back pain  and bone pain Neurological: negative Behavioral/Psych: negative Endocrine: negative Allergic/Immunologic: negative   PHYSICAL EXAMINATION: General appearance: alert, cooperative, fatigued and no distress Head: Normocephalic, without obvious abnormality, atraumatic Neck: no adenopathy Lymph nodes: Cervical, supraclavicular, and  axillary nodes normal. Resp: clear to auscultation bilaterally Back: symmetric, no curvature. ROM normal. No CVA tenderness. Cardio: regular rate and rhythm, S1, S2 normal, no murmur, click, rub or gallop GI: soft, non-tender; bowel sounds normal; no masses,  no organomegaly Extremities: extremities normal, atraumatic, no cyanosis or edema Neurologic: Alert and oriented X 3, normal strength and tone. Normal symmetric reflexes. Normal coordination and gait  ECOG PERFORMANCE STATUS: 1 - Symptomatic but completely ambulatory  There were no vitals taken for this visit.  LABORATORY DATA: Lab Results  Component Value Date   WBC 9.2 09/22/2014   HGB 9.6* 09/22/2014   HCT 30.4* 09/22/2014   MCV 96.8 09/22/2014   PLT 209 09/22/2014      Chemistry      Component Value Date/Time   NA 141 09/22/2014 1346   NA 139 09/01/2014 1346   K 3.9 09/22/2014 1346   K 4.1 09/01/2014 1346   CL 102 09/01/2014 1346   CL 105 06/08/2013 1009   CO2 26 09/22/2014 1346   CO2 27 09/01/2014 1346   BUN 21.5 09/22/2014 1346   BUN 14 09/01/2014 1346   CREATININE 1.0 09/22/2014 1346   CREATININE 1.03 09/01/2014 1346      Component Value Date/Time   CALCIUM 9.8 09/22/2014 1346   CALCIUM 10.1 09/01/2014 1346   ALKPHOS 142 09/22/2014 1346   ALKPHOS 148* 09/01/2014 1346   AST 22 09/22/2014 1346   AST 23 09/01/2014 1346   ALT 15 09/22/2014 1346   ALT 12 09/01/2014 1346   BILITOT 0.22 09/22/2014 1346   BILITOT 0.2* 09/01/2014 1346       RADIOGRAPHIC STUDIES:  Ct Abdomen Pelvis Wo Contrast  07/05/2014   CLINICAL DATA:  Restaging small cell lung cancer. Diagnosis marked 2014. Ongoing chemotherapy.  EXAM: CT CHEST, ABDOMEN AND PELVIS WITHOUT CONTRAST  TECHNIQUE: Multidetector CT imaging of the chest, abdomen and pelvis was performed following the standard protocol without IV contrast.  COMPARISON:  CT 07/05/2014.  FINDINGS: CT CHEST FINDINGS  There is no axillary or supraclavicular lymphadenopathy. No mediastinal adenopathy on this  noncontrast exam. There is fullness within the left suprahilar location (image 21, series 2) not changed from prior No pericardial fluid. Esophagus is normal.  Review of the lung parenchyma demonstrates severe centrilobular emphysema in the upper lobes. There is additionally linear fibrotic pattern in the left upper lobe suggesting radiation change. Stable pleural parenchymal nodular thickening at the lung apices. No new nodularity.  CT ABDOMEN AND PELVIS FINDINGS  On this noncontrast exam, there is again demonstrated ill-defined lesion inferior to the right hepatic lobe measuring approximately 10 mm not changed from prior. Within the right perirenal space there are 2 metastatic nodules again demonstrated. Lesion along along the lateral margin of right kidney measures 21 x 18 mm compared to 28 by 16 mm. This nodule visually appears increased in size. Nodule superior to the upper pole of the right kidney measures 28 x 25 mm increased from 24 x 27 mm on prior. There is hydronephrosis of the right kidney secondary to an obstructing lesion along the course of the right ureter measuring 18 x 23 mm (79, series 2). This nodule is unchanged from 27 x 17 mm on prior. Hydronephrosis appears slightly increased.  Small nodule measuring 8  mm in the left perirenal space not changed (image 66, series 2)  The gallbladder, pancreas, spleen, adrenal glands, and left kidney are normal.  The stomach, small bowel, and colon are unremarkable.  Abdominal aorta is normal caliber. No retroperitoneal periportal lymphadenopathy.  Prostate gland and bladder normal. No pelvic lymphadenopathy. No aggressive osseous lesion.  IMPRESSION: Chest Impression:  1. No evidence of local carcinoma recurrence or metastasis in the rocks. 2. Stable fullness in the left hilum.  Abdomen / Pelvis Impression:  1. Persistent nodular metastasis in the right pararenal space. These nodules may be slightly increased. 2. Nodule along the course of the right ureter  obstructs the right ureter with mild increase increase in moderate hydronephrosis of the right kidney. 3. Concern for a subtle right hepatic lobe metastasis.   Electronically Signed   By: Suzy Bouchard M.D.   On: 07/05/2014 14:44   Ct Chest Wo Contrast  07/05/2014   CLINICAL DATA:  Restaging small cell lung cancer. Diagnosis marked 2014. Ongoing chemotherapy.  EXAM: CT CHEST, ABDOMEN AND PELVIS WITHOUT CONTRAST  TECHNIQUE: Multidetector CT imaging of the chest, abdomen and pelvis was performed following the standard protocol without IV contrast.  COMPARISON:  CT 07/05/2014.  FINDINGS: CT CHEST FINDINGS  There is no axillary or supraclavicular lymphadenopathy. No mediastinal adenopathy on this noncontrast exam. There is fullness within the left suprahilar location (image 21, series 2) not changed from prior No pericardial fluid. Esophagus is normal.  Review of the lung parenchyma demonstrates severe centrilobular emphysema in the upper lobes. There is additionally linear fibrotic pattern in the left upper lobe suggesting radiation change. Stable pleural parenchymal nodular thickening at the lung apices. No new nodularity.  CT ABDOMEN AND PELVIS FINDINGS  On this noncontrast exam, there is again demonstrated ill-defined lesion inferior to the right hepatic lobe measuring approximately 10 mm not changed from prior. Within the right perirenal space there are 2 metastatic nodules again demonstrated. Lesion along along the lateral margin of right kidney measures 21 x 18 mm compared to 28 by 16 mm. This nodule visually appears increased in size. Nodule superior to the upper pole of the right kidney measures 28 x 25 mm increased from 24 x 27 mm on prior. There is hydronephrosis of the right kidney secondary to an obstructing lesion along the course of the right ureter measuring 18 x 23 mm (79, series 2). This nodule is unchanged from 27 x 17 mm on prior. Hydronephrosis appears slightly increased.  Small nodule  measuring 8 mm in the left perirenal space not changed (image 66, series 2)  The gallbladder, pancreas, spleen, adrenal glands, and left kidney are normal.  The stomach, small bowel, and colon are unremarkable.  Abdominal aorta is normal caliber. No retroperitoneal periportal lymphadenopathy.  Prostate gland and bladder normal. No pelvic lymphadenopathy. No aggressive osseous lesion.  IMPRESSION: Chest Impression:  1. No evidence of local carcinoma recurrence or metastasis in the rocks. 2. Stable fullness in the left hilum.  Abdomen / Pelvis Impression:  1. Persistent nodular metastasis in the right pararenal space. These nodules may be slightly increased. 2. Nodule along the course of the right ureter obstructs the right ureter with mild increase increase in moderate hydronephrosis of the right kidney. 3. Concern for a subtle right hepatic lobe metastasis.   Electronically Signed   By: Suzy Bouchard M.D.   On: 07/05/2014 14:44   ASSESSMENT AND PLAN:  This is a very pleasant 63 years old white male with extensive stage  small cell lung cancer: he completed 5 cycles of systemic chemotherapy with carboplatin and etoposide with significant improvement in his disease. His previous CT scan revealed evidence for disease progression and he was started on chemotherapy again with carboplatin and etoposide with Neulasta support. He is status post 4 cycles. The patient was discussed with her and also seen by Dr. Julien Nordmann. He will proceed with cycle #5 today as scheduled. We'll plan to obtain a restaging CT scan of the chest, abdomen and pelvis with contrast to reevaluate his disease prior to his return in 3 weeks. He will continue with weekly labs as scheduled. Patient was given refill prescription for his oxycodone tablets. He was advised to call immediately if he has any concerning symptoms in the interval.  The patient voices understanding of current disease status and treatment options and is in agreement with the  current care plan.  All questions were answered. The patient knows to call the clinic with any problems, questions or concerns. We can certainly see the patient much sooner if necessary.  Carlton Adam PA-C  ADDENDUM: Hematology/Oncology Attending: I had a face to face encounter with the patient. I recommended his care plan. This is a very pleasant 63 years old white male with extensive stage small cell lung cancer and recent disease progression. He is currently on systemic chemotherapy again with carboplatin and etoposide status post 4 cycles. We'll proceed with cycle #5 today as scheduled. He would come back for followup visit in 3 weeks with the next cycle of his treatment after repeating CT scan of the chest, abdomen and pelvis for restaging of his disease.  He was advised to call immediately if he has any concerning symptoms in the interval.  Disclaimer: This note was dictated with voice recognition software. Similar sounding words can inadvertently be transcribed and may be missed upon review. [name]., MD 09/26/2014

## 2014-09-25 NOTE — Patient Instructions (Signed)
Continue weekly labs as scheduled Followup in 3 weeks with restaging CT scan of the chest, abdomen and pelvis to reevaluate your disease

## 2014-09-25 NOTE — Patient Instructions (Signed)
Pegfilgrastim injection What is this medicine? PEGFILGRASTIM (peg fil GRA stim) is a long-acting granulocyte colony-stimulating factor that stimulates the growth of neutrophils, a type of white blood cell important in the body's fight against infection. It is used to reduce the incidence of fever and infection in patients with certain types of cancer who are receiving chemotherapy that affects the bone marrow. This medicine may be used for other purposes; ask your health care provider or pharmacist if you have questions. COMMON BRAND NAME(S): Neulasta What should I tell my health care provider before I take this medicine? They need to know if you have any of these conditions: -latex allergy -ongoing radiation therapy -sickle cell disease -skin reactions to acrylic adhesives (On-Body Injector only) -an unusual or allergic reaction to pegfilgrastim, filgrastim, other medicines, foods, dyes, or preservatives -pregnant or trying to get pregnant -breast-feeding How should I use this medicine? This medicine is for injection under the skin. If you get this medicine at home, you will be taught how to prepare and give the pre-filled syringe or how to use the On-body Injector. Refer to the patient Instructions for Use for detailed instructions. Use exactly as directed. Take your medicine at regular intervals. Do not take your medicine more often than directed. It is important that you put your used needles and syringes in a special sharps container. Do not put them in a trash can. If you do not have a sharps container, call your pharmacist or healthcare provider to get one. Talk to your pediatrician regarding the use of this medicine in children. Special care may be needed. Overdosage: If you think you have taken too much of this medicine contact a poison control center or emergency room at once. NOTE: This medicine is only for you. Do not share this medicine with others. What if I miss a dose? It is  important not to miss your dose. Call your doctor or health care professional if you miss your dose. If you miss a dose due to an On-body Injector failure or leakage, a new dose should be administered as soon as possible using a single prefilled syringe for manual use. What may interact with this medicine? Interactions have not been studied. Give your health care provider a list of all the medicines, herbs, non-prescription drugs, or dietary supplements you use. Also tell them if you smoke, drink alcohol, or use illegal drugs. Some items may interact with your medicine. This list may not describe all possible interactions. Give your health care provider a list of all the medicines, herbs, non-prescription drugs, or dietary supplements you use. Also tell them if you smoke, drink alcohol, or use illegal drugs. Some items may interact with your medicine. What should I watch for while using this medicine? You may need blood work done while you are taking this medicine. If you are going to need a MRI, CT scan, or other procedure, tell your doctor that you are using this medicine (On-Body Injector only). What side effects may I notice from receiving this medicine? Side effects that you should report to your doctor or health care professional as soon as possible: -allergic reactions like skin rash, itching or hives, swelling of the face, lips, or tongue -dizziness -fever -pain, redness, or irritation at site where injected -pinpoint red spots on the skin -shortness of breath or breathing problems -stomach or side pain, or pain at the shoulder -swelling -tiredness -trouble passing urine Side effects that usually do not require medical attention (report to your doctor   or health care professional if they continue or are bothersome): -bone pain -muscle pain This list may not describe all possible side effects. Call your doctor for medical advice about side effects. You may report side effects to FDA at  1-800-FDA-1088. Where should I keep my medicine? Keep out of the reach of children. Store pre-filled syringes in a refrigerator between 2 and 8 degrees C (36 and 46 degrees F). Do not freeze. Keep in carton to protect from light. Throw away this medicine if it is left out of the refrigerator for more than 48 hours. Throw away any unused medicine after the expiration date. NOTE: This sheet is a summary. It may not cover all possible information. If you have questions about this medicine, talk to your doctor, pharmacist, or health care provider.  2015, [employer] Standard. (2014-03-04 16:14:05)  

## 2014-09-29 ENCOUNTER — Other Ambulatory Visit (HOSPITAL_BASED_OUTPATIENT_CLINIC_OR_DEPARTMENT_OTHER): Payer: Medicaid Other

## 2014-09-29 DIAGNOSIS — C3490 Malignant neoplasm of unspecified part of unspecified bronchus or lung: Secondary | ICD-10-CM

## 2014-09-29 DIAGNOSIS — C3412 Malignant neoplasm of upper lobe, left bronchus or lung: Secondary | ICD-10-CM

## 2014-09-29 LAB — CBC WITH DIFFERENTIAL/PLATELET
BASO%: 0.4 % (ref 0.0–2.0)
BASOS ABS: 0 10*3/uL (ref 0.0–0.1)
EOS%: 4.4 % (ref 0.0–7.0)
Eosinophils Absolute: 0.1 10*3/uL (ref 0.0–0.5)
HEMATOCRIT: 28.8 % — AB (ref 38.4–49.9)
HGB: 9 g/dL — ABNORMAL LOW (ref 13.0–17.1)
LYMPH%: 26.8 % (ref 14.0–49.0)
MCH: 29.8 pg (ref 27.2–33.4)
MCHC: 31.2 g/dL — AB (ref 32.0–36.0)
MCV: 95.5 fL (ref 79.3–98.0)
MONO#: 0.1 10*3/uL (ref 0.1–0.9)
MONO%: 3.9 % (ref 0.0–14.0)
NEUT#: 1.1 10*3/uL — ABNORMAL LOW (ref 1.5–6.5)
NEUT%: 64.5 % (ref 39.0–75.0)
PLATELETS: 67 10*3/uL — AB (ref 140–400)
RBC: 3.02 10*6/uL — ABNORMAL LOW (ref 4.20–5.82)
RDW: 18.3 % — ABNORMAL HIGH (ref 11.0–14.6)
WBC: 1.8 10*3/uL — AB (ref 4.0–10.3)
lymph#: 0.5 10*3/uL — ABNORMAL LOW (ref 0.9–3.3)

## 2014-09-29 LAB — COMPREHENSIVE METABOLIC PANEL (CC13)
ALT: 13 U/L (ref 0–55)
ANION GAP: 8 meq/L (ref 3–11)
AST: 16 U/L (ref 5–34)
Albumin: 3.2 g/dL — ABNORMAL LOW (ref 3.5–5.0)
Alkaline Phosphatase: 162 U/L — ABNORMAL HIGH (ref 40–150)
BILIRUBIN TOTAL: 0.43 mg/dL (ref 0.20–1.20)
BUN: 28.6 mg/dL — AB (ref 7.0–26.0)
CALCIUM: 9.7 mg/dL (ref 8.4–10.4)
CHLORIDE: 106 meq/L (ref 98–109)
CO2: 24 meq/L (ref 22–29)
CREATININE: 1 mg/dL (ref 0.7–1.3)
GLUCOSE: 95 mg/dL (ref 70–140)
Potassium: 4.2 mEq/L (ref 3.5–5.1)
Sodium: 138 mEq/L (ref 136–145)
Total Protein: 7.3 g/dL (ref 6.4–8.3)

## 2014-10-01 ENCOUNTER — Encounter: Payer: Self-pay | Admitting: *Deleted

## 2014-10-01 NOTE — Progress Notes (Signed)
[place] Work  Clinical Social Work continues to provide support to patient through cancer journey.  CSW assisted patient in applying for [facility] cancer inititaive gas card program.  CSW also provided patient information on male and family shelters in [place] and [place].  Polo Riley, MSW, LCSW, OSW-C Clinical Social Worker [facility] [phone]

## 2014-10-04 ENCOUNTER — Telehealth: Payer: Self-pay | Admitting: *Deleted

## 2014-10-04 NOTE — Telephone Encounter (Signed)
Musician with [facility] called reporting pt's blood pressure = 84/58 in rt. Arm and 86/52 in left arm.  Asked him how much water he drinks and he says not drinking well.  "No running water in the house, no power and it is freezing in the home and no money for food.  Has a kerosene heater but no kerosene to put in it."  Three social workers trying to assist but no emergency funds perAmber.  Lauren SW with [employer], [place] social worker and a Education officer, museum with the [employer]  Denies dizziness, unsteady gait or any symptoms but know his systollic b/p is usually in the 110's.  99/53 is last reading at [facility] on 09-25-2014.  Asked Amber if his phones work.  When she tried to call no one answered and message that you're unable to leave a message was all she received.  Will notify providers and asked that she ensure he knows to go to the ER if symptomatic.  Called Polo Riley SW to provide her with this information about his home situation.

## 2014-10-06 ENCOUNTER — Ambulatory Visit (HOSPITAL_COMMUNITY): Payer: Medicaid Other

## 2014-10-06 ENCOUNTER — Other Ambulatory Visit: Payer: Medicaid Other

## 2014-10-11 ENCOUNTER — Other Ambulatory Visit (HOSPITAL_BASED_OUTPATIENT_CLINIC_OR_DEPARTMENT_OTHER): Payer: Medicaid Other

## 2014-10-11 ENCOUNTER — Ambulatory Visit (HOSPITAL_COMMUNITY): Payer: Medicaid Other

## 2014-10-11 ENCOUNTER — Encounter (HOSPITAL_COMMUNITY): Payer: Self-pay

## 2014-10-11 ENCOUNTER — Ambulatory Visit (HOSPITAL_COMMUNITY)
Admission: RE | Admit: 2014-10-11 | Discharge: 2014-10-11 | Disposition: A | Discharge status: Home / Self Care | Payer: Medicaid Other | Source: Ambulatory Visit | Attending: Physician Assistant | Admitting: Physician Assistant

## 2014-10-11 ENCOUNTER — Other Ambulatory Visit: Payer: Self-pay | Admitting: *Deleted

## 2014-10-11 DIAGNOSIS — C3490 Malignant neoplasm of unspecified part of unspecified bronchus or lung: Secondary | ICD-10-CM

## 2014-10-11 DIAGNOSIS — C7971 Secondary malignant neoplasm of right adrenal gland: Secondary | ICD-10-CM | POA: Insufficient documentation

## 2014-10-11 DIAGNOSIS — C3412 Malignant neoplasm of upper lobe, left bronchus or lung: Secondary | ICD-10-CM

## 2014-10-11 DIAGNOSIS — N2889 Other specified disorders of kidney and ureter: Secondary | ICD-10-CM | POA: Diagnosis not present

## 2014-10-11 DIAGNOSIS — N1330 Unspecified hydronephrosis: Secondary | ICD-10-CM | POA: Insufficient documentation

## 2014-10-11 DIAGNOSIS — I700 Atherosclerosis of aorta: Secondary | ICD-10-CM | POA: Insufficient documentation

## 2014-10-11 DIAGNOSIS — C7951 Secondary malignant neoplasm of bone: Secondary | ICD-10-CM | POA: Insufficient documentation

## 2014-10-11 DIAGNOSIS — K769 Liver disease, unspecified: Secondary | ICD-10-CM | POA: Insufficient documentation

## 2014-10-11 DIAGNOSIS — J432 Centrilobular emphysema: Secondary | ICD-10-CM | POA: Diagnosis not present

## 2014-10-11 LAB — COMPREHENSIVE METABOLIC PANEL (CC13)
ALT: 13 U/L (ref 0–55)
AST: 18 U/L (ref 5–34)
Albumin: 3.4 g/dL — ABNORMAL LOW (ref 3.5–5.0)
Alkaline Phosphatase: 132 U/L (ref 40–150)
Anion Gap: 7 mEq/L (ref 3–11)
BUN: 16.5 mg/dL (ref 7.0–26.0)
CALCIUM: 9.9 mg/dL (ref 8.4–10.4)
CHLORIDE: 106 meq/L (ref 98–109)
CO2: 25 meq/L (ref 22–29)
Creatinine: 1.1 mg/dL (ref 0.7–1.3)
Glucose: 84 mg/dl (ref 70–140)
POTASSIUM: 4.5 meq/L (ref 3.5–5.1)
SODIUM: 138 meq/L (ref 136–145)
TOTAL PROTEIN: 6.8 g/dL (ref 6.4–8.3)
Total Bilirubin: <0.2 mg/dL (ref 0.20–1.20)

## 2014-10-11 LAB — CBC WITH DIFFERENTIAL/PLATELET
BASO%: 0.2 % (ref 0.0–2.0)
BASOS ABS: 0 10*3/uL (ref 0.0–0.1)
EOS ABS: 0 10*3/uL (ref 0.0–0.5)
EOS%: 0.1 % (ref 0.0–7.0)
HEMATOCRIT: 26.7 % — AB (ref 38.4–49.9)
HGB: 8.4 g/dL — ABNORMAL LOW (ref 13.0–17.1)
LYMPH%: 10.3 % — ABNORMAL LOW (ref 14.0–49.0)
MCH: 30.2 pg (ref 27.2–33.4)
MCHC: 31.5 g/dL — ABNORMAL LOW (ref 32.0–36.0)
MCV: 96 fL (ref 79.3–98.0)
MONO#: 1.1 10*3/uL — AB (ref 0.1–0.9)
MONO%: 9.3 % (ref 0.0–14.0)
NEUT%: 80.1 % — AB (ref 39.0–75.0)
NEUTROS ABS: 9.5 10*3/uL — AB (ref 1.5–6.5)
Platelets: 45 10*3/uL — ABNORMAL LOW (ref 140–400)
RBC: 2.78 10*6/uL — ABNORMAL LOW (ref 4.20–5.82)
RDW: 18.2 % — AB (ref 11.0–14.6)
WBC: 11.8 10*3/uL — AB (ref 4.0–10.3)
lymph#: 1.2 10*3/uL (ref 0.9–3.3)

## 2014-10-11 MED ORDER — OXYCODONE HCL 5 MG PO TABS: 5.0000 mg | ORAL_TABLET | ORAL | Status: DC | PRN

## 2014-10-11 MED ORDER — PROMETHAZINE HCL 25 MG PO TABS: 25.0000 mg | ORAL_TABLET | Freq: Four times a day (QID) | ORAL | Status: AC | PRN

## 2014-10-11 MED ORDER — IOHEXOL 300 MG/ML  SOLN
50.0000 mL | Freq: Once | INTRAMUSCULAR | Status: AC | PRN
  Administered 2014-10-11: 50 mL via ORAL

## 2014-10-13 ENCOUNTER — Other Ambulatory Visit: Payer: Medicaid Other

## 2014-10-14 ENCOUNTER — Encounter (HOSPITAL_COMMUNITY): Payer: Self-pay | Admitting: Emergency Medicine

## 2014-10-14 ENCOUNTER — Emergency Department (HOSPITAL_COMMUNITY): Payer: Medicaid Other

## 2014-10-14 ENCOUNTER — Emergency Department (HOSPITAL_COMMUNITY)
Admission: EM | Admit: 2014-10-14 | Discharge: 2014-10-15 | Disposition: A | Discharge status: Home / Self Care | Payer: Medicaid Other | Attending: Emergency Medicine | Admitting: Emergency Medicine

## 2014-10-14 DIAGNOSIS — R109 Unspecified abdominal pain: Secondary | ICD-10-CM

## 2014-10-14 DIAGNOSIS — Z720 Tobacco use: Secondary | ICD-10-CM | POA: Diagnosis not present

## 2014-10-14 DIAGNOSIS — R1084 Generalized abdominal pain: Secondary | ICD-10-CM | POA: Insufficient documentation

## 2014-10-14 DIAGNOSIS — C3490 Malignant neoplasm of unspecified part of unspecified bronchus or lung: Secondary | ICD-10-CM | POA: Insufficient documentation

## 2014-10-14 DIAGNOSIS — C787 Secondary malignant neoplasm of liver and intrahepatic bile duct: Secondary | ICD-10-CM | POA: Insufficient documentation

## 2014-10-14 DIAGNOSIS — C7900 Secondary malignant neoplasm of unspecified kidney and renal pelvis: Secondary | ICD-10-CM | POA: Insufficient documentation

## 2014-10-14 DIAGNOSIS — D649 Anemia, unspecified: Secondary | ICD-10-CM | POA: Diagnosis not present

## 2014-10-14 MED ORDER — HYDROMORPHONE HCL 1 MG/ML IJ SOLN
0.5000 mg | Freq: Once | INTRAMUSCULAR | Status: AC
  Administered 2014-10-14: 0.5 mg via INTRAVENOUS
  Filled 2014-10-14: qty 1

## 2014-10-14 MED ORDER — SODIUM CHLORIDE 0.9 % IV BOLUS (SEPSIS)
1000.0000 mL | Freq: Once | INTRAVENOUS | Status: AC
  Administered 2014-10-14: 1000 mL via INTRAVENOUS

## 2014-10-14 NOTE — ED Notes (Signed)
Per EMS pt from home, has stage 4 lung ca, that is now throughout body and has stopped all treatment, now complaining of upper epigastric pain x 2 days, denies n/v/d, pt a/o x 3.

## 2014-10-14 NOTE — ED Notes (Signed)
Pt states he's having abdominal pain "for a day or so", pt states "I need pain medication now, I need pain control", pt denies n/v/d.

## 2014-10-14 NOTE — ED Notes (Signed)
Bed: WA09 Expected date: 10/14/14 Expected time: 10:37 PM Means of arrival: Ambulance Comments: 63 yo M  Lung cancer, epigastric pain

## 2014-10-15 LAB — CBC WITH DIFFERENTIAL/PLATELET
BASOS ABS: 0 10*3/uL (ref 0.0–0.1)
BASOS PCT: 0 % (ref 0–1)
Eosinophils Absolute: 0 10*3/uL (ref 0.0–0.7)
Eosinophils Relative: 0 % (ref 0–5)
HCT: 22.8 % — ABNORMAL LOW (ref 39.0–52.0)
Hemoglobin: 7.3 g/dL — ABNORMAL LOW (ref 13.0–17.0)
Lymphocytes Relative: 10 % — ABNORMAL LOW (ref 12–46)
Lymphs Abs: 0.7 10*3/uL (ref 0.7–4.0)
MCH: 30.2 pg (ref 26.0–34.0)
MCHC: 32 g/dL (ref 30.0–36.0)
MCV: 94.2 fL (ref 78.0–100.0)
Monocytes Absolute: 1.3 10*3/uL — ABNORMAL HIGH (ref 0.1–1.0)
Monocytes Relative: 18 % — ABNORMAL HIGH (ref 3–12)
NEUTROS ABS: 5.1 10*3/uL (ref 1.7–7.7)
Neutrophils Relative %: 72 % (ref 43–77)
PLATELETS: 93 10*3/uL — AB (ref 150–400)
RBC: 2.42 MIL/uL — ABNORMAL LOW (ref 4.22–5.81)
RDW: 18.8 % — AB (ref 11.5–15.5)
WBC: 7.2 10*3/uL (ref 4.0–10.5)

## 2014-10-15 LAB — COMPREHENSIVE METABOLIC PANEL
ALBUMIN: 3 g/dL — AB (ref 3.5–5.2)
ALT: 8 U/L (ref 0–53)
AST: 15 U/L (ref 0–37)
Alkaline Phosphatase: 117 U/L (ref 39–117)
Anion gap: 9 (ref 5–15)
BUN: 15 mg/dL (ref 6–23)
CO2: 31 meq/L (ref 19–32)
Calcium: 9.2 mg/dL (ref 8.4–10.5)
Chloride: 103 mEq/L (ref 96–112)
Creatinine, Ser: 1.45 mg/dL — ABNORMAL HIGH (ref 0.50–1.35)
GFR calc Af Amer: 58 mL/min — ABNORMAL LOW (ref >90)
GFR calc non Af Amer: 50 mL/min — ABNORMAL LOW (ref >90)
Glucose, Bld: 86 mg/dL (ref 70–99)
Potassium: 4.1 mEq/L (ref 3.7–5.3)
Sodium: 143 mEq/L (ref 137–147)
Total Protein: 6.4 g/dL (ref 6.0–8.3)

## 2014-10-15 MED ORDER — POLYETHYLENE GLYCOL 3350 17 G PO PACK: 17.0000 g | PACK | Freq: Every day | ORAL | Status: AC

## 2014-10-15 MED ORDER — DOCUSATE SODIUM 100 MG PO CAPS: 100.0000 mg | ORAL_CAPSULE | Freq: Two times a day (BID) | ORAL | Status: AC

## 2014-10-15 MED ORDER — HYDROMORPHONE HCL 2 MG PO TABS: 1.0000 mg | ORAL_TABLET | Freq: Four times a day (QID) | ORAL | Status: DC | PRN

## 2014-10-15 NOTE — Discharge Instructions (Signed)
Take stool softeners.  Return to the ER for worsening condition or new concerning symptoms.  Follow up with your cancer doctor as scheduled.   Abdominal Pain Many things can cause abdominal pain. Usually, abdominal pain is not caused by a disease and will improve without treatment. It can often be observed and treated at home. Your health care provider will do a physical exam and possibly order blood tests and X-rays to help determine the seriousness of your pain. However, in many cases, more time must pass before a clear cause of the pain can be found. Before that point, your health care provider may not know if you need more testing or further treatment. HOME CARE INSTRUCTIONS  Monitor your abdominal pain for any changes. The following actions may help to alleviate any discomfort you are experiencing:  Only take over-the-counter or prescription medicines as directed by your health care provider.  Do not take laxatives unless directed to do so by your health care provider.  Try a clear liquid diet (broth, tea, or water) as directed by your health care provider. Slowly move to a bland diet as tolerated. SEEK MEDICAL CARE IF:  You have unexplained abdominal pain.  You have abdominal pain associated with nausea or diarrhea.  You have pain when you urinate or have a bowel movement.  You experience abdominal pain that wakes you in the night.  You have abdominal pain that is worsened or improved by eating food.  You have abdominal pain that is worsened with eating fatty foods.  You have a fever. SEEK IMMEDIATE MEDICAL CARE IF:   Your pain does not go away within 2 hours.  You keep throwing up (vomiting).  Your pain is felt only in portions of the abdomen, such as the right side or the left lower portion of the abdomen.  You pass bloody or black tarry stools. MAKE SURE YOU:  Understand these instructions.   Will watch your condition.   Will get help right away if you are not  doing well or get worse.  Document Released: 09/12/2005 Document Revised: 12/08/2013 Document Reviewed: 08/12/2013 [facility] Patient Information 2015 [place], [place]. This information is not intended to replace advice given to you by your health care provider. Make sure you discuss any questions you have with your health care provider.

## 2014-10-15 NOTE — ED Notes (Signed)
Pt given urinal to give urine sample.

## 2014-10-15 NOTE — ED Provider Notes (Signed)
CSN: [id number]     Arrival date & time 10/14/14  2250 History   First MD Initiated Contact with Patient 10/14/14 2302     Chief Complaint  Patient presents with  . Abdominal Pain     (Consider location/radiation/quality/duration/timing/severity/associated sxs/prior Treatment) HPI 63 year old male presents to the emergency department with abdominal pain.  Patient has history of lung cancer that is widely metastatic, with known metastasis to kidney and liver.  Patient had CT scan done on Monday for staging for his chemotherapy treatment.  He reports a day or so ago his pain worsened.  He denies any nausea or vomiting, no fever.  He reports having normal bowel movements.  Pain is diffuse throughout the abdomen.  He has been taking his oxycodone and Phenergan without relief.  He has follow-up with his oncologist on the fourth. Past Medical History  Diagnosis Date  . Cancer     lung ca dx'd 04/2013  . Cancer 11/30/13 ct    rt para renal mass metastatic  . History of radiation therapy 01/07/14-01/26/14    abdomen/30 Gy/27fx  . Hx of radiation therapy 04/05/14-04/09/14    palliative left femur, 20Gy   History reviewed. No pertinent past surgical history. Family History  Problem Relation Age of Onset  . Aneurysm Mother   . COPD Father   . CAD Brother    History  Substance Use Topics  . Smoking status: Current Every Day Smoker -- 2.00 packs/day for 15 years    Types: Cigarettes  . Smokeless tobacco: Never Used  . Alcohol Use: No    Review of Systems   See History of Present Illness; otherwise all other systems are reviewed and negative  Allergies  Review of patient's allergies indicates no known allergies.  Home Medications   Prior to Admission medications   Medication Sig Start Date End Date Taking? Authorizing Provider  oxyCODONE (OXY IR/ROXICODONE) 5 MG immediate release tablet Take 1-2 tablets (5-10 mg total) by mouth every 4 (four) hours as needed for severe pain. 10/11/14   Yes Adrena E Johnson, PA-C  promethazine (PHENERGAN) 25 MG tablet Take 1 tablet (25 mg total) by mouth every 6 (six) hours as needed for nausea or vomiting. 10/11/14  Yes Adrena E Johnson, PA-C  ondansetron (ZOFRAN-ODT) 8 MG disintegrating tablet Take 8 mg by mouth every 8 (eight) hours as needed for nausea or vomiting.    Historical Provider, MD   BP 93/54  Pulse 80  Temp(Src) 98 F (36.7 C) (Oral)  Resp 18  SpO2 100% Physical Exam  Nursing note and vitals reviewed. Constitutional: He is oriented to person, place, and time. He appears well-developed and well-nourished. He appears distressed.  Chronically ill-appearing male, uncomfortable appearing  HENT:  Head: Normocephalic and atraumatic.  Nose: Nose normal.  Mouth/Throat: Oropharynx is clear and moist.  Eyes: Conjunctivae and EOM are normal. Pupils are equal, round, and reactive to light.  Neck: Normal range of motion. Neck supple. No JVD present. No tracheal deviation present. No thyromegaly present.  Cardiovascular: Normal rate, regular rhythm, normal heart sounds and intact distal pulses.  Exam reveals no gallop and no friction rub.   No murmur heard. Pulmonary/Chest: Effort normal and breath sounds normal. No stridor. No respiratory distress. He has no wheezes. He has no rales. He exhibits no tenderness.  Abdominal: Soft. Bowel sounds are normal. He exhibits mass (patient has hepatomegaly palpated). He exhibits no distension. There is tenderness (patient has tenderness throughout the abdomen). There is no rebound and no  guarding.  Musculoskeletal: Normal range of motion. He exhibits no edema and no tenderness.  Lymphadenopathy:    He has no cervical adenopathy.  Neurological: He is alert and oriented to person, place, and time. He displays normal reflexes. He exhibits normal muscle tone. Coordination normal.  Skin: Skin is warm and dry. No rash noted. No erythema. No pallor.  Psychiatric: He has a normal mood and affect. His  behavior is normal. Judgment and thought content normal.    ED Course  Procedures (including critical care time) Labs Review Labs Reviewed  CBC WITH DIFFERENTIAL - Abnormal; Notable for the following:    RBC 2.42 (*)    Hemoglobin 7.3 (*)    HCT 22.8 (*)    RDW 18.8 (*)    Platelets 93 (*)    Lymphocytes Relative 10 (*)    Monocytes Relative 18 (*)    Monocytes Absolute 1.3 (*)    All other components within normal limits  COMPREHENSIVE METABOLIC PANEL - Abnormal; Notable for the following:    Creatinine, Ser 1.45 (*)    Albumin 3.0 (*)    Total Bilirubin <0.2 (*)    GFR calc non Af Amer 50 (*)    GFR calc Af Amer 58 (*)    All other components within normal limits  URINALYSIS, ROUTINE W REFLEX MICROSCOPIC    Imaging Review Dg Abd Acute W/chest  10/15/2014   CLINICAL DATA:  Epigastric pain for 24 hr.  Lung cancer.  EXAM: ACUTE ABDOMEN SERIES (ABDOMEN 2 VIEW & CHEST 1 VIEW)  COMPARISON:  Abdominal CT 10/11/2014  FINDINGS: Pulmonary hyperinflation with apical emphysema. Treated lung cancer at the left apex with radiation fibrosis. There has been recent chest CT imaging for staging. No acute intrathoracic findings such is edema, effusion, pneumonia, or pneumothorax.  Prominent high-density stool in the proximal and mid colon related to recent abdominal CT with oral contrast. No evidence for small bowel obstruction. No pneumoperitoneum. No concerning intra-abdominal mass effect or calcification.  Bony metastatic disease is not well resolved.  IMPRESSION: 1. Constipation. 2. Negative for bowel obstruction or perforation. 3. Emphysema and treatment changes for lung cancer. No acute intrathoracic findings.   Electronically Signed   By: Jorje Guild M.D.   On: 10/15/2014 01:13     EKG Interpretation None      MDM   Final diagnoses:  Abdominal pain   63 year old male with acute on chronic abdominal pain secondary to metastasis.  Plan for labs fluids and pain medications.  Kalman Drape, MD 10/15/14 [phone]

## 2014-10-15 NOTE — ED Notes (Signed)
Pt denies the need to urinate at this time. Urinal at the bedside.

## 2014-10-15 NOTE — ED Notes (Signed)
Patient transported to X-ray 

## 2014-10-20 ENCOUNTER — Other Ambulatory Visit (HOSPITAL_BASED_OUTPATIENT_CLINIC_OR_DEPARTMENT_OTHER): Payer: Medicaid Other

## 2014-10-20 ENCOUNTER — Telehealth: Payer: Self-pay | Admitting: Internal Medicine

## 2014-10-20 ENCOUNTER — Ambulatory Visit: Payer: Medicaid Other

## 2014-10-20 ENCOUNTER — Encounter: Payer: Self-pay | Admitting: Internal Medicine

## 2014-10-20 ENCOUNTER — Ambulatory Visit (HOSPITAL_BASED_OUTPATIENT_CLINIC_OR_DEPARTMENT_OTHER): Payer: Medicaid Other | Admitting: Internal Medicine

## 2014-10-20 VITALS — BP 117/69 | HR 87 | Temp 98.0°F | Resp 18 | Ht 70.0 in | Wt 131.2 lb

## 2014-10-20 DIAGNOSIS — R5383 Other fatigue: Secondary | ICD-10-CM

## 2014-10-20 DIAGNOSIS — C7970 Secondary malignant neoplasm of unspecified adrenal gland: Secondary | ICD-10-CM

## 2014-10-20 DIAGNOSIS — C3490 Malignant neoplasm of unspecified part of unspecified bronchus or lung: Secondary | ICD-10-CM

## 2014-10-20 DIAGNOSIS — C3412 Malignant neoplasm of upper lobe, left bronchus or lung: Secondary | ICD-10-CM

## 2014-10-20 DIAGNOSIS — G893 Neoplasm related pain (acute) (chronic): Secondary | ICD-10-CM

## 2014-10-20 LAB — CBC WITH DIFFERENTIAL/PLATELET
BASO%: 1.4 % (ref 0.0–2.0)
Basophils Absolute: 0.1 10*3/uL (ref 0.0–0.1)
EOS ABS: 0 10*3/uL (ref 0.0–0.5)
EOS%: 0.2 % (ref 0.0–7.0)
HEMATOCRIT: 28.9 % — AB (ref 38.4–49.9)
HGB: 9.2 g/dL — ABNORMAL LOW (ref 13.0–17.1)
LYMPH%: 9.1 % — ABNORMAL LOW (ref 14.0–49.0)
MCH: 29.8 pg (ref 27.2–33.4)
MCHC: 31.7 g/dL — AB (ref 32.0–36.0)
MCV: 94.1 fL (ref 79.3–98.0)
MONO#: 1.1 10*3/uL — ABNORMAL HIGH (ref 0.1–0.9)
MONO%: 14.2 % — AB (ref 0.0–14.0)
NEUT#: 5.6 10*3/uL (ref 1.5–6.5)
NEUT%: 75.1 % — ABNORMAL HIGH (ref 39.0–75.0)
Platelets: 215 10*3/uL (ref 140–400)
RBC: 3.07 10*6/uL — AB (ref 4.20–5.82)
RDW: 20.5 % — ABNORMAL HIGH (ref 11.0–14.6)
WBC: 7.5 10*3/uL (ref 4.0–10.3)
lymph#: 0.7 10*3/uL — ABNORMAL LOW (ref 0.9–3.3)

## 2014-10-20 LAB — COMPREHENSIVE METABOLIC PANEL (CC13)
ALK PHOS: 141 U/L (ref 40–150)
ALT: 9 U/L (ref 0–55)
ANION GAP: 12 meq/L — AB (ref 3–11)
AST: 20 U/L (ref 5–34)
Albumin: 3.8 g/dL (ref 3.5–5.0)
BILIRUBIN TOTAL: 0.25 mg/dL (ref 0.20–1.20)
BUN: 31.2 mg/dL — AB (ref 7.0–26.0)
CO2: 25 mEq/L (ref 22–29)
Calcium: 10.3 mg/dL (ref 8.4–10.4)
Chloride: 101 mEq/L (ref 98–109)
Creatinine: 1.3 mg/dL (ref 0.7–1.3)
Glucose: 102 mg/dl (ref 70–140)
Potassium: 3.4 mEq/L — ABNORMAL LOW (ref 3.5–5.1)
SODIUM: 138 meq/L (ref 136–145)
TOTAL PROTEIN: 7.6 g/dL (ref 6.4–8.3)

## 2014-10-20 MED ORDER — OXYCODONE HCL 5 MG PO TABS: 5.0000 mg | ORAL_TABLET | ORAL | Status: DC | PRN

## 2014-10-20 NOTE — Telephone Encounter (Signed)
gv and printed appt sched and avs for pt for NOV and Jan 2016.Marland KitchenMarland Kitchenpt sched to see Dr. Lisbeth Renshaw on 11.9 @ 4pm

## 2014-10-20 NOTE — Progress Notes (Signed)
[place] [phone]   [phone]  OFFICE PROGRESS NOTE  No PCP Per Patient No address on file  DIAGNOSIS AND STAGE: Extensive stage small cell lung cancer diagnosed in May of 2014   PRIOR THERAPY:  1) whole Brain irradiation as well as palliative radiotherapy to the chest mass.  2) Systemic chemotherapy with carboplatin for AUC of 5 on day 1 and etoposide 120 mg/M2 on days 1, 2 and 3 with Neulasta support on day 4, status post 5 cycles. First cycle was started on 05/25/2013.  3) palliative radiotherapy to the abdomen metastasis adjacent to the right kidney under the care of Dr. Lisbeth Renshaw completed on 01/26/2014. 4) palliative radiotherapy to the left hip under the care of Dr. Lisbeth Renshaw. 5) Systemic chemotherapy with carboplatin for AUC of 5 on day 1 and etoposide 100 MG/M2 on days 1, 2 and 3 with Neulasta support on day 4 status post 5 cycles. Last cycle was given 09/22/2014.  CURRENT THERAPY: None  CHEMOTHERAPY INTENT: Palliative  CURRENT # OF CHEMOTHERAPY CYCLES: 5 CURRENT ANTIEMETICS: Zofran, dexamethasone and Compazine  CURRENT SMOKING STATUS: Current smoker and strongly advised to quit smoking and offered smoke cessation program  ORAL CHEMOTHERAPY AND CONSENT: None  CURRENT BISPHOSPHONATES USE: None  PAIN MANAGEMENT: Well-managed with Percocet when necessary  NARCOTICS INDUCED CONSTIPATION: No constipation but has a stool softener at home  LIVING WILL AND CODE STATUS: Discussed with the patient and he is still Full code.   INTERVAL HISTORY: Jacob Webb 63 y.o. male returns to the clinic today for followup visit. The patient is feeling fine today with no specific complaints except for fatigue and and lower back pain. He denied having any significant chest pain but continues to have shortness of breath with exertion with no cough or hemoptysis. He denied having any fever or chills.  The patient denied having any significant weight loss or  night sweats. He completed 5 cycles of systemic chemotherapy was carboplatin and etoposide and tolerated his treatment well. The patient had repeat CT scan of the chest, abdomen and pelvis performed recently and he is here for evaluation and discussion of his scan results.   MEDICAL HISTORY: Past Medical History  Diagnosis Date  . Cancer     lung ca dx'd 04/2013  . Cancer 11/30/13 ct    rt para renal mass metastatic  . History of radiation therapy 01/07/14-01/26/14    abdomen/30 Gy/35fx  . Hx of radiation therapy 04/05/14-04/09/14    palliative left femur, 20Gy    ALLERGIES:  has No Known Allergies.  MEDICATIONS:  Current Outpatient Prescriptions  Medication Sig Dispense Refill  . docusate sodium (COLACE) 100 MG capsule Take 1 capsule (100 mg total) by mouth 2 (two) times daily. 10 capsule 0  . HYDROmorphone (DILAUDID) 2 MG tablet Take 0.5 tablets (1 mg total) by mouth every 6 (six) hours as needed for severe pain. 10 tablet 0  . ondansetron (ZOFRAN-ODT) 8 MG disintegrating tablet Take 8 mg by mouth every 8 (eight) hours as needed for nausea or vomiting.    Marland Kitchen oxyCODONE (OXY IR/ROXICODONE) 5 MG immediate release tablet Take 1-2 tablets (5-10 mg total) by mouth every 4 (four) hours as needed for severe pain. 60 tablet 0  . polyethylene glycol (MIRALAX / GLYCOLAX) packet Take 17 g by mouth daily. 14 each 0  . promethazine (PHENERGAN) 25 MG tablet Take 1 tablet (25 mg total) by mouth every 6 (six) hours as needed for nausea  or vomiting. 30 tablet 0   No current facility-administered medications for this visit.   REVIEW OF SYSTEMS:  Constitutional: positive for fatigue Eyes: negative Ears, nose, mouth, throat, and face: negative Respiratory: positive for dyspnea on exertion Cardiovascular: negative Gastrointestinal: negative Genitourinary:negative Integument/breast: negative Hematologic/lymphatic: negative Musculoskeletal:positive for back pain Neurological: negative Behavioral/Psych:  negative Endocrine: negative Allergic/Immunologic: negative   PHYSICAL EXAMINATION: General appearance: alert, cooperative, fatigued and no distress Head: Normocephalic, without obvious abnormality, atraumatic Neck: no adenopathy Lymph nodes: Cervical, supraclavicular, and axillary nodes normal. Resp: clear to auscultation bilaterally Back: symmetric, no curvature. ROM normal. No CVA tenderness. Cardio: regular rate and rhythm, S1, S2 normal, no murmur, click, rub or gallop GI: soft, non-tender; bowel sounds normal; no masses,  no organomegaly Extremities: extremities normal, atraumatic, no cyanosis or edema Neurologic: Alert and oriented X 3, normal strength and tone. Normal symmetric reflexes. Normal coordination and gait  ECOG PERFORMANCE STATUS: 2 - Symptomatic, <50% confined to bed  Blood pressure 117/69, pulse 87, temperature 98 F (36.7 C), temperature source Oral, resp. rate 18, height 5\' 10"  (1.778 m), weight 131 lb 3.2 oz (59.512 kg).  LABORATORY DATA: Lab Results  Component Value Date   WBC 7.5 10/20/2014   HGB 9.2* 10/20/2014   HCT 28.9* 10/20/2014   MCV 94.1 10/20/2014   PLT 215 10/20/2014      Chemistry      Component Value Date/Time   NA 143 10/14/2014 2356   NA 138 10/11/2014 1115   K 4.1 10/14/2014 2356   K 4.5 10/11/2014 1115   CL 103 10/14/2014 2356   CL 105 06/08/2013 1009   CO2 31 10/14/2014 2356   CO2 25 10/11/2014 1115   BUN 15 10/14/2014 2356   BUN 16.5 10/11/2014 1115   CREATININE 1.45* 10/14/2014 2356   CREATININE 1.1 10/11/2014 1115      Component Value Date/Time   CALCIUM 9.2 10/14/2014 2356   CALCIUM 9.9 10/11/2014 1115   ALKPHOS 117 10/14/2014 2356   ALKPHOS 132 10/11/2014 1115   AST 15 10/14/2014 2356   AST 18 10/11/2014 1115   ALT 8 10/14/2014 2356   ALT 13 10/11/2014 1115   BILITOT <0.2* 10/14/2014 2356   BILITOT <0.20 10/11/2014 1115       RADIOGRAPHIC STUDIES: Ct Abdomen Pelvis Wo Contrast  10/11/2014   CLINICAL DATA:   Metastatic lung cancer  EXAM: CT CHEST, ABDOMEN, AND PELVIS WITH CONTRAST  TECHNIQUE: Multidetector CT imaging of the chest, abdomen and pelvis was performed following the standard protocol during bolus administration of intravenous contrast.  CONTRAST:  58mL OMNIPAQUE IOHEXOL 300 MG/ML  SOLN  COMPARISON:  CT chest 07/05/2014, CT abdomen pelvis from 08/03/2014.  FINDINGS: CT CHEST FINDINGS  Mediastinum: Normal heart size. Calcified atherosclerotic plaque involves the thoracic aorta. No mediastinal or hilar adenopathy. There is no axillary or supraclavicular adenopathy.  Lungs/Pleura: No pleural effusions identified. Moderate to advanced changes of centrilobular and paraseptal emphysema identified. Paramediastinal radiation change identified within the left lung. This is similar to previous exam. No suspicious pulmonary nodule or mass identified to suggest local recurrence of disease.  Musculoskeletal: No aggressive lytic or sclerotic bone lesions identified.  CT ABDOMEN AND PELVIS FINDINGS  Hepatobiliary: Index lesion within the right lobe of liver measures 9 mm, image number 61/series 2. This is stable from previous exam. Inferior right hepatic lobe lesion measures 1 cm, image 64/ series 2. Previously 1.1 cm. The index lesion within the inferior right hepatic lobe measures 1.6 cm, image 69/ series  2. Previously 1.7 cm. The gallbladder appears normal. There is no biliary dilatation.  Pancreas: Normal appearance of the pancreas.  Spleen: The spleen is unremarkable.  Adrenals/Urinary Tract: Right adrenal gland mass measures 2.2 x 2.0 cm, image 64/ series 2. Previously 2.8 x 2.7 cm. The upper pole right kidney mass measures 2.1 x 2.4 cm, image 68/series 2. Previously this measured 2.4 x 2.0 cm. Similar appearance of right hydronephrosis. The left kidney appears normal.  Stomach/Bowel: Normal appearance of the stomach. The small bowel loops have a normal course and caliber. No obstruction. Normal appearance of the  colon.  Vascular/Lymphatic: There is calcified atherosclerotic disease involving the abdominal aorta. No aneurysm. No retroperitoneal adenopathy identified. No mesenteric adenopathy. There is no pelvic or inguinal adenopathy.  Reproductive: Prostate gland and seminal vesicles are unremarkable.  Other: The previously referenced pericaval lesion has decreased in size. This measures 1.6 x 1.1 cm, image 44 of series 602. Previously 2.9 x 2.6 cm.  Musculoskeletal: Lytic lesion in the right iliac bone is identified measure 1.5 cm, image 92/series 2. This is unchanged from previous exam. I suspect there is probably a developing a lytic lesion involving the L3 vertebra. This measures roughly 2.7 cm, image 64/series 2. Bilateral L5 pars defects identified.  IMPRESSION: 1. No acute findings within the chest abdomen or pelvis. 2. No evidence for residual or recurrent tumor within the chest. 3. Stable appearance of multi focal liver lesions. 4. Decrease in size of pericapsular lesion and right adrenal metastasis. 5. 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 These have slowly evolved over the past 12 months. 6. Persistent right hydronephrosis.   Electronically Signed   By: Kerby Moors M.D.   On: 10/11/2014 11:11   Ct Chest Wo Contrast  10/11/2014   CLINICAL DATA:  Metastatic lung cancer  EXAM: CT CHEST, ABDOMEN, AND PELVIS WITH CONTRAST  TECHNIQUE: Multidetector CT imaging of the chest, abdomen and pelvis was performed following the standard protocol during bolus administration of intravenous contrast.  CONTRAST:  46mL OMNIPAQUE IOHEXOL 300 MG/ML  SOLN  COMPARISON:  CT chest 07/05/2014, CT abdomen pelvis from 08/03/2014.  FINDINGS: CT CHEST FINDINGS  Mediastinum: Normal heart size. Calcified atherosclerotic plaque involves the thoracic aorta. No mediastinal or hilar adenopathy. There is no axillary or supraclavicular adenopathy.  Lungs/Pleura: No pleural effusions identified.  Moderate to advanced changes of centrilobular and paraseptal emphysema identified. Paramediastinal radiation change identified within the left lung. This is similar to previous exam. No suspicious pulmonary nodule or mass identified to suggest local recurrence of disease.  Musculoskeletal: No aggressive lytic or sclerotic bone lesions identified.  CT ABDOMEN AND PELVIS FINDINGS  Hepatobiliary: Index lesion within the right lobe of liver measures 9 mm, image number 61/series 2. This is stable from previous exam. Inferior right hepatic lobe lesion measures 1 cm, image 64/ series 2. Previously 1.1 cm. The index lesion within the inferior right hepatic lobe measures 1.6 cm, image 69/ series 2. Previously 1.7 cm. The gallbladder appears normal. There is no biliary dilatation.  Pancreas: Normal appearance of the pancreas.  Spleen: The spleen is unremarkable.  Adrenals/Urinary Tract: Right adrenal gland mass measures 2.2 x 2.0 cm, image 64/ series 2. Previously 2.8 x 2.7 cm. The upper pole right kidney mass measures 2.1 x 2.4 cm, image 68/series 2. Previously this measured 2.4 x 2.0 cm. Similar appearance of right hydronephrosis. The left kidney appears normal.  Stomach/Bowel: Normal appearance of the stomach. The small  bowel loops have a normal course and caliber. No obstruction. Normal appearance of the colon.  Vascular/Lymphatic: There is calcified atherosclerotic disease involving the abdominal aorta. No aneurysm. No retroperitoneal adenopathy identified. No mesenteric adenopathy. There is no pelvic or inguinal adenopathy.  Reproductive: Prostate gland and seminal vesicles are unremarkable.  Other: The previously referenced pericaval lesion has decreased in size. This measures 1.6 x 1.1 cm, image 44 of series 602. Previously 2.9 x 2.6 cm.  Musculoskeletal: Lytic lesion in the right iliac bone is identified measure 1.5 cm, image 92/series 2. This is unchanged from previous exam. I suspect there is probably a developing  a lytic lesion involving the L3 vertebra. This measures roughly 2.7 cm, image 64/series 2. Bilateral L5 pars defects identified.  IMPRESSION: 1. No acute findings within the chest abdomen or pelvis. 2. No evidence for residual or recurrent tumor within the chest. 3. Stable appearance of multi focal liver lesions. 4. Decrease in size of pericapsular lesion and right adrenal metastasis. 5. 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 These have slowly evolved over the past 12 months. 6. Persistent right hydronephrosis.   Electronically Signed   By: Kerby Moors M.D.   On: 10/11/2014 11:11   Dg Abd Acute W/chest  10/15/2014   CLINICAL DATA:  Epigastric pain for 24 hr.  Lung cancer.  EXAM: ACUTE ABDOMEN SERIES (ABDOMEN 2 VIEW & CHEST 1 VIEW)  COMPARISON:  Abdominal CT 10/11/2014  FINDINGS: Pulmonary hyperinflation with apical emphysema. Treated lung cancer at the left apex with radiation fibrosis. There has been recent chest CT imaging for staging. No acute intrathoracic findings such is edema, effusion, pneumonia, or pneumothorax.  Prominent high-density stool in the proximal and mid colon related to recent abdominal CT with oral contrast. No evidence for small bowel obstruction. No pneumoperitoneum. No concerning intra-abdominal mass effect or calcification.  Bony metastatic disease is not well resolved.  IMPRESSION: 1. Constipation. 2. Negative for bowel obstruction or perforation. 3. Emphysema and treatment changes for lung cancer. No acute intrathoracic findings.   Electronically Signed   By: Jorje Guild M.D.   On: 10/15/2014 01:13   ASSESSMENT AND PLAN:  This is a very pleasant 63 years old white male with extensive stage small cell lung cancer: he completed 5 cycles of systemic chemotherapy with carboplatin and etoposide with significant improvement in his disease.  He has been observation for several months followed by disease recurrence and the patient was  started on a second course of systemic chemotherapy with carboplatin and etoposide status post 5 cycles. The recent CT scan of the chest, abdomen and pelvis showed stable disease with some improvement in the pericapsular lesion and right adrenal metastasis. I discussed the scan results with the patient today. I recommended for him to take a break off chemotherapy for now with repeat CT scan of the chest, abdomen and pelvis in 2 months for reevaluation of his disease. This will give the patient time to enjoy his holiday with his family.  For the low back pain, I will refer the patient to see Dr. Lisbeth Renshaw for consideration of palliative radiotherapy to the L3 lesion and right iliac bone. He was advised to call immediately if he has any concerning symptoms in the interval.  The patient voices understanding of current disease status and treatment options and is in agreement with the current care plan.  All questions were answered. The patient knows to call the clinic with any problems, questions  or concerns. We can certainly see the patient much sooner if necessary.  Disclaimer: This note was dictated with voice recognition software. Similar sounding words can inadvertently be transcribed and may not be corrected upon review.

## 2014-10-20 NOTE — Patient Instructions (Signed)
Smoking Cessation, Tips for Success  If you are ready to quit smoking, congratulations! You have chosen to help yourself be healthier. Cigarettes bring nicotine, tar, carbon monoxide, and other irritants into your body. Your lungs, heart, and blood vessels will be able to work better without these poisons. There are many different ways to quit smoking. Nicotine gum, nicotine patches, a nicotine inhaler, or nicotine nasal spray can help with physical craving. Hypnosis, support groups, and medicines help break the habit of smoking.  WHAT THINGS CAN I DO TO MAKE QUITTING EASIER?   Here are some tips to help you quit for good:  · Pick a date when you will quit smoking completely. Tell all of your friends and family about your plan to quit on that date.  · Do not try to slowly cut down on the number of cigarettes you are smoking. Pick a quit date and quit smoking completely starting on that day.  · Throw away all cigarettes.    · Clean and remove all ashtrays from your home, work, and car.  · On a card, write down your reasons for quitting. Carry the card with you and read it when you get the urge to smoke.  · Cleanse your body of nicotine. Drink enough water and fluids to keep your urine clear or pale yellow. Do this after quitting to flush the nicotine from your body.  · Learn to predict your moods. Do not let a bad situation be your excuse to have a cigarette. Some situations in your life might tempt you into wanting a cigarette.  · Never have "just one" cigarette. It leads to wanting another and another. Remind yourself of your decision to quit.  · Change habits associated with smoking. If you smoked while driving or when feeling stressed, try other activities to replace smoking. Stand up when drinking your coffee. Brush your teeth after eating. Sit in a different chair when you read the paper. Avoid alcohol while trying to quit, and try to drink fewer caffeinated beverages. Alcohol and caffeine may urge you to  smoke.  · Avoid foods and drinks that can trigger a desire to smoke, such as sugary or spicy foods and alcohol.  · Ask people who smoke not to smoke around you.  · Have something planned to do right after eating or having a cup of coffee. For example, plan to take a walk or exercise.  · Try a relaxation exercise to calm you down and decrease your stress. Remember, you may be tense and nervous for the first 2 weeks after you quit, but this will pass.  · Find new activities to keep your hands busy. Play with a pen, coin, or rubber band. Doodle or draw things on paper.  · Brush your teeth right after eating. This will help cut down on the craving for the taste of tobacco after meals. You can also try mouthwash.    · Use oral substitutes in place of cigarettes. Try using lemon drops, carrots, cinnamon sticks, or chewing gum. Keep them handy so they are available when you have the urge to smoke.  · When you have the urge to smoke, try deep breathing.  · Designate your home as a nonsmoking area.  · If you are a heavy smoker, ask your health care provider about a prescription for nicotine chewing gum. It can ease your withdrawal from nicotine.  · Reward yourself. Set aside the cigarette money you save and buy yourself something nice.  · Look for   support from others. Join a support group or smoking cessation program. Ask someone at home or at work to help you with your plan to quit smoking.  · Always ask yourself, "Do I need this cigarette or is this just a reflex?" Tell yourself, "Today, I choose not to smoke," or "I do not want to smoke." You are reminding yourself of your decision to quit.  · Do not replace cigarette smoking with electronic cigarettes (commonly called e-cigarettes). The safety of e-cigarettes is unknown, and some may contain harmful chemicals.  · If you relapse, do not give up! Plan ahead and think about what you will do the next time you get the urge to smoke.  HOW WILL I FEEL WHEN I QUIT SMOKING?  You  may have symptoms of withdrawal because your body is used to nicotine (the addictive substance in cigarettes). You may crave cigarettes, be irritable, feel very hungry, cough often, get headaches, or have difficulty concentrating. The withdrawal symptoms are only temporary. They are strongest when you first quit but will go away within 10-14 days. When withdrawal symptoms occur, stay in control. Think about your reasons for quitting. Remind yourself that these are signs that your body is healing and getting used to being without cigarettes. Remember that withdrawal symptoms are easier to treat than the major diseases that smoking can cause.   Even after the withdrawal is over, expect periodic urges to smoke. However, these cravings are generally short lived and will go away whether you smoke or not. Do not smoke!  WHAT RESOURCES ARE AVAILABLE TO HELP ME QUIT SMOKING?  Your health care provider can direct you to community resources or hospitals for support, which may include:  · Group support.  · Education.  · Hypnosis.  · Therapy.  Document Released: 08/31/2004 Document Revised: 04/19/2014 Document Reviewed: 05/21/2013  [employer]® Patient Information ©2015 [employer]. This information is not intended to replace advice given to you by your health care provider. Make sure you discuss any questions you have with your health care provider.

## 2014-10-21 ENCOUNTER — Ambulatory Visit: Payer: Medicaid Other

## 2014-10-22 ENCOUNTER — Ambulatory Visit: Payer: Medicaid Other

## 2014-10-23 ENCOUNTER — Ambulatory Visit: Payer: Medicaid Other

## 2014-10-25 ENCOUNTER — Telehealth: Payer: Self-pay | Admitting: Oncology

## 2014-10-25 ENCOUNTER — Ambulatory Visit
Admission: RE | Admit: 2014-10-25 | Discharge: 2014-10-25 | Disposition: A | Discharge status: Home / Self Care | Payer: Medicaid Other | Source: Ambulatory Visit | Attending: Radiation Oncology | Admitting: Radiation Oncology

## 2014-10-25 ENCOUNTER — Ambulatory Visit: Payer: Medicaid Other

## 2014-10-25 NOTE — Telephone Encounter (Signed)
Called Jacob Webb's home number which had a busy signal.  Left a message with Zigmund Daniel on his cell phone regarding his appointment with Dr. Lisbeth Renshaw at 4:00.  Requested a return call.

## 2014-10-27 ENCOUNTER — Other Ambulatory Visit: Payer: Medicaid Other

## 2014-10-28 ENCOUNTER — Telehealth: Payer: Self-pay | Admitting: *Deleted

## 2014-10-28 ENCOUNTER — Encounter: Payer: Self-pay | Admitting: *Deleted

## 2014-10-28 NOTE — Telephone Encounter (Signed)
Sent certified "No Show" letter today

## 2014-11-01 ENCOUNTER — Encounter: Payer: Self-pay | Admitting: Radiation Oncology

## 2014-11-05 ENCOUNTER — Ambulatory Visit (HOSPITAL_BASED_OUTPATIENT_CLINIC_OR_DEPARTMENT_OTHER): Payer: Medicaid Other | Admitting: Nurse Practitioner

## 2014-11-05 ENCOUNTER — Telehealth: Payer: Self-pay | Admitting: *Deleted

## 2014-11-05 ENCOUNTER — Encounter: Payer: Self-pay | Admitting: Nurse Practitioner

## 2014-11-05 ENCOUNTER — Telehealth: Payer: Self-pay | Admitting: Nurse Practitioner

## 2014-11-05 VITALS — BP 138/69 | HR 67 | Temp 97.9°F | Resp 18

## 2014-11-05 DIAGNOSIS — E860 Dehydration: Secondary | ICD-10-CM | POA: Insufficient documentation

## 2014-11-05 DIAGNOSIS — C3490 Malignant neoplasm of unspecified part of unspecified bronchus or lung: Secondary | ICD-10-CM

## 2014-11-05 DIAGNOSIS — G893 Neoplasm related pain (acute) (chronic): Secondary | ICD-10-CM

## 2014-11-05 MED ORDER — HYDROMORPHONE HCL 4 MG/ML IJ SOLN
2.0000 mg | Freq: Once | INTRAMUSCULAR | Status: AC
  Administered 2014-11-05: 2 mg via INTRAVENOUS

## 2014-11-05 MED ORDER — ONDANSETRON 8 MG/NS 50 ML IVPB
INTRAVENOUS | Status: AC
  Filled 2014-11-05: qty 8

## 2014-11-05 MED ORDER — HYDROMORPHONE HCL 2 MG PO TABS: 2.0000 mg | ORAL_TABLET | Freq: Four times a day (QID) | ORAL | Status: DC | PRN

## 2014-11-05 MED ORDER — SODIUM CHLORIDE 0.9 % IV SOLN
1000.0000 mL | Freq: Once | INTRAVENOUS | Status: AC
  Administered 2014-11-05: 1000 mL via INTRAVENOUS

## 2014-11-05 MED ORDER — ONDANSETRON 8 MG/50ML IVPB (CHCC)
8.0000 mg | Freq: Once | INTRAVENOUS | Status: AC
  Administered 2014-11-05: 8 mg via INTRAVENOUS

## 2014-11-05 MED ORDER — HYDROMORPHONE HCL 4 MG/ML IJ SOLN
INTRAMUSCULAR | Status: AC
  Filled 2014-11-05: qty 1

## 2014-11-05 NOTE — Assessment & Plan Note (Signed)
Patient suffers with chronic pain related to his previously diagnosed bone metastasis. He reports only having 2 of his Dilaudid tabs left; and he has been trying to use his leftover oxycodone with minimal effectiveness for the last few days. Patient was given dilaudid IV while at the [facility]; and was also given a prescription for Dilaudid.  Pt was advised to take only the dilaudid as directed; and to discontinue taking any of the oxycodone.  Pt stated understanding of all instructions.

## 2014-11-05 NOTE — Assessment & Plan Note (Signed)
Patient last received carboplatin/etoposide chemotherapy on 09/22/2014.  He is currently under observation only.  Patient's last restaging chest/abdomen/pelvis scan revealed stable disease.  Patient was a no-show with radiation oncologist Dr. Lisbeth Renshaw for consultation regarding possible palliative radiation.  Patient is scheduled for his next restaging scan on 12/20/2014.he is scheduled for his next visit here at the [facility] on 12/27/2013.  Patient was advised to call if he is any concerns in the interim.

## 2014-11-05 NOTE — Patient Instructions (Signed)

## 2014-11-05 NOTE — Telephone Encounter (Signed)
Per CB/NP pt added to CB schedule..... KJ

## 2014-11-05 NOTE — Progress Notes (Signed)
will   SYMPTOM MANAGEMENT CLINIC   HPI: Jacob Webb 63 y.o. male diagnosed with lung cancer.  Patient is status post carboplatin/etoposide chemotherapy regimen.  Currently on observation only.  Patient called the cancer Center today requesting urgent care visit.  Patient received his last chemotherapy on 09/22/2014.  He is currently on a chemotherapy holiday.  Patient states that he has almost completely run out of his Dilaudid tablets for pain management; and has been trying to take oxycodone to manage his pain.  He states that he presented to the [facility] today for a pain medication refill.  Patient was observed sitting on the sidewalk in the cancer center parking lot prior to coming into the [facility].  Upon questioning-patient states that he did not fall; but rather sat on the sidewalk to rest due to his significant lower back pain.   Patient denies any recent nausea, vomiting, or diarrhea.he denies any dysuria or other UTI symptoms.  He has denies any recent fevers or chills.  Also, patient was scheduled for a radiation oncology consult with Dr. Lisbeth Renshaw; but apparently missed his initial appointment for consideration of palliative radiation.  HPI  CURRENT THERAPY: No active treatment plan for patient.   ROS  Past Medical History  Diagnosis Date  . Cancer     lung ca dx'd 04/2013  . Cancer 11/30/13 ct    rt para renal mass metastatic  . History of radiation therapy 01/07/14-01/26/14    abdomen/30 Gy/63fx  . Hx of radiation therapy 04/05/14-04/09/14    palliative left femur, 20Gy    History reviewed. No pertinent past surgical history.  has Small cell lung cancer; Delirium; Agitation; Hypokalemia; Pancytopenia; Secondary malignant neoplasm of bone and bone marrow; Fatigue; Abdominal pain, unspecified site; Constipation due to opioid therapy; Neoplasm related pain; Fall; Hallucination; and Dehydration on his problem list.     has No Known Allergies.    Medication List        This list is accurate as of: 11/05/14  4:16 PM.  Always use your most recent med list.               docusate sodium 100 MG capsule  Commonly known as:  COLACE  Take 1 capsule (100 mg total) by mouth 2 (two) times daily.     HYDROmorphone 2 MG tablet  Commonly known as:  DILAUDID  Take 1 tablet (2 mg total) by mouth every 6 (six) hours as needed for severe pain (DO NOT TAKE OXYCODONE IF TAKING DIALUDID TABLETS. WILL MAKE DROWSY. DO NOT DRINK OR DRIVE WHEN TAKING THIS MED.).     ondansetron 8 MG disintegrating tablet  Commonly known as:  ZOFRAN-ODT  Take 8 mg by mouth every 8 (eight) hours as needed for nausea or vomiting.     polyethylene glycol packet  Commonly known as:  MIRALAX / GLYCOLAX  Take 17 g by mouth daily.     promethazine 25 MG tablet  Commonly known as:  PHENERGAN  Take 1 tablet (25 mg total) by mouth every 6 (six) hours as needed for nausea or vomiting.         PHYSICAL EXAMINATION  Blood pressure 138/69, pulse 67, temperature 97.9 F (36.6 C), temperature source Oral, resp. rate 18, SpO2 100 %.  Physical Exam  Constitutional: He is oriented to person, place, and time. Vital signs are normal. He appears unhealthy. He appears cachectic.  HENT:  Head: Normocephalic and atraumatic.  Mouth/Throat: Oropharynx is clear and moist.  Eyes:  Conjunctivae and EOM are normal. Pupils are equal, round, and reactive to light. Right eye exhibits no discharge. Left eye exhibits no discharge. No scleral icterus.  Neck: Normal range of motion. Neck supple. No JVD present. No tracheal deviation present. No thyromegaly present.  Cardiovascular: Normal rate, regular rhythm, normal heart sounds and intact distal pulses.   Pulmonary/Chest: Effort normal and breath sounds normal. No respiratory distress. He has no wheezes. He has no rales.  Abdominal: Soft. Bowel sounds are normal. He exhibits no distension and no mass. There is no tenderness. There is no rebound and no  guarding.  Musculoskeletal: Normal range of motion. He exhibits no edema or tenderness.  Lymphadenopathy:    He has no cervical adenopathy.  Neurological: He is alert and oriented to person, place, and time.  Skin: Skin is warm and dry. No rash noted. No erythema.  Psychiatric: Affect normal.  Nursing note and vitals reviewed.   LABORATORY DATA:. No visits with results within 3 Day(s) from this visit. Latest known visit with results is:  Appointment on 10/20/2014  Component Date Value Ref Range Status  . WBC 10/20/2014 7.5  4.0 - 10.3 10e3/uL Final  . NEUT# 10/20/2014 5.6  1.5 - 6.5 10e3/uL Final  . HGB 10/20/2014 9.2* 13.0 - 17.1 g/dL Final  . HCT 10/20/2014 28.9* 38.4 - 49.9 % Final  . Platelets 10/20/2014 215  140 - 400 10e3/uL Final  . MCV 10/20/2014 94.1  79.3 - 98.0 fL Final  . MCH 10/20/2014 29.8  27.2 - 33.4 pg Final  . MCHC 10/20/2014 31.7* 32.0 - 36.0 g/dL Final  . RBC 10/20/2014 3.07* 4.20 - 5.82 10e6/uL Final  . RDW 10/20/2014 20.5* 11.0 - 14.6 % Final  . lymph# 10/20/2014 0.7* 0.9 - 3.3 10e3/uL Final  . MONO# 10/20/2014 1.1* 0.1 - 0.9 10e3/uL Final  . Eosinophils Absolute 10/20/2014 0.0  0.0 - 0.5 10e3/uL Final  . Basophils Absolute 10/20/2014 0.1  0.0 - 0.1 10e3/uL Final  . NEUT% 10/20/2014 75.1* 39.0 - 75.0 % Final  . LYMPH% 10/20/2014 9.1* 14.0 - 49.0 % Final  . MONO% 10/20/2014 14.2* 0.0 - 14.0 % Final  . EOS% 10/20/2014 0.2  0.0 - 7.0 % Final  . BASO% 10/20/2014 1.4  0.0 - 2.0 % Final  . Sodium 10/20/2014 138  136 - 145 mEq/L Final  . Potassium 10/20/2014 3.4* 3.5 - 5.1 mEq/L Final  . Chloride 10/20/2014 101  98 - 109 mEq/L Final  . CO2 10/20/2014 25  22 - 29 mEq/L Final  . Glucose 10/20/2014 102  70 - 140 mg/dl Final  . BUN 10/20/2014 31.2* 7.0 - 26.0 mg/dL Final  . Creatinine 10/20/2014 1.3  0.7 - 1.3 mg/dL Final  . Total Bilirubin 10/20/2014 0.25  0.20 - 1.20 mg/dL Final  . Alkaline Phosphatase 10/20/2014 141  40 - 150 U/L Final  . AST 10/20/2014 20  5  - 34 U/L Final  . ALT 10/20/2014 9  0 - 55 U/L Final  . Total Protein 10/20/2014 7.6  6.4 - 8.3 g/dL Final  . Albumin 10/20/2014 3.8  3.5 - 5.0 g/dL Final  . Calcium 10/20/2014 10.3  8.4 - 10.4 mg/dL Final  . Anion Gap 10/20/2014 12* 3 - 11 mEq/L Final     RADIOGRAPHIC STUDIES: No results found.  ASSESSMENT/PLAN:    Dehydration Patient feels slightly dehydrated today.  He will receive 1 liter NS IV fluids today. Advised patient to push fluids over the weekend.   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 He reports only having 2 of his Dilaudid tabs left; and he has been trying to use his leftover oxycodone with minimal effectiveness for the last few days. Patient was given dilaudid IV while at the [facility]; and was also given a prescription for Dilaudid.  Pt was advised to take only the dilaudid as directed; and to discontinue taking any of the oxycodone.  Pt stated understanding of all instructions.   Small cell lung cancer Patient last received carboplatin/etoposide chemotherapy on 09/22/2014.  He is currently under observation only.  Patient's last restaging chest/abdomen/pelvis scan revealed stable disease.  Patient was a no-show with radiation oncologist Dr. Lisbeth Renshaw for consultation regarding possible palliative radiation.  Patient is scheduled for his next restaging scan on 12/20/2014.he is scheduled for his next visit here at the [facility] on 12/27/2013.  Patient was advised to call if he is any concerns in the interim.   Patient stated understanding of all instructions; and was in agreement with this plan of care. The patient knows to call the clinic with any problems, questions or concerns.   Review/collaboration with Dr. Julien Nordmann regarding all aspects of patient's visit today.   Total time spent with patient was 40 minutes;  with greater than 75 percent of that time spent in face to face counseling  regarding his symptoms and coordination of care and follow up.  Disclaimer: This note was dictated with voice recognition software. Similar sounding words can inadvertently be transcribed and may not be corrected upon review.   Drue Second, NP 11/05/2014

## 2014-11-05 NOTE — Progress Notes (Signed)
1520- Patient alert and oriented at time of discharge. Reports pain has subsided. Wheeled patient to end of sidewalk and walked patient to car. Patient steady with cane, very alert; refused to have someone pick him up to take him home. Patient voices understanding to call us with any questions or concerns. Was given prescription for dilaudid 2 mg to be filled, instructed patient per Selena Lesser NP to not take oxycodone or any other narcotic with this medication; voices understanding.

## 2014-11-05 NOTE — Telephone Encounter (Signed)
Was brought walkin form from [facility] in registration. Pt here at [facility] now - per Vaughan Basta he was found laying on the sidewalk outside. I found pt laying on couch by elevators in lobby. He states he is in excruciating pain and needs a refill on his pain medication. Got pt into wheelchair and added to see Selena Lesser, NP. Taken to exam room via wheelchair and Ubaldo Glassing, RN will get VS and pt changed into gown.

## 2014-11-05 NOTE — Assessment & Plan Note (Signed)
Patient feels slightly dehydrated today.  He will receive 1 liter NS IV fluids today. Advised patient to push fluids over the weekend.

## 2014-11-08 ENCOUNTER — Telehealth: Payer: Self-pay | Admitting: *Deleted

## 2014-11-08 NOTE — Telephone Encounter (Signed)
Unable to reach patient at contact #'s. Left VM for daughter to call or have [place] call office to let us know how he is feeling today.

## 2014-11-09 ENCOUNTER — Telehealth: Payer: Self-pay | Admitting: *Deleted

## 2014-11-09 NOTE — Telephone Encounter (Signed)
Called patient to ask about making an appt. To see Dr. Lisbeth Renshaw, lvm for a return call

## 2014-11-15 ENCOUNTER — Telehealth: Payer: Self-pay | Admitting: *Deleted

## 2014-11-15 NOTE — Telephone Encounter (Signed)
error 

## 2014-11-15 NOTE — Telephone Encounter (Signed)
Returned voice message to male voice from [phone], requesting appt time  pain mgt and transportation issues, she stated she didn't have a drivers license, called and spoke with patient, who was infomred he would need to se Dr.Moody  In order for more pain medication, had sent certified letter after missing 10/25/14 appt, and couldn't get in touch with the patient with telephone numbers in [place], [phone] is a new number, transferred to Earleen Newport, to schedule appt pt agreed 2:10 PM

## 2014-11-18 ENCOUNTER — Emergency Department (HOSPITAL_COMMUNITY)
Admission: EM | Admit: 2014-11-18 | Discharge: 2014-11-18 | Disposition: A | Discharge status: Home / Self Care | Payer: Medicaid Other | Attending: Emergency Medicine | Admitting: Emergency Medicine

## 2014-11-18 ENCOUNTER — Institutional Professional Consult (permissible substitution): Payer: Medicaid Other | Admitting: Radiation Oncology

## 2014-11-18 ENCOUNTER — Emergency Department (HOSPITAL_COMMUNITY): Payer: Medicaid Other

## 2014-11-18 ENCOUNTER — Encounter (HOSPITAL_COMMUNITY): Payer: Self-pay | Admitting: Emergency Medicine

## 2014-11-18 DIAGNOSIS — C7901 Secondary malignant neoplasm of right kidney and renal pelvis: Secondary | ICD-10-CM | POA: Insufficient documentation

## 2014-11-18 DIAGNOSIS — S3992XA Unspecified injury of lower back, initial encounter: Secondary | ICD-10-CM | POA: Insufficient documentation

## 2014-11-18 DIAGNOSIS — Y9289 Other specified places as the place of occurrence of the external cause: Secondary | ICD-10-CM | POA: Diagnosis not present

## 2014-11-18 DIAGNOSIS — W19XXXA Unspecified fall, initial encounter: Secondary | ICD-10-CM

## 2014-11-18 DIAGNOSIS — C3490 Malignant neoplasm of unspecified part of unspecified bronchus or lung: Secondary | ICD-10-CM | POA: Diagnosis not present

## 2014-11-18 DIAGNOSIS — R531 Weakness: Secondary | ICD-10-CM | POA: Diagnosis not present

## 2014-11-18 DIAGNOSIS — E872 Acidosis, unspecified: Secondary | ICD-10-CM

## 2014-11-18 DIAGNOSIS — Z923 Personal history of irradiation: Secondary | ICD-10-CM | POA: Diagnosis not present

## 2014-11-18 DIAGNOSIS — Z79899 Other long term (current) drug therapy: Secondary | ICD-10-CM | POA: Insufficient documentation

## 2014-11-18 DIAGNOSIS — Z720 Tobacco use: Secondary | ICD-10-CM | POA: Insufficient documentation

## 2014-11-18 DIAGNOSIS — Y9389 Activity, other specified: Secondary | ICD-10-CM | POA: Insufficient documentation

## 2014-11-18 DIAGNOSIS — Y998 Other external cause status: Secondary | ICD-10-CM | POA: Diagnosis not present

## 2014-11-18 HISTORY — DX: Hypercalcemia: E83.52

## 2014-11-18 LAB — CBC WITH DIFFERENTIAL/PLATELET
Basophils Absolute: 0 10*3/uL (ref 0.0–0.1)
Basophils Relative: 0 % (ref 0–1)
Eosinophils Absolute: 0 10*3/uL (ref 0.0–0.7)
Eosinophils Relative: 0 % (ref 0–5)
HCT: 32.1 % — ABNORMAL LOW (ref 39.0–52.0)
Hemoglobin: 10.3 g/dL — ABNORMAL LOW (ref 13.0–17.0)
Lymphocytes Relative: 7 % — ABNORMAL LOW (ref 12–46)
Lymphs Abs: 0.7 10*3/uL (ref 0.7–4.0)
MCH: 27.3 pg (ref 26.0–34.0)
MCHC: 32.1 g/dL (ref 30.0–36.0)
MCV: 85.1 fL (ref 78.0–100.0)
Monocytes Absolute: 1 10*3/uL (ref 0.1–1.0)
Monocytes Relative: 11 % (ref 3–12)
Neutro Abs: 8.1 10*3/uL — ABNORMAL HIGH (ref 1.7–7.7)
Neutrophils Relative %: 82 % — ABNORMAL HIGH (ref 43–77)
Platelets: 183 10*3/uL (ref 150–400)
RBC: 3.77 MIL/uL — ABNORMAL LOW (ref 4.22–5.81)
RDW: 17.3 % — ABNORMAL HIGH (ref 11.5–15.5)
WBC: 9.9 10*3/uL (ref 4.0–10.5)

## 2014-11-18 LAB — COMPREHENSIVE METABOLIC PANEL
ALK PHOS: 169 U/L — AB (ref 39–117)
ALT: 18 U/L (ref 0–53)
AST: 31 U/L (ref 0–37)
Albumin: 3.9 g/dL (ref 3.5–5.2)
Anion gap: 17 — ABNORMAL HIGH (ref 5–15)
BILIRUBIN TOTAL: 0.4 mg/dL (ref 0.3–1.2)
BUN: 44 mg/dL — ABNORMAL HIGH (ref 6–23)
CALCIUM: 11.1 mg/dL — AB (ref 8.4–10.5)
CHLORIDE: 93 meq/L — AB (ref 96–112)
CO2: 23 meq/L (ref 19–32)
Creatinine, Ser: 1.15 mg/dL (ref 0.50–1.35)
GFR, EST AFRICAN AMERICAN: 76 mL/min — AB (ref >90)
GFR, EST NON AFRICAN AMERICAN: 66 mL/min — AB (ref >90)
GLUCOSE: 94 mg/dL (ref 70–99)
POTASSIUM: 3.8 meq/L (ref 3.7–5.3)
Sodium: 133 mEq/L — ABNORMAL LOW (ref 137–147)
Total Protein: 9.1 g/dL — ABNORMAL HIGH (ref 6.0–8.3)

## 2014-11-18 LAB — I-STAT CG4 LACTIC ACID, ED: Lactic Acid, Venous: 2.7 mmol/L — ABNORMAL HIGH (ref 0.5–2.2)

## 2014-11-18 MED ORDER — HYDROMORPHONE HCL 1 MG/ML IJ SOLN
1.0000 mg | Freq: Once | INTRAMUSCULAR | Status: AC
  Administered 2014-11-18: 1 mg via INTRAVENOUS
  Filled 2014-11-18: qty 1

## 2014-11-18 MED ORDER — SODIUM CHLORIDE 0.9 % IV BOLUS (SEPSIS)
1000.0000 mL | Freq: Once | INTRAVENOUS | Status: AC
  Administered 2014-11-18: 1000 mL via INTRAVENOUS

## 2014-11-18 MED ORDER — HYDROMORPHONE HCL 2 MG PO TABS: 2.0000 mg | ORAL_TABLET | Freq: Four times a day (QID) | ORAL | Status: DC | PRN

## 2014-11-18 NOTE — Discharge Instructions (Signed)
As discussed, it is important that you follow up as soon as possible with your physician for continued management of your condition. ° °If you develop any new, or concerning changes in your condition, please return to the emergency department immediately. ° °

## 2014-11-18 NOTE — ED Notes (Signed)
Patient taken to radiology via stretcher Patient in NAD
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
## 2014-11-19 LAB — CALCIUM, IONIZED: Calcium, Ion: 1.34 mmol/L — ABNORMAL HIGH (ref 1.13–1.30)

## 2014-11-22 ENCOUNTER — Ambulatory Visit: Payer: Medicaid Other

## 2014-11-23 ENCOUNTER — Telehealth: Payer: Self-pay | Admitting: *Deleted

## 2014-11-23 NOTE — Telephone Encounter (Signed)
[place] social worker came to nursing saying she noticed that patient was supposed to be sen in Dec 2015,but in [place] it was 2016 appt, she had spoker with Santiago Glad Dillingham andasked to change this appt , it wasn't changed, I spoke wioth KAren and asked her to get MR. Solomon in asap recon, per Dr. Lisbeth Renshaw needing to start radiation on this patient.,Eugenie Birks will make appt change 11:40 AM

## 2014-11-28 ENCOUNTER — Encounter (HOSPITAL_COMMUNITY): Payer: Self-pay | Admitting: Emergency Medicine

## 2014-11-28 ENCOUNTER — Inpatient Hospital Stay (HOSPITAL_COMMUNITY)
Admission: EM | Admit: 2014-11-28 | Discharge: 2014-12-06 | DRG: 947 | Disposition: A | Discharge status: Hospice | Payer: Medicaid Other | Attending: Internal Medicine | Admitting: Internal Medicine

## 2014-11-28 ENCOUNTER — Emergency Department (HOSPITAL_COMMUNITY): Payer: Medicaid Other

## 2014-11-28 DIAGNOSIS — W19XXXA Unspecified fall, initial encounter: Secondary | ICD-10-CM

## 2014-11-28 DIAGNOSIS — C7951 Secondary malignant neoplasm of bone: Secondary | ICD-10-CM | POA: Diagnosis present

## 2014-11-28 DIAGNOSIS — C787 Secondary malignant neoplasm of liver and intrahepatic bile duct: Secondary | ICD-10-CM | POA: Diagnosis present

## 2014-11-28 DIAGNOSIS — K5900 Constipation, unspecified: Secondary | ICD-10-CM | POA: Diagnosis not present

## 2014-11-28 DIAGNOSIS — Z9221 Personal history of antineoplastic chemotherapy: Secondary | ICD-10-CM

## 2014-11-28 DIAGNOSIS — Z515 Encounter for palliative care: Secondary | ICD-10-CM

## 2014-11-28 DIAGNOSIS — Z9119 Patient's noncompliance with other medical treatment and regimen: Secondary | ICD-10-CM | POA: Diagnosis present

## 2014-11-28 DIAGNOSIS — Z66 Do not resuscitate: Secondary | ICD-10-CM | POA: Diagnosis not present

## 2014-11-28 DIAGNOSIS — Z886 Allergy status to analgesic agent status: Secondary | ICD-10-CM

## 2014-11-28 DIAGNOSIS — E871 Hypo-osmolality and hyponatremia: Secondary | ICD-10-CM | POA: Diagnosis present

## 2014-11-28 DIAGNOSIS — C7931 Secondary malignant neoplasm of brain: Secondary | ICD-10-CM | POA: Diagnosis present

## 2014-11-28 DIAGNOSIS — C7971 Secondary malignant neoplasm of right adrenal gland: Secondary | ICD-10-CM | POA: Diagnosis present

## 2014-11-28 DIAGNOSIS — E43 Unspecified severe protein-calorie malnutrition: Secondary | ICD-10-CM | POA: Diagnosis present

## 2014-11-28 DIAGNOSIS — M5440 Lumbago with sciatica, unspecified side: Secondary | ICD-10-CM | POA: Diagnosis present

## 2014-11-28 DIAGNOSIS — Z681 Body mass index (BMI) 19 or less, adult: Secondary | ICD-10-CM

## 2014-11-28 DIAGNOSIS — F17210 Nicotine dependence, cigarettes, uncomplicated: Secondary | ICD-10-CM | POA: Diagnosis present

## 2014-11-28 DIAGNOSIS — G893 Neoplasm related pain (acute) (chronic): Principal | ICD-10-CM | POA: Diagnosis present

## 2014-11-28 DIAGNOSIS — C7952 Secondary malignant neoplasm of bone marrow: Secondary | ICD-10-CM | POA: Diagnosis present

## 2014-11-28 DIAGNOSIS — C3490 Malignant neoplasm of unspecified part of unspecified bronchus or lung: Secondary | ICD-10-CM | POA: Diagnosis present

## 2014-11-28 DIAGNOSIS — D649 Anemia, unspecified: Secondary | ICD-10-CM | POA: Diagnosis present

## 2014-11-28 DIAGNOSIS — M8448XA Pathological fracture, other site, initial encounter for fracture: Secondary | ICD-10-CM | POA: Diagnosis present

## 2014-11-28 DIAGNOSIS — M545 Low back pain, unspecified: Secondary | ICD-10-CM | POA: Diagnosis present

## 2014-11-28 DIAGNOSIS — N1330 Unspecified hydronephrosis: Secondary | ICD-10-CM | POA: Diagnosis present

## 2014-11-28 DIAGNOSIS — M549 Dorsalgia, unspecified: Secondary | ICD-10-CM | POA: Diagnosis present

## 2014-11-28 DIAGNOSIS — Z923 Personal history of irradiation: Secondary | ICD-10-CM

## 2014-11-28 DIAGNOSIS — C799 Secondary malignant neoplasm of unspecified site: Secondary | ICD-10-CM

## 2014-11-28 HISTORY — DX: Hypercalcemia: E83.52

## 2014-11-28 LAB — URINALYSIS, ROUTINE W REFLEX MICROSCOPIC
Glucose, UA: NEGATIVE mg/dL
Hgb urine dipstick: NEGATIVE
Ketones, ur: NEGATIVE mg/dL
LEUKOCYTES UA: NEGATIVE
NITRITE: NEGATIVE
PH: 5.5 (ref 5.0–8.0)
Protein, ur: NEGATIVE mg/dL
SPECIFIC GRAVITY, URINE: 1.028 (ref 1.005–1.030)
Urobilinogen, UA: 1 mg/dL (ref 0.0–1.0)

## 2014-11-28 LAB — COMPREHENSIVE METABOLIC PANEL
ALBUMIN: 3.3 g/dL — AB (ref 3.5–5.2)
ALT: 32 U/L (ref 0–53)
ANION GAP: 15 (ref 5–15)
AST: 39 U/L — ABNORMAL HIGH (ref 0–37)
Alkaline Phosphatase: 216 U/L — ABNORMAL HIGH (ref 39–117)
BUN: 29 mg/dL — AB (ref 6–23)
CO2: 26 mEq/L (ref 19–32)
CREATININE: 0.99 mg/dL (ref 0.50–1.35)
Calcium: 10.9 mg/dL — ABNORMAL HIGH (ref 8.4–10.5)
Chloride: 95 mEq/L — ABNORMAL LOW (ref 96–112)
GFR calc Af Amer: >90 mL/min (ref >90)
GFR calc non Af Amer: 85 mL/min — ABNORMAL LOW (ref >90)
Glucose, Bld: 86 mg/dL (ref 70–99)
Potassium: 4.7 mEq/L (ref 3.7–5.3)
Sodium: 136 mEq/L — ABNORMAL LOW (ref 137–147)
Total Bilirubin: 0.6 mg/dL (ref 0.3–1.2)
Total Protein: 8.4 g/dL — ABNORMAL HIGH (ref 6.0–8.3)

## 2014-11-28 LAB — I-STAT CG4 LACTIC ACID, ED: Lactic Acid, Venous: 2.24 mmol/L — ABNORMAL HIGH (ref 0.5–2.2)

## 2014-11-28 LAB — CBC WITH DIFFERENTIAL/PLATELET
BASOS PCT: 0 % (ref 0–1)
Basophils Absolute: 0 10*3/uL (ref 0.0–0.1)
EOS ABS: 0.1 10*3/uL (ref 0.0–0.7)
Eosinophils Relative: 1 % (ref 0–5)
HEMATOCRIT: 33.2 % — AB (ref 39.0–52.0)
Hemoglobin: 10.3 g/dL — ABNORMAL LOW (ref 13.0–17.0)
Lymphocytes Relative: 13 % (ref 12–46)
Lymphs Abs: 1.3 10*3/uL (ref 0.7–4.0)
MCH: 26.8 pg (ref 26.0–34.0)
MCHC: 31 g/dL (ref 30.0–36.0)
MCV: 86.2 fL (ref 78.0–100.0)
MONOS PCT: 10 % (ref 3–12)
Monocytes Absolute: 0.9 10*3/uL (ref 0.1–1.0)
NEUTROS PCT: 76 % (ref 43–77)
Neutro Abs: 7.7 10*3/uL (ref 1.7–7.7)
Platelets: 169 10*3/uL (ref 150–400)
RBC: 3.85 MIL/uL — ABNORMAL LOW (ref 4.22–5.81)
RDW: 17.4 % — AB (ref 11.5–15.5)
WBC: 9.9 10*3/uL (ref 4.0–10.5)

## 2014-11-28 MED ORDER — HYDROMORPHONE HCL 1 MG/ML IJ SOLN
0.5000 mg | Freq: Once | INTRAMUSCULAR | Status: AC
  Administered 2014-11-28: 0.5 mg via INTRAVENOUS
  Filled 2014-11-28: qty 1

## 2014-11-28 MED ORDER — VANCOMYCIN HCL IN DEXTROSE 1-5 GM/200ML-% IV SOLN
1000.0000 mg | Freq: Once | INTRAVENOUS | Status: AC
  Administered 2014-11-28: 1000 mg via INTRAVENOUS
  Filled 2014-11-28: qty 200

## 2014-11-28 MED ORDER — PIPERACILLIN-TAZOBACTAM 3.375 G IVPB 30 MIN
3.3750 g | Freq: Once | INTRAVENOUS | Status: AC
  Administered 2014-11-28: 3.375 g via INTRAVENOUS
  Filled 2014-11-28: qty 50

## 2014-11-28 MED ORDER — SODIUM CHLORIDE 0.9 % IV BOLUS (SEPSIS)
1000.0000 mL | Freq: Once | INTRAVENOUS | Status: AC
  Administered 2014-11-28: 1000 mL via INTRAVENOUS

## 2014-11-28 NOTE — ED Notes (Signed)
Per EMS-c/o lower back pain radiating to groin x2 days. Rates pain 10/10. Reports trouble walking. Walked to Biomedical [facility] with assistance. Normally is SOB with exertion d/t lung CA. Treated with chemotherapy currently for lung CA.VS: BP 98/72 HR 96 RR 16 Tympanic temp 96.7 F. Unable to obtain pulse ox.

## 2014-11-28 NOTE — ED Provider Notes (Signed)
CSN: [id number]     Arrival date & time 11/28/14  1831 History   First MD Initiated Contact with Patient 11/28/14 1847     Chief Complaint  Patient presents with  . Back Pain  . Flank Pain  . Groin Pain  . Cancer     (Consider location/radiation/quality/duration/timing/severity/associated sxs/prior Treatment) The history is provided by the patient. No language interpreter was used.  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 Patient reported that the other day he fell in the bathtub - stated that he landed on his buttocks, does not believe that he hit his head. Patient reported that he has been taking Dilaudid with minimal relief. Patient reported that he has been feeling warm. Reported cannot recall when his last chemo was - stated that he is due for radiation next week. Patient denied blurred vision, sudden loss of vision, dizziness, headache, melena, nausea, vomiting, diarrhea, hematochezia, urinary and bowel incontinence, loss of sensation, numbness, tingling, chills, neck pain, neck stiffness. PCP none Oncology Dr. Marin Olp  Past Medical History  Diagnosis Date  . Cancer     lung ca dx'd 04/2013  . Cancer 11/30/13 ct    rt para renal mass metastatic  . History of radiation therapy 01/07/14-01/26/14    abdomen/30 Gy/20f  . Hx of radiation therapy 04/05/14-04/09/14    palliative left femur, 20Gy   History reviewed. No pertinent past surgical history. Family History  Problem Relation Age of Onset  . Aneurysm Mother   . COPD Father   . CAD Brother    History  Substance Use Topics  . Smoking status: Current Every Day Smoker  -- 2.00 packs/day for 15 years    Types: Cigarettes  . Smokeless tobacco: Never Used  . Alcohol Use: No    Review of Systems  Constitutional: Negative for fever and chills.  Eyes: Negative for visual disturbance.  Respiratory: Negative for chest tightness and shortness of breath.   Cardiovascular: Negative for chest pain.  Gastrointestinal: Positive for abdominal pain. Negative for nausea, vomiting, diarrhea, constipation, blood in stool and anal bleeding.  Genitourinary: Negative for dysuria and hematuria.  Musculoskeletal: Positive for back pain. Negative for neck pain.  Neurological: Negative for dizziness, weakness and headaches.      Allergies  Tylenol  Home Medications   Prior to Admission medications   Medication Sig Start Date End Date Taking? Authorizing Provider  docusate sodium (COLACE) 100 MG capsule Take 1 capsule (100 mg total) by mouth 2 (two) times daily. 10/15/14  Yes OKalman Drape MD  HYDROmorphone (DILAUDID) 2 MG tablet Take 1 tablet (2 mg total) by mouth every 6 (six) hours as needed for severe pain (DO NOT TAKE OXYCODONE IF TAKING DIALUDID TABLETS. WILL MAKE DROWSY. DO NOT DRINK OR DRIVE WHEN TAKING THIS MED.). 11/18/14  Yes RCarmin Muskrat MD  ibuprofen (ADVIL,MOTRIN) 200 MG tablet Take 400 mg by mouth every 6 (six) hours as needed for moderate pain (pain).   Yes Historical Provider, MD  polyethylene glycol (MIRALAX / GLYCOLAX) packet Take 17 g by mouth daily. 10/15/14  Yes OKalman Drape MD  ondansetron (  ZOFRAN-ODT) 8 MG disintegrating tablet Take 8 mg by mouth every 8 (eight) hours as needed for nausea or vomiting.    Historical Provider, MD  promethazine (PHENERGAN) 25 MG tablet Take 1 tablet (25 mg total) by mouth every 6 (six) hours as needed for nausea or vomiting. Patient not taking: Reported on 11/28/2014 10/11/14   Burnetta Sabin E Johnson, PA-C   BP 114/63 mmHg  Pulse 87  Temp(Src) 99.2 F (37.3 C) (Rectal)  Resp 19  Ht 5' 11"  (1.803 m)  Wt 140 lb  (63.504 kg)  BMI 19.53 kg/m2  SpO2 96% Physical Exam  Constitutional: He is oriented to person, place, and time. He appears well-developed. No distress.  Chronically ill appearing male  HENT:  Head: Normocephalic and atraumatic.  Mouth/Throat: Oropharynx is clear and moist. No oropharyngeal exudate.  Eyes: Conjunctivae and EOM are normal. Pupils are equal, round, and reactive to light. Right eye exhibits no discharge. Left eye exhibits no discharge.  Neck: Normal range of motion. Neck supple. No tracheal deviation present.  Negative neck stiffness Negative nuchal rigidity  Negative cervical lymphadenopathy  Negative meningeal signs Negative pain upon palpation to the c-spine  Cardiovascular: Normal rate, regular rhythm and normal heart sounds.  Exam reveals no friction rub.   No murmur heard. Pulses:      Radial pulses are 2+ on the right side, and 2+ on the left side.       Dorsalis pedis pulses are 2+ on the right side, and 2+ on the left side.  Pulmonary/Chest: Effort normal and breath sounds normal. No respiratory distress. He has no wheezes. He has no rales.  Abdominal: Soft. Bowel sounds are normal. He exhibits no distension. There is tenderness. There is no rebound and no guarding.  Negative abdominal distention Bowel sounds normoactive in all 4 quadrants Abdomen soft upon palpation Diffuse tenderness upon palpation to the abdomen  Musculoskeletal: Normal range of motion.       Thoracic back: He exhibits tenderness and bony tenderness. He exhibits normal range of motion, no swelling, no edema, no deformity and no laceration.       Lumbar back: He exhibits tenderness and bony tenderness. He exhibits normal range of motion, no swelling, no edema and no deformity.       Back:  Negative deformities identified to the spine. Discomfort upon palpation to the mid thoracic and mid lumbosacral sacral spine.  Full ROM to upper and lower extremities without difficulty noted, negative  ataxia noted.  Lymphadenopathy:    He has no cervical adenopathy.  Neurological: He is alert and oriented to person, place, and time. No cranial nerve deficit. He exhibits normal muscle tone. Coordination normal.  Cranial nerves III-XII grossly intact Strength 5+/5+ to upper and lower extremities bilaterally with resistance applied, equal distribution noted Equal grip strength bilaterally Negative facial droop Negative slurred speech Negative aphasia Negative saddle paresthesias bilaterally Sensation intact with differentiation sharp and dull touch Patient response to questions appropriately Patient follows commands well Negative arm drift Fine motor skills intact  Skin: Skin is warm and dry. No rash noted. He is not diaphoretic. No erythema.  Psychiatric: He has a normal mood and affect. His behavior is normal. Thought content normal.  Nursing note and vitals reviewed.   ED Course  Procedures (including critical care time) Labs Review Labs Reviewed  CBC WITH DIFFERENTIAL - Abnormal; Notable for the following:    RBC 3.85 (*)    Hemoglobin 10.3 (*)    HCT 33.2 (*)  RDW 17.4 (*)    All other components within normal limits  COMPREHENSIVE METABOLIC PANEL - Abnormal; Notable for the following:    Sodium 136 (*)    Chloride 95 (*)    BUN 29 (*)    Calcium 10.9 (*)    Total Protein 8.4 (*)    Albumin 3.3 (*)    AST 39 (*)    Alkaline Phosphatase 216 (*)    GFR calc non Af Amer 85 (*)    All other components within normal limits  URINALYSIS, ROUTINE W REFLEX MICROSCOPIC - Abnormal; Notable for the following:    Color, Urine AMBER (*)    Bilirubin Urine SMALL (*)    All other components within normal limits  I-STAT CG4 LACTIC ACID, ED - Abnormal; Notable for the following:    Lactic Acid, Venous 2.24 (*)    All other components within normal limits  URINE CULTURE  CULTURE, BLOOD (ROUTINE X 2)  CULTURE, BLOOD (ROUTINE X 2)  I-STAT CG4 LACTIC ACID, ED  I-STAT CG4 LACTIC  ACID, ED    Imaging Review Dg Chest 2 View  11/28/2014   CLINICAL DATA:  Upper back pain for 2 days  EXAM: CHEST  2 VIEW  COMPARISON:  10/15/2014  FINDINGS: The cardiac shadow is within normal limits. The lungs are clear bilaterally. Nipple shadows are seen bilaterally. There are changes consistent with prior radiation in the left medial lung apex which are stable from the prior exam. No acute bony abnormality is noted.  IMPRESSION: Chronic changes without acute abnormality.   Electronically Signed   By: Inez Catalina M.D.   On: 11/28/2014 20:47   Dg Thoracic Spine 2 View  11/28/2014   CLINICAL DATA:  Upper back pain, initial encounter  EXAM: THORACIC SPINE - 2 VIEW  COMPARISON:  None.  FINDINGS: There is no evidence of thoracic spine fracture. Alignment is normal. No other significant bone abnormalities are identified.  IMPRESSION: No acute abnormality noted.   Electronically Signed   By: Inez Catalina M.D.   On: 11/28/2014 20:48   Dg Lumbar Spine Complete  11/28/2014   CLINICAL DATA:  Low back pain, initial encounter  EXAM: LUMBAR SPINE - COMPLETE 4+ VIEW  COMPARISON:  11/18/2014, 10/11/2014  FINDINGS: Bilateral L5 pars defects are noted. Mildly anterolisthesis is noted of L5 on S1. No acute compression deformity is seen. The metastatic changes in the L3 vertebral body are not well appreciated on this exam. No soft tissue abnormality is seen. Diffuse aortic calcifications are noted.  IMPRESSION: Chronic changes, no acute abnormality is noted.   Electronically Signed   By: Inez Catalina M.D.   On: 11/28/2014 20:51   Ct Head Wo Contrast  11/28/2014   CLINICAL DATA:  Recent fall.  No loss of consciousness.  EXAM: CT HEAD WITHOUT CONTRAST  CT CERVICAL SPINE WITHOUT CONTRAST  TECHNIQUE: Multidetector CT imaging of the head and cervical spine was performed following the standard protocol without intravenous contrast. Multiplanar CT image reconstructions of the cervical spine were also generated.   COMPARISON:  CT scan of head of September 11, 2013. MRI of September 01, 2014.  FINDINGS: CT HEAD FINDINGS  Bony calvarium appears intact. Moderate chronic ischemic white matter disease is noted. No mass effect or midline shift is noted. Ventricular size is within normal limits. There is no evidence of mass lesion, hemorrhage or acute infarction.  CT CERVICAL SPINE FINDINGS  No fracture or spondylolisthesis is noted. Mild anterior osteophyte formation is noted  at C4-5, C5-6 and C6-7. Posterior facet joints appear normal.  IMPRESSION: Moderate diffuse cortical atrophy. No gross intracranial abnormality seen.  Mild degenerative changes as described above. No acute abnormality seen in the cervical spine.   Electronically Signed   By: Sabino Dick M.D.   On: 11/28/2014 20:34   Ct Cervical Spine Wo Contrast  11/28/2014   CLINICAL DATA:  Recent fall.  No loss of consciousness.  EXAM: CT HEAD WITHOUT CONTRAST  CT CERVICAL SPINE WITHOUT CONTRAST  TECHNIQUE: Multidetector CT imaging of the head and cervical spine was performed following the standard protocol without intravenous contrast. Multiplanar CT image reconstructions of the cervical spine were also generated.  COMPARISON:  CT scan of head of September 11, 2013. MRI of September 01, 2014.  FINDINGS: CT HEAD FINDINGS  Bony calvarium appears intact. Moderate chronic ischemic white matter disease is noted. No mass effect or midline shift is noted. Ventricular size is within normal limits. There is no evidence of mass lesion, hemorrhage or acute infarction.  CT CERVICAL SPINE FINDINGS  No fracture or spondylolisthesis is noted. Mild anterior osteophyte formation is noted at C4-5, C5-6 and C6-7. Posterior facet joints appear normal.  IMPRESSION: Moderate diffuse cortical atrophy. No gross intracranial abnormality seen.  Mild degenerative changes as described above. No acute abnormality seen in the cervical spine.   Electronically Signed   By: Sabino Dick M.D.   On:  11/28/2014 20:34   Ct Abdomen Pelvis W Contrast  11/29/2014   CLINICAL DATA:  Current history of metastatic lung cancer. Low back pain.  EXAM: CT ABDOMEN AND PELVIS WITH CONTRAST  TECHNIQUE: Multidetector CT imaging of the abdomen and pelvis was performed using the standard protocol following bolus administration of intravenous contrast.  CONTRAST:  149m OMNIPAQUE IOHEXOL 300 MG/ML  SOLN  COMPARISON:  CT scan of October 11, 2014.  FINDINGS: Increased lucency is noted throughout the L3 vertebral body with disruption of the posterior cortex consistent with metastatic disease. Increased right iliac metastatic lesion is noted. Bilateral pars defects are seen at L5. Visualized lung bases appear normal.  No gallstones are noted. Diffuse rounded low densities are noted throughout the hepatic parenchyma which are significantly increased compared to prior exam consistent with metastatic disease. The spleen and pancreas appear normal. Left adrenal gland and kidney appear normal. Right adrenal mass has significantly enlarged, now measuring 3.1 x 2.6 cm. Severe right hydronephrosis is noted due to mass involving and obstructing the proximal right ureter. This mass measures 2.9 x 2.2 cm. It extends along the proximal portion of the right ureter. 3.3 x 3.1 cm mass arises posteriorly from the upper pole of right kidney which is increased compared to prior exam. The appendix appears normal. Stool is noted in the right colon. No abnormal fluid collection is noted. Atherosclerotic calcifications of abdominal aorta are noted without aneurysm formation. Urinary bladder appears normal. There is no evidence of bowel obstruction. No abnormal fluid collection is noted. Urinary bladder appears normal. No significant adenopathy is noted.  IMPRESSION: Overall, there is significant worsening of metastatic disease seen throughout the abdomen and pelvis. Diffuse hepatic metastases are noted which are increased in number in size compared to  prior exam.  Right adrenal and renal masses are also significantly increased compared to prior exam.  Increased right retroperitoneal mass is noted which surrounds the proximal right ureter and results in severe right hydronephrosis.  Worsening metastatic lesions are noted in the right iliac bone and L3 vertebral body.   Electronically  Signed   By: Sabino Dick M.D.   On: 11/29/2014 00:40     EKG Interpretation None       10:51 PM This provider discussed case in great detail with Dr. Marin Olp, on-call Oncologist. Discussed case, labs, imaging, vitals, ED course in great detail. Agreed with antibiotics secondary to feeling warm and low grade fever. Agreed to plan of admission. Oncology to follow.  12:57 AM This provider spoke with Dr. Maudie Mercury, [facility]. Discussed case, labs, imaging, vitals, ED course in great detail. Patient to be admitted to [facility] for IV hydration. Oncology to follow.  1:19 AM This provider spoke with Dr. Marin Olp, oncologist. Discussed CT abdomen and pelvis imaging in great detail regarding worsening of symptoms. Oncology to assess patient.    MDM   Final diagnoses:  Fall    Medications  sodium chloride 0.9 % bolus 1,000 mL (0 mLs Intravenous Stopped 11/28/14 2203)  HYDROmorphone (DILAUDID) injection 0.5 mg (0.5 mg Intravenous Given 11/28/14 2127)  piperacillin-tazobactam (ZOSYN) IVPB 3.375 g (0 g Intravenous Stopped 11/28/14 2238)  vancomycin (VANCOCIN) IVPB 1000 mg/200 mL premix (1,000 mg Intravenous New Bag/Given 11/28/14 2243)  iohexol (OMNIPAQUE) 300 MG/ML solution 100 mL (100 mLs Intravenous Contrast Given 11/29/14 0012)   Filed Vitals:   11/28/14 2127 11/28/14 2203 11/28/14 2204 11/28/14 2243  BP:  103/77  114/63  Pulse:    87  Temp:      TempSrc:      Resp:  21  19  Height: 5' 11"  (1.803 m)     Weight: 140 lb (63.504 kg)     SpO2:   98% 96%   This provider reviewed the patient's chart. Patient was seen and assessed in ED setting on  11/18/2014 regarding a fall and back pain. Patient was given Dilaudid while in the ED setting. Patient takes Dilaudid at home for cancer pain. Patient has history of lung carcinoma with metastasis to the kdineys.  CBC unremarkable-negative elevated leukocytosis. CMP noted mild hyponatremia of 136, low chloride of 95. Mildly elevated BUN of 29. Elevated calcium of 10.9. Elevated AST of 39 and alk phos of 216.  Lactic acid elevated at 2.24. Chest x-ray negative for acute cardiac pulmonary disease. Thoracic plain film negative for acute osseous injury. Lumbar plain film chronic changes identified no acute abnormalities. CT head noted moderate diffuse cortical atrophy-no gross intracranial abnormalities identified. CT cervical spine mild degenerative changes as described, no acute abnormalities seen on the cervical spine. CT abdomen and pelvis with contrast noted significant worsening of metastatic disease to the abdomen and pelvis-diffuse hepatic metastasis which are increased in number and size compared to prior exam. Right adrenal and renal masses are also significantly increased compared to prior exam. Increased right retroperitoneal masses noted which surrounds the proximal right ureter results in severe right hydronephrosis. Blood culture and urine cultures pending. Patient started on IV antibiotics while in the ED setting. Negative signs of UTI or pyelonephritis. Negative signs of pneumonia. Imaging unremarkable for acute trauma. Discussed case with on-call physician for oncology - agreed to admission under the care of [facility]. Discussed plan of admission with patient who agrees to plan of care. Oncology to follow.   Jamse Mead, PA-C 11/29/14 5009  Artis Delay, MD 12/01/14 [phone]

## 2014-11-28 NOTE — ED Notes (Signed)
urinal given

## 2014-11-28 NOTE — ED Notes (Signed)
Bed: WA08 Expected date:  Expected time:  Means of arrival:  Comments: EMS 

## 2014-11-28 NOTE — ED Notes (Signed)
Patient transported to CT 

## 2014-11-29 ENCOUNTER — Encounter (HOSPITAL_COMMUNITY): Payer: Self-pay

## 2014-11-29 DIAGNOSIS — C7952 Secondary malignant neoplasm of bone marrow: Secondary | ICD-10-CM | POA: Diagnosis present

## 2014-11-29 DIAGNOSIS — K5900 Constipation, unspecified: Secondary | ICD-10-CM | POA: Diagnosis not present

## 2014-11-29 DIAGNOSIS — M549 Dorsalgia, unspecified: Secondary | ICD-10-CM | POA: Diagnosis present

## 2014-11-29 DIAGNOSIS — C3490 Malignant neoplasm of unspecified part of unspecified bronchus or lung: Secondary | ICD-10-CM | POA: Diagnosis present

## 2014-11-29 DIAGNOSIS — E43 Unspecified severe protein-calorie malnutrition: Secondary | ICD-10-CM | POA: Diagnosis present

## 2014-11-29 DIAGNOSIS — Z886 Allergy status to analgesic agent status: Secondary | ICD-10-CM | POA: Diagnosis not present

## 2014-11-29 DIAGNOSIS — M8448XA Pathological fracture, other site, initial encounter for fracture: Secondary | ICD-10-CM | POA: Diagnosis present

## 2014-11-29 DIAGNOSIS — D649 Anemia, unspecified: Secondary | ICD-10-CM | POA: Diagnosis present

## 2014-11-29 DIAGNOSIS — C7971 Secondary malignant neoplasm of right adrenal gland: Secondary | ICD-10-CM | POA: Diagnosis present

## 2014-11-29 DIAGNOSIS — M5440 Lumbago with sciatica, unspecified side: Secondary | ICD-10-CM

## 2014-11-29 DIAGNOSIS — E871 Hypo-osmolality and hyponatremia: Secondary | ICD-10-CM | POA: Diagnosis present

## 2014-11-29 DIAGNOSIS — Z923 Personal history of irradiation: Secondary | ICD-10-CM | POA: Diagnosis not present

## 2014-11-29 DIAGNOSIS — M5441 Lumbago with sciatica, right side: Secondary | ICD-10-CM

## 2014-11-29 DIAGNOSIS — G893 Neoplasm related pain (acute) (chronic): Secondary | ICD-10-CM | POA: Diagnosis present

## 2014-11-29 DIAGNOSIS — Z9119 Patient's noncompliance with other medical treatment and regimen: Secondary | ICD-10-CM | POA: Diagnosis present

## 2014-11-29 DIAGNOSIS — C799 Secondary malignant neoplasm of unspecified site: Secondary | ICD-10-CM

## 2014-11-29 DIAGNOSIS — C7951 Secondary malignant neoplasm of bone: Secondary | ICD-10-CM | POA: Diagnosis present

## 2014-11-29 DIAGNOSIS — Z66 Do not resuscitate: Secondary | ICD-10-CM | POA: Diagnosis present

## 2014-11-29 DIAGNOSIS — C787 Secondary malignant neoplasm of liver and intrahepatic bile duct: Secondary | ICD-10-CM | POA: Diagnosis present

## 2014-11-29 DIAGNOSIS — Z515 Encounter for palliative care: Secondary | ICD-10-CM | POA: Diagnosis not present

## 2014-11-29 DIAGNOSIS — Z681 Body mass index (BMI) 19 or less, adult: Secondary | ICD-10-CM | POA: Diagnosis not present

## 2014-11-29 DIAGNOSIS — N1330 Unspecified hydronephrosis: Secondary | ICD-10-CM | POA: Diagnosis present

## 2014-11-29 DIAGNOSIS — F17210 Nicotine dependence, cigarettes, uncomplicated: Secondary | ICD-10-CM | POA: Diagnosis present

## 2014-11-29 DIAGNOSIS — C7931 Secondary malignant neoplasm of brain: Secondary | ICD-10-CM | POA: Diagnosis present

## 2014-11-29 DIAGNOSIS — Z9221 Personal history of antineoplastic chemotherapy: Secondary | ICD-10-CM | POA: Diagnosis not present

## 2014-11-29 LAB — COMPREHENSIVE METABOLIC PANEL
ALT: 26 U/L (ref 0–53)
AST: 36 U/L (ref 0–37)
Albumin: 3.1 g/dL — ABNORMAL LOW (ref 3.5–5.2)
Alkaline Phosphatase: 209 U/L — ABNORMAL HIGH (ref 39–117)
Anion gap: 9 (ref 5–15)
BUN: 24 mg/dL — AB (ref 6–23)
CALCIUM: 10.3 mg/dL (ref 8.4–10.5)
CO2: 30 mEq/L (ref 19–32)
Chloride: 95 mEq/L — ABNORMAL LOW (ref 96–112)
Creatinine, Ser: 1.05 mg/dL (ref 0.50–1.35)
GFR calc non Af Amer: 74 mL/min — ABNORMAL LOW (ref >90)
GFR, EST AFRICAN AMERICAN: 85 mL/min — AB (ref >90)
GLUCOSE: 106 mg/dL — AB (ref 70–99)
Potassium: 4.6 mEq/L (ref 3.7–5.3)
SODIUM: 134 meq/L — AB (ref 137–147)
TOTAL PROTEIN: 7.6 g/dL (ref 6.0–8.3)
Total Bilirubin: 0.5 mg/dL (ref 0.3–1.2)

## 2014-11-29 LAB — CBC WITH DIFFERENTIAL/PLATELET
Basophils Absolute: 0 10*3/uL (ref 0.0–0.1)
Basophils Relative: 0 % (ref 0–1)
Eosinophils Absolute: 0.1 10*3/uL (ref 0.0–0.7)
Eosinophils Relative: 2 % (ref 0–5)
HCT: 30.5 % — ABNORMAL LOW (ref 39.0–52.0)
Hemoglobin: 9.5 g/dL — ABNORMAL LOW (ref 13.0–17.0)
LYMPHS PCT: 10 % — AB (ref 12–46)
Lymphs Abs: 0.8 10*3/uL (ref 0.7–4.0)
MCH: 26.8 pg (ref 26.0–34.0)
MCHC: 31.1 g/dL (ref 30.0–36.0)
MCV: 85.9 fL (ref 78.0–100.0)
Monocytes Absolute: 0.8 10*3/uL (ref 0.1–1.0)
Monocytes Relative: 10 % (ref 3–12)
NEUTROS ABS: 6.5 10*3/uL (ref 1.7–7.7)
NEUTROS PCT: 78 % — AB (ref 43–77)
Platelets: 148 10*3/uL — ABNORMAL LOW (ref 150–400)
RBC: 3.55 MIL/uL — AB (ref 4.22–5.81)
RDW: 17.3 % — ABNORMAL HIGH (ref 11.5–15.5)
WBC: 8.2 10*3/uL (ref 4.0–10.5)

## 2014-11-29 LAB — PHOSPHORUS: Phosphorus: 3 mg/dL (ref 2.3–4.6)

## 2014-11-29 LAB — MAGNESIUM: MAGNESIUM: 1.5 mg/dL (ref 1.5–2.5)

## 2014-11-29 LAB — I-STAT CG4 LACTIC ACID, ED: Lactic Acid, Venous: 1.84 mmol/L (ref 0.5–2.2)

## 2014-11-29 LAB — TSH: TSH: 3.82 u[IU]/mL (ref 0.350–4.500)

## 2014-11-29 MED ORDER — HYDROMORPHONE HCL 1 MG/ML IJ SOLN
1.0000 mg | INTRAMUSCULAR | Status: DC | PRN
  Administered 2014-11-29 – 2014-12-03 (×19): 1 mg via INTRAVENOUS
  Filled 2014-11-29 (×20): qty 1

## 2014-11-29 MED ORDER — POLYETHYLENE GLYCOL 3350 17 G PO PACK
17.0000 g | PACK | Freq: Every day | ORAL | Status: DC
  Administered 2014-11-29 – 2014-12-06 (×7): 17 g via ORAL
  Filled 2014-11-29 (×8): qty 1

## 2014-11-29 MED ORDER — ONDANSETRON 8 MG PO TBDP
8.0000 mg | ORAL_TABLET | Freq: Three times a day (TID) | ORAL | Status: DC | PRN
  Filled 2014-11-29: qty 1

## 2014-11-29 MED ORDER — HYDROMORPHONE HCL 1 MG/ML IJ SOLN
1.0000 mg | INTRAMUSCULAR | Status: DC | PRN
  Administered 2014-11-29 (×4): 1 mg via INTRAVENOUS
  Filled 2014-11-29 (×4): qty 1

## 2014-11-29 MED ORDER — DOCUSATE SODIUM 100 MG PO CAPS
100.0000 mg | ORAL_CAPSULE | Freq: Two times a day (BID) | ORAL | Status: DC
  Administered 2014-11-29 – 2014-12-03 (×9): 100 mg via ORAL
  Filled 2014-11-29 (×10): qty 1

## 2014-11-29 MED ORDER — ENSURE COMPLETE PO LIQD
237.0000 mL | Freq: Three times a day (TID) | ORAL | Status: DC
  Administered 2014-11-30 – 2014-12-06 (×11): 237 mL via ORAL
  Filled 2014-11-29: qty 237

## 2014-11-29 MED ORDER — VANCOMYCIN HCL IN DEXTROSE 750-5 MG/150ML-% IV SOLN
750.0000 mg | Freq: Two times a day (BID) | INTRAVENOUS | Status: DC
  Administered 2014-11-29 – 2014-12-01 (×5): 750 mg via INTRAVENOUS
  Filled 2014-11-29 (×6): qty 150

## 2014-11-29 MED ORDER — SODIUM CHLORIDE 0.9 % IV SOLN
INTRAVENOUS | Status: AC
  Administered 2014-11-29 (×2): via INTRAVENOUS
  Administered 2014-11-30: 1000 mL via INTRAVENOUS
  Administered 2014-11-30: 22:00:00 via INTRAVENOUS

## 2014-11-29 MED ORDER — PIPERACILLIN-TAZOBACTAM 3.375 G IVPB
3.3750 g | Freq: Three times a day (TID) | INTRAVENOUS | Status: DC
  Administered 2014-11-29 – 2014-12-01 (×8): 3.375 g via INTRAVENOUS
  Filled 2014-11-29 (×9): qty 50

## 2014-11-29 MED ORDER — ENOXAPARIN SODIUM 40 MG/0.4ML ~~LOC~~ SOLN
40.0000 mg | SUBCUTANEOUS | Status: DC
  Administered 2014-11-29 – 2014-12-06 (×8): 40 mg via SUBCUTANEOUS
  Filled 2014-11-29 (×8): qty 0.4

## 2014-11-29 MED ORDER — IOHEXOL 300 MG/ML  SOLN
100.0000 mL | Freq: Once | INTRAMUSCULAR | Status: AC | PRN
  Administered 2014-11-29: 100 mL via INTRAVENOUS

## 2014-11-29 MED ORDER — HYDROMORPHONE HCL 2 MG PO TABS
2.0000 mg | ORAL_TABLET | Freq: Four times a day (QID) | ORAL | Status: DC | PRN
  Administered 2014-11-29 – 2014-12-03 (×9): 2 mg via ORAL
  Filled 2014-11-29 (×12): qty 1

## 2014-11-29 NOTE — Progress Notes (Signed)
ANTIBIOTIC CONSULT NOTE - INITIAL  Pharmacy Consult for Vancomycin and Zosyn  Indication: Sepsis  Allergies  Allergen Reactions  . Tylenol [Acetaminophen]     Was told not to take tylenol    Patient Measurements: Height: 5\' 11"  (180.3 cm) Weight: 140 lb (63.504 kg) IBW/kg (Calculated) : 75.3 Adjusted Body Weight:   Vital Signs: Temp: 98.2 F (36.8 C) (12/14 0503) Temp Source: Oral (12/14 0503) BP: 115/60 mmHg (12/14 0503) Pulse Rate: 80 (12/14 0503) Intake/Output from previous day:   Intake/Output from this shift:    Labs:  Recent Labs  11/28/14 2103  WBC 9.9  HGB 10.3*  PLT 169  CREATININE 0.99   Estimated Creatinine Clearance: 68.6 mL/min (by C-G formula based on Cr of 0.99). No results for input(s): VANCOTROUGH, VANCOPEAK, VANCORANDOM, GENTTROUGH, GENTPEAK, GENTRANDOM, TOBRATROUGH, TOBRAPEAK, TOBRARND, AMIKACINPEAK, AMIKACINTROU, AMIKACIN in the last 72 hours.   Microbiology: No results found for this or any previous visit (from the past 720 hour(s)).  Medical History: Past Medical History  Diagnosis Date  . Cancer     lung ca dx'd 04/2013  . Cancer 11/30/13 ct    rt para renal mass metastatic  . History of radiation therapy 01/07/14-01/26/14    abdomen/30 Gy/16fx  . Hx of radiation therapy 04/05/14-04/09/14    palliative left femur, 20Gy  . Hypercalcemia 11/18/2014    Medications:  Anti-infectives    Start     Dose/Rate Route Frequency Ordered Stop   11/28/14 2130  piperacillin-tazobactam (ZOSYN) IVPB 3.375 g     3.375 g100 mL/hr over 30 Minutes Intravenous  Once 11/28/14 2124 11/28/14 2238   11/28/14 2130  vancomycin (VANCOCIN) IVPB 1000 mg/200 mL premix     1,000 mg200 mL/hr over 60 Minutes Intravenous  Once 11/28/14 2124 11/29/14 0048     Assessment: Patient with sepsis. First dose of antibiotics already given.  Goal of Therapy:  Vancomycin trough level 15-20 mcg/ml  Zosyn based on renal function   Plan:  Measure antibiotic drug levels at  steady state Follow up culture results Vancomycin 750mg  iv q12hr  Zosyn 3.375g IV Q8H infused over 4hrs.   Tyler Deis, Shea Stakes [place] 11/29/2014,5:22 AM

## 2014-11-29 NOTE — Progress Notes (Signed)
Patient admitted to room 1528 without incident.  Alert and oriented x4.  Medicated for pain 10/10 to lower back with Dilaudid 1mg  IV as ordered.  Patient states relief with pain currently 5/10.  Kpad also in use.  No other complaints voiced.  Using urinal without difficulty.  Will monitor for changes in condition.  Jacob Webb M

## 2014-11-29 NOTE — Care Management Note (Signed)
    Page 1 of 1   11/29/2014     1:02:08 PM CARE MANAGEMENT NOTE 11/29/2014  Patient:  Jacob Webb, Jacob Webb   Account Number:  [id number]  Date Initiated:  11/29/2014  Documentation initiated by:  Sunday Spillers  Subjective/Objective Assessment:   63 yo male admitted with back pain, mets lung cancer. PTA lived at home with daughter.     Action/Plan:   Home when stable   Anticipated DC Date:  11/29/2014   Anticipated DC Plan:  [place]  CM consult      Choice offered to / List presented to:             Status of service:  Completed, signed off Medicare Important Message given?   (If response is "NO", the following Medicare IM given date fields will be blank) Date Medicare IM given:   Medicare IM given by:   Date Additional Medicare IM given:   Additional Medicare IM given by:    Discharge Disposition:  HOME/SELF CARE  Per UR Regulation:  Reviewed for med. necessity/level of care/duration of stay  If discussed at [place] of Stay Meetings, dates discussed:    Comments:

## 2014-11-29 NOTE — Progress Notes (Signed)
[facility] PROGRESS NOTE  Jacob Webb HYQ:[id number] DOB: Feb 25, 1951 DOA: 11/28/2014 PCP: No PCP Per Patient  Assessment/Plan: 1. Back pain 1. Known lumbar metastatic lesions 2. Cont analgesics for pain control 3. Oncology consulted for further recs 2. Hypercalcemia 1. Improving 2. Likely related to malignancy 3. Anemia  1. Stable 2. Cont to follow. No signs of active bleed 4. Hyponatremia  1. Appears stable 2. Probably related to malignancy 3. Cont to monitor 5. Hydronephrosis 1. Likely related to retroperitoneal lesions 2. Mod-severe hydronephrosis noted on prior imaging as far back as 8/15 3. Renal function is unremarkable 6. Metastatic SCLC 1. Oncology consulted 2. Cont analgesics for now 7. DVT prophylaxis 1. Lovenox  Code Status: Full Family Communication: Pt in room (indicate person spoken with, relationship, and if by phone, the number) Disposition Plan: Pending   Consultants:  Oncology  Procedures:    Antibiotics:   (indicate start date, and stop date if known)  HPI/Subjective: Complains of continued back pain. No acute events noted overnight.  Objective: Filed Vitals:   11/29/14 0149 11/29/14 0240 11/29/14 0503 11/29/14 1000  BP: 124/72 103/58 115/60 111/67  Pulse: 85 74 80 82  Temp: 98 F (36.7 C) 98.1 F (36.7 C) 98.2 F (36.8 C) 97.9 F (36.6 C)  TempSrc: Oral Oral Oral Oral  Resp: 16 16 18 20   Height:      Weight:      SpO2: 100% 100% 100% 100%    Intake/Output Summary (Last 24 hours) at 11/29/14 1454 Last data filed at 11/29/14 1400  Gross per 24 hour  Intake    835 ml  Output    850 ml  Net    -15 ml   Filed Weights   11/28/14 2127  Weight: 63.504 kg (140 lb)    Exam:   General:  Awake, in nad  Cardiovascular: regular, s1, s2   Respiratory: normal resp effort, no wheezing  Abdomen: soft,nondistneded  Musculoskeletal: perfused, no clubbing, continued low back pain   Data Reviewed: Basic Metabolic  Panel:  Recent Labs Lab 11/28/14 2103 11/29/14 0942  NA 136* 134*  K 4.7 4.6  CL 95* 95*  CO2 26 30  GLUCOSE 86 106*  BUN 29* 24*  CREATININE 0.99 1.05  CALCIUM 10.9* 10.3  MG  --  1.5  PHOS  --  3.0   Liver Function Tests:  Recent Labs Lab 11/28/14 2103 11/29/14 0942  AST 39* 36  ALT 32 26  ALKPHOS 216* 209*  BILITOT 0.6 0.5  PROT 8.4* 7.6  ALBUMIN 3.3* 3.1*   No results for input(s): LIPASE, AMYLASE in the last 168 hours. No results for input(s): AMMONIA in the last 168 hours. CBC:  Recent Labs Lab 11/28/14 2103 11/29/14 0942  WBC 9.9 8.2  NEUTROABS 7.7 6.5  HGB 10.3* 9.5*  HCT 33.2* 30.5*  MCV 86.2 85.9  PLT 169 148*   Cardiac Enzymes: No results for input(s): CKTOTAL, CKMB, CKMBINDEX, TROPONINI in the last 168 hours. BNP (last 3 results) No results for input(s): PROBNP in the last 8760 hours. CBG: No results for input(s): GLUCAP in the last 168 hours.  No results found for this or any previous visit (from the past 240 hour(s)).   Studies: Dg Chest 2 View  11/28/2014   CLINICAL DATA:  Upper back pain for 2 days  EXAM: CHEST  2 VIEW  COMPARISON:  10/15/2014  FINDINGS: The cardiac shadow is within normal limits. The lungs are clear bilaterally. Nipple shadows are seen bilaterally.  There are changes consistent with prior radiation in the left medial lung apex which are stable from the prior exam. No acute bony abnormality is noted.  IMPRESSION: Chronic changes without acute abnormality.   Electronically Signed   By: Inez Catalina M.D.   On: 11/28/2014 20:47   Dg Thoracic Spine 2 View  11/28/2014   CLINICAL DATA:  Upper back pain, initial encounter  EXAM: THORACIC SPINE - 2 VIEW  COMPARISON:  None.  FINDINGS: There is no evidence of thoracic spine fracture. Alignment is normal. No other significant bone abnormalities are identified.  IMPRESSION: No acute abnormality noted.   Electronically Signed   By: Inez Catalina M.D.   On: 11/28/2014 20:48   Dg Lumbar  Spine Complete  11/28/2014   CLINICAL DATA:  Low back pain, initial encounter  EXAM: LUMBAR SPINE - COMPLETE 4+ VIEW  COMPARISON:  11/18/2014, 10/11/2014  FINDINGS: Bilateral L5 pars defects are noted. Mildly anterolisthesis is noted of L5 on S1. No acute compression deformity is seen. The metastatic changes in the L3 vertebral body are not well appreciated on this exam. No soft tissue abnormality is seen. Diffuse aortic calcifications are noted.  IMPRESSION: Chronic changes, no acute abnormality is noted.   Electronically Signed   By: Inez Catalina M.D.   On: 11/28/2014 20:51   Ct Head Wo Contrast  11/28/2014   CLINICAL DATA:  Recent fall.  No loss of consciousness.  EXAM: CT HEAD WITHOUT CONTRAST  CT CERVICAL SPINE WITHOUT CONTRAST  TECHNIQUE: Multidetector CT imaging of the head and cervical spine was performed following the standard protocol without intravenous contrast. Multiplanar CT image reconstructions of the cervical spine were also generated.  COMPARISON:  CT scan of head of September 11, 2013. MRI of September 01, 2014.  FINDINGS: CT HEAD FINDINGS  Bony calvarium appears intact. Moderate chronic ischemic white matter disease is noted. No mass effect or midline shift is noted. Ventricular size is within normal limits. There is no evidence of mass lesion, hemorrhage or acute infarction.  CT CERVICAL SPINE FINDINGS  No fracture or spondylolisthesis is noted. Mild anterior osteophyte formation is noted at C4-5, C5-6 and C6-7. Posterior facet joints appear normal.  IMPRESSION: Moderate diffuse cortical atrophy. No gross intracranial abnormality seen.  Mild degenerative changes as described above. No acute abnormality seen in the cervical spine.   Electronically Signed   By: Sabino Dick M.D.   On: 11/28/2014 20:34   Ct Cervical Spine Wo Contrast  11/28/2014   CLINICAL DATA:  Recent fall.  No loss of consciousness.  EXAM: CT HEAD WITHOUT CONTRAST  CT CERVICAL SPINE WITHOUT CONTRAST  TECHNIQUE:  Multidetector CT imaging of the head and cervical spine was performed following the standard protocol without intravenous contrast. Multiplanar CT image reconstructions of the cervical spine were also generated.  COMPARISON:  CT scan of head of September 11, 2013. MRI of September 01, 2014.  FINDINGS: CT HEAD FINDINGS  Bony calvarium appears intact. Moderate chronic ischemic white matter disease is noted. No mass effect or midline shift is noted. Ventricular size is within normal limits. There is no evidence of mass lesion, hemorrhage or acute infarction.  CT CERVICAL SPINE FINDINGS  No fracture or spondylolisthesis is noted. Mild anterior osteophyte formation is noted at C4-5, C5-6 and C6-7. Posterior facet joints appear normal.  IMPRESSION: Moderate diffuse cortical atrophy. No gross intracranial abnormality seen.  Mild degenerative changes as described above. No acute abnormality seen in the cervical spine.   Electronically Signed  By: Sabino Dick M.D.   On: 11/28/2014 20:34   Ct Abdomen Pelvis W Contrast  11/29/2014   CLINICAL DATA:  Current history of metastatic lung cancer. Low back pain.  EXAM: CT ABDOMEN AND PELVIS WITH CONTRAST  TECHNIQUE: Multidetector CT imaging of the abdomen and pelvis was performed using the standard protocol following bolus administration of intravenous contrast.  CONTRAST:  168mL OMNIPAQUE IOHEXOL 300 MG/ML  SOLN  COMPARISON:  CT scan of October 11, 2014.  FINDINGS: Increased lucency is noted throughout the L3 vertebral body with disruption of the posterior cortex consistent with metastatic disease. Increased right iliac metastatic lesion is noted. Bilateral pars defects are seen at L5. Visualized lung bases appear normal.  No gallstones are noted. Diffuse rounded low densities are noted throughout the hepatic parenchyma which are significantly increased compared to prior exam consistent with metastatic disease. The spleen and pancreas appear normal. Left adrenal gland and  kidney appear normal. Right adrenal mass has significantly enlarged, now measuring 3.1 x 2.6 cm. Severe right hydronephrosis is noted due to mass involving and obstructing the proximal right ureter. This mass measures 2.9 x 2.2 cm. It extends along the proximal portion of the right ureter. 3.3 x 3.1 cm mass arises posteriorly from the upper pole of right kidney which is increased compared to prior exam. The appendix appears normal. Stool is noted in the right colon. No abnormal fluid collection is noted. Atherosclerotic calcifications of abdominal aorta are noted without aneurysm formation. Urinary bladder appears normal. There is no evidence of bowel obstruction. No abnormal fluid collection is noted. Urinary bladder appears normal. No significant adenopathy is noted.  IMPRESSION: Overall, there is significant worsening of metastatic disease seen throughout the abdomen and pelvis. Diffuse hepatic metastases are noted which are increased in number in size compared to prior exam.  Right adrenal and renal masses are also significantly increased compared to prior exam.  Increased right retroperitoneal mass is noted which surrounds the proximal right ureter and results in severe right hydronephrosis.  Worsening metastatic lesions are noted in the right iliac bone and L3 vertebral body.   Electronically Signed   By: Sabino Dick M.D.   On: 11/29/2014 00:40    Scheduled Meds: . docusate sodium  100 mg Oral BID  . enoxaparin (LOVENOX) injection  40 mg Subcutaneous Q24H  . piperacillin-tazobactam (ZOSYN)  IV  3.375 g Intravenous 3 times per day  . polyethylene glycol  17 g Oral Daily  . vancomycin  750 mg Intravenous Q12H   Continuous Infusions: . sodium chloride 150 mL/hr at 11/29/14 1305    Active Problems:   Back pain   Anemia   Hypercalcemia  Time spent: 16min  [facility], [facility] Pager [phone]. If 7PM-7AM, please contact night-coverage at [email or web address], password [facility] 11/29/2014,  2:54 PM  LOS: 1 day

## 2014-11-29 NOTE — H&P (Signed)
Jacob Webb is an 63 y.o. male.    Pcp: unassigned Curt Bears (oncologist)  Chief Complaint: back pain HPI: 64 yo male with hx of small cell lung cancer (metastatic), apparently complains of back pain and therefore presented to Ed.  Per ED NP, pt c/o fever at home, however pt denied this when asked directly.  Pt states that he presented due to increased back pain over the past 2 days,  Radiation of the pain down his right leg with some associated numbness.  Denies fever, chills, incontinence, weakness.   Pt seen in ED and Ct scan showed metastatic disease to L3, and also increase in right adrenal metastatsis with R hydronephrosis, and also diffuse hepatic metastatic disease.  Pt was noted to have some hypercalcemia which appears to have been present on labs on 11/18/2014.  Pt will be admitted for pain control as well as treatment of hypercalcemia, and R hydronephrosis.   Past Medical History  Diagnosis Date  . Cancer     lung ca dx'd 04/2013  . Cancer 11/30/13 ct    rt para renal mass metastatic  . History of radiation therapy 01/07/14-01/26/14    abdomen/30 Gy/49f  . Hx of radiation therapy 04/05/14-04/09/14    palliative left femur, 20Gy  . Hypercalcemia 11/18/2014    Past Surgical History  Procedure Laterality Date  . Lung biopsy  05/05/2013    small cell carcinoma    Family History  Problem Relation Age of Onset  . Aneurysm Mother   . COPD Father   . CAD Brother    Social History:  reports that he has been smoking Cigarettes.  He has a 30 pack-year smoking history. He has never used smokeless tobacco. He reports that he does not drink alcohol or use illicit drugs.  Allergies:  Allergies  Allergen Reactions  . Tylenol [Acetaminophen]     Was told not to take tylenol     (Not in a hospital admission)  Results for orders placed or performed during the hospital encounter of 11/28/14 (from the past 48 hour(s))  CBC with Differential     Status: Abnormal   Collection  Time: 11/28/14  9:03 PM  Result Value Ref Range   WBC 9.9 4.0 - 10.5 K/uL   RBC 3.85 (L) 4.22 - 5.81 MIL/uL   Hemoglobin 10.3 (L) 13.0 - 17.0 g/dL   HCT 33.2 (L) 39.0 - 52.0 %   MCV 86.2 78.0 - 100.0 fL   MCH 26.8 26.0 - 34.0 pg   MCHC 31.0 30.0 - 36.0 g/dL   RDW 17.4 (H) 11.5 - 15.5 %   Platelets 169 150 - 400 K/uL   Neutrophils Relative % 76 43 - 77 %   Neutro Abs 7.7 1.7 - 7.7 K/uL   Lymphocytes Relative 13 12 - 46 %   Lymphs Abs 1.3 0.7 - 4.0 K/uL   Monocytes Relative 10 3 - 12 %   Monocytes Absolute 0.9 0.1 - 1.0 K/uL   Eosinophils Relative 1 0 - 5 %   Eosinophils Absolute 0.1 0.0 - 0.7 K/uL   Basophils Relative 0 0 - 1 %   Basophils Absolute 0.0 0.0 - 0.1 K/uL  Comprehensive metabolic panel     Status: Abnormal   Collection Time: 11/28/14  9:03 PM  Result Value Ref Range   Sodium 136 (L) 137 - 147 mEq/L   Potassium 4.7 3.7 - 5.3 mEq/L   Chloride 95 (L) 96 - 112 mEq/L   CO2 26 19 -  32 mEq/L   Glucose, Bld 86 70 - 99 mg/dL   BUN 29 (H) 6 - 23 mg/dL   Creatinine, Ser 0.99 0.50 - 1.35 mg/dL   Calcium 10.9 (H) 8.4 - 10.5 mg/dL   Total Protein 8.4 (H) 6.0 - 8.3 g/dL   Albumin 3.3 (L) 3.5 - 5.2 g/dL   AST 39 (H) 0 - 37 U/L   ALT 32 0 - 53 U/L   Alkaline Phosphatase 216 (H) 39 - 117 U/L   Total Bilirubin 0.6 0.3 - 1.2 mg/dL   GFR calc non Af Amer 85 (L) >90 mL/min   GFR calc Af Amer >90 >90 mL/min    Comment: (NOTE) The eGFR has been calculated using the CKD EPI equation. This calculation has not been validated in all clinical situations. eGFR's persistently <90 mL/min signify possible Chronic Kidney Disease.    Anion gap 15 5 - 15  I-Stat CG4 Lactic Acid, ED     Status: Abnormal   Collection Time: 11/28/14  9:11 PM  Result Value Ref Range   Lactic Acid, Venous 2.24 (H) 0.5 - 2.2 mmol/L  Urinalysis, Routine w reflex microscopic     Status: Abnormal   Collection Time: 11/28/14  9:27 PM  Result Value Ref Range   Color, Urine AMBER (A) YELLOW    Comment: BIOCHEMICALS  MAY BE AFFECTED BY COLOR   APPearance CLEAR CLEAR   Specific Gravity, Urine 1.028 1.005 - 1.030   pH 5.5 5.0 - 8.0   Glucose, UA NEGATIVE NEGATIVE mg/dL   Hgb urine dipstick NEGATIVE NEGATIVE   Bilirubin Urine SMALL (A) NEGATIVE   Ketones, ur NEGATIVE NEGATIVE mg/dL   Protein, ur NEGATIVE NEGATIVE mg/dL   Urobilinogen, UA 1.0 0.0 - 1.0 mg/dL   Nitrite NEGATIVE NEGATIVE   Leukocytes, UA NEGATIVE NEGATIVE    Comment: MICROSCOPIC NOT DONE ON URINES WITH NEGATIVE PROTEIN, BLOOD, LEUKOCYTES, NITRITE, OR GLUCOSE <1000 mg/dL.  I-Stat CG4 Lactic Acid, ED     Status: None   Collection Time: 11/29/14 12:57 AM  Result Value Ref Range   Lactic Acid, Venous 1.84 0.5 - 2.2 mmol/L   Dg Chest 2 View  11/28/2014   CLINICAL DATA:  Upper back pain for 2 days  EXAM: CHEST  2 VIEW  COMPARISON:  10/15/2014  FINDINGS: The cardiac shadow is within normal limits. The lungs are clear bilaterally. Nipple shadows are seen bilaterally. There are changes consistent with prior radiation in the left medial lung apex which are stable from the prior exam. No acute bony abnormality is noted.  IMPRESSION: Chronic changes without acute abnormality.   Electronically Signed   By: Inez Catalina M.D.   On: 11/28/2014 20:47   Dg Thoracic Spine 2 View  11/28/2014   CLINICAL DATA:  Upper back pain, initial encounter  EXAM: THORACIC SPINE - 2 VIEW  COMPARISON:  None.  FINDINGS: There is no evidence of thoracic spine fracture. Alignment is normal. No other significant bone abnormalities are identified.  IMPRESSION: No acute abnormality noted.   Electronically Signed   By: Inez Catalina M.D.   On: 11/28/2014 20:48   Dg Lumbar Spine Complete  11/28/2014   CLINICAL DATA:  Low back pain, initial encounter  EXAM: LUMBAR SPINE - COMPLETE 4+ VIEW  COMPARISON:  11/18/2014, 10/11/2014  FINDINGS: Bilateral L5 pars defects are noted. Mildly anterolisthesis is noted of L5 on S1. No acute compression deformity is seen. The metastatic changes in the  L3 vertebral body are not well appreciated on  this exam. No soft tissue abnormality is seen. Diffuse aortic calcifications are noted.  IMPRESSION: Chronic changes, no acute abnormality is noted.   Electronically Signed   By: Inez Catalina M.D.   On: 11/28/2014 20:51   Ct Head Wo Contrast  11/28/2014   CLINICAL DATA:  Recent fall.  No loss of consciousness.  EXAM: CT HEAD WITHOUT CONTRAST  CT CERVICAL SPINE WITHOUT CONTRAST  TECHNIQUE: Multidetector CT imaging of the head and cervical spine was performed following the standard protocol without intravenous contrast. Multiplanar CT image reconstructions of the cervical spine were also generated.  COMPARISON:  CT scan of head of September 11, 2013. MRI of September 01, 2014.  FINDINGS: CT HEAD FINDINGS  Bony calvarium appears intact. Moderate chronic ischemic white matter disease is noted. No mass effect or midline shift is noted. Ventricular size is within normal limits. There is no evidence of mass lesion, hemorrhage or acute infarction.  CT CERVICAL SPINE FINDINGS  No fracture or spondylolisthesis is noted. Mild anterior osteophyte formation is noted at C4-5, C5-6 and C6-7. Posterior facet joints appear normal.  IMPRESSION: Moderate diffuse cortical atrophy. No gross intracranial abnormality seen.  Mild degenerative changes as described above. No acute abnormality seen in the cervical spine.   Electronically Signed   By: Sabino Dick M.D.   On: 11/28/2014 20:34   Ct Cervical Spine Wo Contrast  11/28/2014   CLINICAL DATA:  Recent fall.  No loss of consciousness.  EXAM: CT HEAD WITHOUT CONTRAST  CT CERVICAL SPINE WITHOUT CONTRAST  TECHNIQUE: Multidetector CT imaging of the head and cervical spine was performed following the standard protocol without intravenous contrast. Multiplanar CT image reconstructions of the cervical spine were also generated.  COMPARISON:  CT scan of head of September 11, 2013. MRI of September 01, 2014.  FINDINGS: CT HEAD FINDINGS  Bony  calvarium appears intact. Moderate chronic ischemic white matter disease is noted. No mass effect or midline shift is noted. Ventricular size is within normal limits. There is no evidence of mass lesion, hemorrhage or acute infarction.  CT CERVICAL SPINE FINDINGS  No fracture or spondylolisthesis is noted. Mild anterior osteophyte formation is noted at C4-5, C5-6 and C6-7. Posterior facet joints appear normal.  IMPRESSION: Moderate diffuse cortical atrophy. No gross intracranial abnormality seen.  Mild degenerative changes as described above. No acute abnormality seen in the cervical spine.   Electronically Signed   By: Sabino Dick M.D.   On: 11/28/2014 20:34   Ct Abdomen Pelvis W Contrast  11/29/2014   CLINICAL DATA:  Current history of metastatic lung cancer. Low back pain.  EXAM: CT ABDOMEN AND PELVIS WITH CONTRAST  TECHNIQUE: Multidetector CT imaging of the abdomen and pelvis was performed using the standard protocol following bolus administration of intravenous contrast.  CONTRAST:  138m OMNIPAQUE IOHEXOL 300 MG/ML  SOLN  COMPARISON:  CT scan of October 11, 2014.  FINDINGS: Increased lucency is noted throughout the L3 vertebral body with disruption of the posterior cortex consistent with metastatic disease. Increased right iliac metastatic lesion is noted. Bilateral pars defects are seen at L5. Visualized lung bases appear normal.  No gallstones are noted. Diffuse rounded low densities are noted throughout the hepatic parenchyma which are significantly increased compared to prior exam consistent with metastatic disease. The spleen and pancreas appear normal. Left adrenal gland and kidney appear normal. Right adrenal mass has significantly enlarged, now measuring 3.1 x 2.6 cm. Severe right hydronephrosis is noted due to mass involving and obstructing the  proximal right ureter. This mass measures 2.9 x 2.2 cm. It extends along the proximal portion of the right ureter. 3.3 x 3.1 cm mass arises posteriorly  from the upper pole of right kidney which is increased compared to prior exam. The appendix appears normal. Stool is noted in the right colon. No abnormal fluid collection is noted. Atherosclerotic calcifications of abdominal aorta are noted without aneurysm formation. Urinary bladder appears normal. There is no evidence of bowel obstruction. No abnormal fluid collection is noted. Urinary bladder appears normal. No significant adenopathy is noted.  IMPRESSION: Overall, there is significant worsening of metastatic disease seen throughout the abdomen and pelvis. Diffuse hepatic metastases are noted which are increased in number in size compared to prior exam.  Right adrenal and renal masses are also significantly increased compared to prior exam.  Increased right retroperitoneal mass is noted which surrounds the proximal right ureter and results in severe right hydronephrosis.  Worsening metastatic lesions are noted in the right iliac bone and L3 vertebral body.   Electronically Signed   By: Sabino Dick M.D.   On: 11/29/2014 00:40    Review of Systems  Constitutional: Positive for weight loss. Negative for fever, chills, malaise/fatigue and diaphoresis.  HENT: Negative for congestion, ear discharge, ear pain, hearing loss, nosebleeds, sore throat and tinnitus.   Eyes: Negative for blurred vision, double vision, photophobia, pain, discharge and redness.  Respiratory: Negative for cough, hemoptysis, sputum production, shortness of breath, wheezing and stridor.   Cardiovascular: Negative for chest pain, palpitations, orthopnea, claudication, leg swelling and PND.  Gastrointestinal: Negative for heartburn, nausea, vomiting, abdominal pain, diarrhea, constipation, blood in stool and melena.  Genitourinary: Negative for dysuria, urgency, frequency, hematuria and flank pain.  Musculoskeletal: Positive for back pain. Negative for myalgias, joint pain, falls and neck pain.  Skin: Negative for itching and rash.   Neurological: Negative for dizziness, tingling, tremors, sensory change, speech change, focal weakness, seizures, loss of consciousness, weakness and headaches.  Endo/Heme/Allergies: Negative for environmental allergies and polydipsia. Does not bruise/bleed easily.  Psychiatric/Behavioral: Negative for depression, suicidal ideas, hallucinations, memory loss and substance abuse. The patient is not nervous/anxious and does not have insomnia.     Blood pressure 122/73, pulse 88, temperature 98.2 F (36.8 C), temperature source Rectal, resp. rate 16, height _0  (1.803 m), weight 63.504 kg (140 lb), SpO2 100 %. Physical Exam  Constitutional: He is oriented to person, place, and time. He appears well-developed and well-nourished.  HENT:  Head: Normocephalic and atraumatic.  Eyes: Conjunctivae and EOM are normal. Pupils are equal, round, and reactive to light. Right eye exhibits no discharge. Left eye exhibits no discharge.  Neck: Normal range of motion. Neck supple. No JVD present. No tracheal deviation present. No thyromegaly present.  Cardiovascular: Normal rate and regular rhythm.  Exam reveals no gallop and no friction rub.   No murmur heard. Respiratory: Effort normal and breath sounds normal. No respiratory distress. He has no wheezes. He has no rales. He exhibits no tenderness.  GI: Soft. Bowel sounds are normal. He exhibits no distension. There is no tenderness. There is no rebound and no guarding.  Musculoskeletal: Normal range of motion. He exhibits no edema or tenderness.  Lymphadenopathy:    He has no cervical adenopathy.  Neurological: He is alert and oriented to person, place, and time. He has normal reflexes. He displays normal reflexes. No cranial nerve deficit. He exhibits normal muscle tone. Coordination normal.  Skin: Skin is warm and dry. No rash  noted. No erythema. No pallor.  Psychiatric: He has a normal mood and affect. His behavior is normal. Judgment and thought content  normal.     Assessment/Plan Back pain Check MRI L spine  Hypercalcemia Ns iv, likely due to metastatic lung cancer Check magnesium , phos, pth, pth rp, and tsh, and vitamin d  Anemia Check cbc in am  Hyponatremia Hydrate with ns iv  R hydronephrosis Please consider urology consultation in am  Small cell lung cancer (metastatic ) Oncology Dr. Marin Olp aware of patient and oncology will be by to see pt in am Appreciate input.   Jani Gravel 11/29/2014, 1:06 AM

## 2014-11-29 NOTE — Progress Notes (Signed)
INITIAL NUTRITION ASSESSMENT  DOCUMENTATION CODES Per approved criteria  -Not Applicable   INTERVENTION: -Provide Ensure Complete po TID, each supplement provides 350 kcal and 13 grams of protein -Encourage PO intake -RD to continue to monitor  NUTRITION DIAGNOSIS: Increased nutrient (protein) needs related to lung cancer mets as evidenced by poor PO intake <50%.   Goal: Pt to meet >/= 90% of their estimated nutrition needs   Monitor:  PO and supplemental intake, weight, labs, I/O's  Reason for Assessment: Pt identified as at nutrition risk on the Malnutrition Screen Tool  Admitting Dx: Back pain  ASSESSMENT: 63 yo male with hx of small cell lung cancer (metastatic), apparently complains of back pain and therefore presented to Ed.  Pt c/o back pain during RD visit. Pt states RN was notified. Pt reports having eaten a lot of lunch tray, PO intake 30-75%. Pt reports low appetite. Pt presents with weight gain of 9 lb since November.   Patient would like to receive Ensure supplements, RD to order TID.   Unable to perform nutrition focused physical exam as patient complains of pain. Noticeable depletion in temples and arm regions.  Labs reviewed: Low Na Elevated BUN Mg/Phos WNL  Height: Ht Readings from Last 1 Encounters:  11/28/14 5\' 11"  (1.803 m)    Weight: Wt Readings from Last 1 Encounters:  11/28/14 140 lb (63.504 kg)    Ideal Body Weight: 172 lb  % Ideal Body Weight: 81%  Wt Readings from Last 10 Encounters:  11/28/14 140 lb (63.504 kg)  10/20/14 131 lb 3.2 oz (59.512 kg)  09/02/14 130 lb 11.2 oz (59.285 kg)  08/25/14 129 lb 12.8 oz (58.877 kg)  08/03/14 136 lb 14.4 oz (62.097 kg)  07/06/14 140 lb 6.4 oz (63.685 kg)  06/15/14 147 lb 6.4 oz (66.86 kg)  05/20/14 151 lb (68.493 kg)  05/13/14 149 lb 1.6 oz (67.631 kg)  04/09/14 153 lb 1.6 oz (69.446 kg)    Usual Body Weight: 170-175 lb  % Usual Body Weight: 82%  BMI:  Body mass index is 19.53  kg/(m^2).  Estimated Nutritional Needs: Kcal: 1900-2100 Protein: 90-100g Fluid: 1.9L/day  Skin: intact  Diet Order: Diet regular  EDUCATION NEEDS: -Education not appropriate at this time   Intake/Output Summary (Last 24 hours) at 11/29/14 1414 Last data filed at 11/29/14 1135  Gross per 24 hour  Intake    835 ml  Output    700 ml  Net    135 ml    Last BM: 12/13   Labs:   Recent Labs Lab 11/28/14 2103 11/29/14 0942  NA 136* 134*  K 4.7 4.6  CL 95* 95*  CO2 26 30  BUN 29* 24*  CREATININE 0.99 1.05  CALCIUM 10.9* 10.3  MG  --  1.5  PHOS  --  3.0  GLUCOSE 86 106*    CBG (last 3)  No results for input(s): GLUCAP in the last 72 hours.  Scheduled Meds: . docusate sodium  100 mg Oral BID  . enoxaparin (LOVENOX) injection  40 mg Subcutaneous Q24H  . piperacillin-tazobactam (ZOSYN)  IV  3.375 g Intravenous 3 times per day  . polyethylene glycol  17 g Oral Daily  . vancomycin  750 mg Intravenous Q12H    Continuous Infusions: . sodium chloride 150 mL/hr at 11/29/14 1305    Past Medical History  Diagnosis Date  . Cancer     lung ca dx'd 04/2013  . Cancer 11/30/13 ct    rt para  renal mass metastatic  . History of radiation therapy 01/07/14-01/26/14    abdomen/30 Gy/46fx  . Hx of radiation therapy 04/05/14-04/09/14    palliative left femur, 20Gy  . Hypercalcemia 11/18/2014    Past Surgical History  Procedure Laterality Date  . Lung biopsy  05/05/2013    small cell carcinoma    Clayton Bibles, MS, RD, LDN Pager: [phone] After Hours Pager: [phone]

## 2014-11-30 ENCOUNTER — Inpatient Hospital Stay (HOSPITAL_COMMUNITY): Payer: Medicaid Other

## 2014-11-30 DIAGNOSIS — M5489 Other dorsalgia: Secondary | ICD-10-CM

## 2014-11-30 DIAGNOSIS — G894 Chronic pain syndrome: Secondary | ICD-10-CM

## 2014-11-30 LAB — PARATHYROID HORMONE, INTACT (NO CA): PTH: 21 pg/mL (ref 14–64)

## 2014-11-30 LAB — VITAMIN D 25 HYDROXY (VIT D DEFICIENCY, FRACTURES): VIT D 25 HYDROXY: 27 ng/mL — AB (ref 30–100)

## 2014-11-30 LAB — URINE CULTURE
CULTURE: NO GROWTH
Colony Count: NO GROWTH

## 2014-11-30 MED ORDER — DEXAMETHASONE 4 MG PO TABS
8.0000 mg | ORAL_TABLET | Freq: Every day | ORAL | Status: DC
  Administered 2014-11-30 – 2014-12-03 (×4): 8 mg via ORAL
  Filled 2014-11-30 (×4): qty 2

## 2014-11-30 MED ORDER — GADOBENATE DIMEGLUMINE 529 MG/ML IV SOLN
13.0000 mL | Freq: Once | INTRAVENOUS | Status: AC | PRN
Start: 2014-11-30 — End: 2014-11-30
  Administered 2014-11-30: 13 mL via INTRAVENOUS

## 2014-11-30 MED ORDER — BISACODYL 10 MG RE SUPP
10.0000 mg | Freq: Every day | RECTAL | Status: DC | PRN
  Administered 2014-12-01: 10 mg via RECTAL
  Filled 2014-11-30: qty 1

## 2014-11-30 MED ORDER — FENTANYL 25 MCG/HR TD PT72
25.0000 ug | MEDICATED_PATCH | TRANSDERMAL | Status: DC
  Administered 2014-11-30 – 2014-12-06 (×3): 25 ug via TRANSDERMAL
  Filled 2014-11-30 (×3): qty 1

## 2014-11-30 NOTE — Progress Notes (Addendum)
[facility] PROGRESS NOTE  Jacob Webb HAL:[id number] DOB: Oct 12, 1951 DOA: 11/28/2014 PCP: No PCP Per Patient   Off Service Summary: This is an unfortunate 63yo with a hx of metastatic SCLC who presents with intractable back pain, likely secondary to bone mets. Oncology was consulted on admission. The patient continues to complain of marked back pain and fentanyl patch was added as of 12/15. MRI of spine demonstrated mild pathologic compression fracture of L3 with early epidural tumor extension involving the exiting R L3 nerve and mild B S3 neural foraminal tumor. Discussed case with on call Oncologist who recommends starting 8mg  dexamethasone PO qday, first dose today. Ordered. On call Oncologist to update primary Oncologist, Dr. Julien Nordmann.  Assessment/Plan: 1. Back pain 1. Known lumbar metastatic lesions 2. MRI of lumbar region pending 3. Cont analgesics for pain control. Will add 23mcg fentanyl patch 4. Oncology consulted for further recs 2. Hypercalcemia 1. Improved 2. Likely related to malignancy 3. Anemia  1. Stable 2. Cont to follow. No signs of active bleed 4. Hyponatremia  1. Appears stable 2. Suspect related to malignancy 3. Cont to monitor 5. Hydronephrosis 1. Likely related to retroperitoneal lesions 2. Mod-severe hydronephrosis noted on prior imaging as far back as 8/15 3. Renal function had remained unremarkable 4. Cont to monitor 6. Metastatic SCLC 1. Oncology consulted 2. Cont analgesics for now with addition of fentanyl patch per above 7. DVT prophylaxis 1. Lovenox 8. Constipation 1. Already on scheduled colace and miralax with no stool output thus far 2. Will add PRN dulcolax  Code Status: Full Family Communication: Pt in room  Disposition Plan: Pending   Consultants:  Oncology  Procedures:    Antibiotics:   (indicate start date, and stop date if known)  HPI/Subjective: Continues with back pain, poorly controlled with PRN  dilaudid  Objective: Filed Vitals:   11/29/14 1000 11/29/14 1400 11/29/14 2109 11/30/14 0440  BP: 111/67 115/65 132/79 138/81  Pulse: 82 75 75 70  Temp: 97.9 F (36.6 C) 97.7 F (36.5 C) 98 F (36.7 C) 97.9 F (36.6 C)  TempSrc: Oral Oral Oral Oral  Resp: 20 20 18 18   Height:      Weight:      SpO2: 100% 100% 100% 100%    Intake/Output Summary (Last 24 hours) at 11/30/14 1329 Last data filed at 11/30/14 1026  Gross per 24 hour  Intake   3150 ml  Output   2075 ml  Net   1075 ml   Filed Weights   11/28/14 2127  Weight: 63.504 kg (140 lb)    Exam:   General:  Awake, in nad  Cardiovascular: regular, s1, s2   Respiratory: normal resp effort, no wheezing  Abdomen: soft,nondistneded  Musculoskeletal: perfused, no clubbing, continued low back pain   Data Reviewed: Basic Metabolic Panel:  Recent Labs Lab 11/28/14 2103 11/29/14 0942  NA 136* 134*  K 4.7 4.6  CL 95* 95*  CO2 26 30  GLUCOSE 86 106*  BUN 29* 24*  CREATININE 0.99 1.05  CALCIUM 10.9* 10.3  MG  --  1.5  PHOS  --  3.0   Liver Function Tests:  Recent Labs Lab 11/28/14 2103 11/29/14 0942  AST 39* 36  ALT 32 26  ALKPHOS 216* 209*  BILITOT 0.6 0.5  PROT 8.4* 7.6  ALBUMIN 3.3* 3.1*   No results for input(s): LIPASE, AMYLASE in the last 168 hours. No results for input(s): AMMONIA in the last 168 hours. CBC:  Recent Labs Lab 11/28/14 2103  11/29/14 0942  WBC 9.9 8.2  NEUTROABS 7.7 6.5  HGB 10.3* 9.5*  HCT 33.2* 30.5*  MCV 86.2 85.9  PLT 169 148*   Cardiac Enzymes: No results for input(s): CKTOTAL, CKMB, CKMBINDEX, TROPONINI in the last 168 hours. BNP (last 3 results) No results for input(s): PROBNP in the last 8760 hours. CBG: No results for input(s): GLUCAP in the last 168 hours.  Recent Results (from the past 240 hour(s))  Urine culture     Status: None   Collection Time: 11/28/14  9:27 PM  Result Value Ref Range Status   Specimen Description URINE, RANDOM  Final    Special Requests Immunocompromised  Final   Culture  Setup Time   Final    11/29/2014 03:28 Performed at [place] Performed at [facility]   Final   Culture NO GROWTH Performed at [facility]   Final   Report Status 11/30/2014 FINAL  Final  Blood Culture (routine x 2)     Status: None (Preliminary result)   Collection Time: 11/28/14  9:27 PM  Result Value Ref Range Status   Specimen Description BLOOD RIGHT ANTECUBITAL  Final   Special Requests BOTTLES DRAWN AEROBIC AND ANAEROBIC 5CC  Final   Culture  Setup Time   Final    11/29/2014 03:11 Performed at [facility]    Culture   Final           BLOOD CULTURE RECEIVED NO GROWTH TO DATE CULTURE WILL BE HELD FOR 5 DAYS BEFORE ISSUING A FINAL NEGATIVE REPORT Performed at [facility]    Report Status PENDING  Incomplete  Blood Culture (routine x 2)     Status: None (Preliminary result)   Collection Time: 11/28/14 10:06 PM  Result Value Ref Range Status   Specimen Description BLOOD LEFT ANTECUBITAL  Final   Special Requests BOTTLES DRAWN AEROBIC AND ANAEROBIC 5CC  Final   Culture  Setup Time   Final    11/29/2014 03:11 Performed at [facility]    Culture   Final           BLOOD CULTURE RECEIVED NO GROWTH TO DATE CULTURE WILL BE HELD FOR 5 DAYS BEFORE ISSUING A FINAL NEGATIVE REPORT Performed at [facility]    Report Status PENDING  Incomplete     Studies: Dg Chest 2 View  11/28/2014   CLINICAL DATA:  Upper back pain for 2 days  EXAM: CHEST  2 VIEW  COMPARISON:  10/15/2014  FINDINGS: The cardiac shadow is within normal limits. The lungs are clear bilaterally. Nipple shadows are seen bilaterally. There are changes consistent with prior radiation in the left medial lung apex which are stable from the prior exam. No acute bony abnormality is noted.  IMPRESSION: Chronic changes without acute abnormality.   Electronically Signed   By: Inez Catalina M.D.   On: 11/28/2014 20:47   Dg Thoracic Spine 2 View  11/28/2014   CLINICAL DATA:  Upper back pain, initial encounter  EXAM: THORACIC SPINE - 2 VIEW  COMPARISON:  None.  FINDINGS: There is no evidence of thoracic spine fracture. Alignment is normal. No other significant bone abnormalities are identified.  IMPRESSION: No acute abnormality noted.   Electronically Signed   By: Inez Catalina M.D.   On: 11/28/2014 20:48   Dg Lumbar Spine Complete  11/28/2014   CLINICAL DATA:  Low back pain, initial encounter  EXAM: LUMBAR SPINE -  COMPLETE 4+ VIEW  COMPARISON:  11/18/2014, 10/11/2014  FINDINGS: Bilateral L5 pars defects are noted. Mildly anterolisthesis is noted of L5 on S1. No acute compression deformity is seen. The metastatic changes in the L3 vertebral body are not well appreciated on this exam. No soft tissue abnormality is seen. Diffuse aortic calcifications are noted.  IMPRESSION: Chronic changes, no acute abnormality is noted.   Electronically Signed   By: Inez Catalina M.D.   On: 11/28/2014 20:51   Ct Head Wo Contrast  11/28/2014   CLINICAL DATA:  Recent fall.  No loss of consciousness.  EXAM: CT HEAD WITHOUT CONTRAST  CT CERVICAL SPINE WITHOUT CONTRAST  TECHNIQUE: Multidetector CT imaging of the head and cervical spine was performed following the standard protocol without intravenous contrast. Multiplanar CT image reconstructions of the cervical spine were also generated.  COMPARISON:  CT scan of head of September 11, 2013. MRI of September 01, 2014.  FINDINGS: CT HEAD FINDINGS  Bony calvarium appears intact. Moderate chronic ischemic white matter disease is noted. No mass effect or midline shift is noted. Ventricular size is within normal limits. There is no evidence of mass lesion, hemorrhage or acute infarction.  CT CERVICAL SPINE FINDINGS  No fracture or spondylolisthesis is noted. Mild anterior osteophyte formation is noted at C4-5, C5-6 and C6-7. Posterior facet joints appear normal.   IMPRESSION: Moderate diffuse cortical atrophy. No gross intracranial abnormality seen.  Mild degenerative changes as described above. No acute abnormality seen in the cervical spine.   Electronically Signed   By: Sabino Dick M.D.   On: 11/28/2014 20:34   Ct Cervical Spine Wo Contrast  11/28/2014   CLINICAL DATA:  Recent fall.  No loss of consciousness.  EXAM: CT HEAD WITHOUT CONTRAST  CT CERVICAL SPINE WITHOUT CONTRAST  TECHNIQUE: Multidetector CT imaging of the head and cervical spine was performed following the standard protocol without intravenous contrast. Multiplanar CT image reconstructions of the cervical spine were also generated.  COMPARISON:  CT scan of head of September 11, 2013. MRI of September 01, 2014.  FINDINGS: CT HEAD FINDINGS  Bony calvarium appears intact. Moderate chronic ischemic white matter disease is noted. No mass effect or midline shift is noted. Ventricular size is within normal limits. There is no evidence of mass lesion, hemorrhage or acute infarction.  CT CERVICAL SPINE FINDINGS  No fracture or spondylolisthesis is noted. Mild anterior osteophyte formation is noted at C4-5, C5-6 and C6-7. Posterior facet joints appear normal.  IMPRESSION: Moderate diffuse cortical atrophy. No gross intracranial abnormality seen.  Mild degenerative changes as described above. No acute abnormality seen in the cervical spine.   Electronically Signed   By: Sabino Dick M.D.   On: 11/28/2014 20:34   Mr Lumbar Spine W Wo Contrast  11/30/2014   CLINICAL DATA:  63 year old male with metastatic lung cancer. Severe Low back pain with sciatica, laterality on specified. Restaging. Subsequent encounter.  EXAM: MRI LUMBAR SPINE WITHOUT AND WITH CONTRAST  TECHNIQUE: Multiplanar and multiecho pulse sequences of the lumbar spine were obtained without and with intravenous contrast.  CONTRAST:  36mL MULTIHANCE GADOBENATE DIMEGLUMINE 529 MG/ML IV SOLN  COMPARISON:  CT Abdomen and Pelvis 11/29/2014 and earlier. No  prior lumbar MRI.  FINDINGS: Normal lumbar segmentation confirmed on comparison. Widespread bony metastatic disease in the visualized spine. Subtotal tumor replacement of marrowL3 and S2 spinal levels. Perhaps mild loss of L3 vertebral body height. Early epidural tumor extension in the L3 right ventral epidural space and tracking into  the right L3 neural foramen (series 8, images 32 and 33 and series 9, image 7. No other lumbar spine epidural or foraminal extension of tumor. There is mild bilateral as 3 foraminal extension of soft tissue tumor.  Scattered bone metastases up to 2 cm in size elsewhere. No levels are spared T11 through S3. Confluent involvement of the visualized medial iliac bones.  Visualized lower thoracic spinal cord is normal with conus medularis at L1. No abnormal intradural enhancement. Grossly normal cauda equina nerve roots.  Widespread abnormal increased T2 and STIR signal in the erector spinae muscles, no discrete posterior paraspinal muscle metastasis.  Superimposed degenerative L5-S1 spondylolisthesis and chronic spondylolysis.  Numerous liver metastases. Right para per renal metastases re - identified, including bad obstructing the proximal right ureter.  IMPRESSION: 1. Widespread osseous metastatic disease in the visualized spine and pelvis. 2. Mild pathologic compression fracture of L3 with early epidural tumor extension involving the exiting right L3 nerve. Mild bilateral S3 neural foraminal tumor. 3. Widespread liver and right para renal metastases re- identified. 4. No lower spinal cord or cauda equina metastasis identified. 5. 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   Electronically Signed   By: Lars Pinks M.D.   On: 11/30/2014 09:55   Ct Abdomen Pelvis W Contrast  11/29/2014   CLINICAL DATA:  Current history of metastatic lung cancer. Low back pain.  EXAM: CT ABDOMEN AND PELVIS WITH CONTRAST  TECHNIQUE: Multidetector CT imaging of the abdomen and pelvis  was performed using the standard protocol following bolus administration of intravenous contrast.  CONTRAST:  146mL OMNIPAQUE IOHEXOL 300 MG/ML  SOLN  COMPARISON:  CT scan of October 11, 2014.  FINDINGS: Increased lucency is noted throughout the L3 vertebral body with disruption of the posterior cortex consistent with metastatic disease. Increased right iliac metastatic lesion is noted. Bilateral pars defects are seen at L5. Visualized lung bases appear normal.  No gallstones are noted. Diffuse rounded low densities are noted throughout the hepatic parenchyma which are significantly increased compared to prior exam consistent with metastatic disease. The spleen and pancreas appear normal. Left adrenal gland and kidney appear normal. Right adrenal mass has significantly enlarged, now measuring 3.1 x 2.6 cm. Severe right hydronephrosis is noted due to mass involving and obstructing the proximal right ureter. This mass measures 2.9 x 2.2 cm. It extends along the proximal portion of the right ureter. 3.3 x 3.1 cm mass arises posteriorly from the upper pole of right kidney which is increased compared to prior exam. The appendix appears normal. Stool is noted in the right colon. No abnormal fluid collection is noted. Atherosclerotic calcifications of abdominal aorta are noted without aneurysm formation. Urinary bladder appears normal. There is no evidence of bowel obstruction. No abnormal fluid collection is noted. Urinary bladder appears normal. No significant adenopathy is noted.  IMPRESSION: Overall, there is significant worsening of metastatic disease seen throughout the abdomen and pelvis. Diffuse hepatic metastases are noted which are increased in number in size compared to prior exam.  Right adrenal and renal masses are also significantly increased compared to prior exam.  Increased right retroperitoneal mass is noted which surrounds the proximal right ureter and results in severe right hydronephrosis.  Worsening  metastatic lesions are noted in the right iliac bone and L3 vertebral body.   Electronically Signed   By: Sabino Dick M.D.   On: 11/29/2014 00:40    Scheduled Meds: . docusate sodium  100 mg Oral BID  . enoxaparin (LOVENOX) injection  40 mg Subcutaneous Q24H  . feeding supplement (ENSURE COMPLETE)  237 mL Oral TID BM  . fentaNYL  25 mcg Transdermal Q72H  . piperacillin-tazobactam (ZOSYN)  IV  3.375 g Intravenous 3 times per day  . polyethylene glycol  17 g Oral Daily  . vancomycin  750 mg Intravenous Q12H   Continuous Infusions: . sodium chloride 1,000 mL (11/30/14 0504)    Active Problems:   Back pain   Anemia   Hypercalcemia   Lung cancer   Metastasis  Time spent: 25min  Miral Hoopes, [facility] Pager [phone]. If 7PM-7AM, please contact night-coverage at [email or web address], password [facility] 11/30/2014, 1:29 PM  LOS: 2 days

## 2014-12-01 ENCOUNTER — Ambulatory Visit: Payer: Medicaid Other

## 2014-12-01 ENCOUNTER — Ambulatory Visit: Payer: Medicaid Other | Admitting: Radiation Oncology

## 2014-12-01 DIAGNOSIS — M549 Dorsalgia, unspecified: Secondary | ICD-10-CM

## 2014-12-01 DIAGNOSIS — C787 Secondary malignant neoplasm of liver and intrahepatic bile duct: Secondary | ICD-10-CM

## 2014-12-01 DIAGNOSIS — C3412 Malignant neoplasm of upper lobe, left bronchus or lung: Secondary | ICD-10-CM

## 2014-12-01 DIAGNOSIS — C7951 Secondary malignant neoplasm of bone: Secondary | ICD-10-CM

## 2014-12-01 DIAGNOSIS — M545 Low back pain, unspecified: Secondary | ICD-10-CM | POA: Diagnosis present

## 2014-12-01 DIAGNOSIS — Z9119 Patient's noncompliance with other medical treatment and regimen: Secondary | ICD-10-CM

## 2014-12-01 DIAGNOSIS — Z720 Tobacco use: Secondary | ICD-10-CM

## 2014-12-01 DIAGNOSIS — C7989 Secondary malignant neoplasm of other specified sites: Secondary | ICD-10-CM

## 2014-12-01 LAB — BASIC METABOLIC PANEL
ANION GAP: 13 (ref 5–15)
BUN: 13 mg/dL (ref 6–23)
CALCIUM: 10.2 mg/dL (ref 8.4–10.5)
CO2: 27 mEq/L (ref 19–32)
Chloride: 95 mEq/L — ABNORMAL LOW (ref 96–112)
Creatinine, Ser: 0.99 mg/dL (ref 0.50–1.35)
GFR calc Af Amer: >90 mL/min (ref >90)
GFR, EST NON AFRICAN AMERICAN: 85 mL/min — AB (ref >90)
GLUCOSE: 118 mg/dL — AB (ref 70–99)
Potassium: 4.3 mEq/L (ref 3.7–5.3)
Sodium: 135 mEq/L — ABNORMAL LOW (ref 137–147)

## 2014-12-01 NOTE — Progress Notes (Signed)
[facility] PROGRESS NOTE  Jacob Webb WEX:[id number] DOB: 1951/03/19 DOA: 11/28/2014 PCP: No PCP Per Patient Brief narrative 63 year old male with metastatic small cell lung cancer who underwent whole brain irradiation as well as palliative radiotherapy to the chest mass, systemic chemotherapy, palliative radiotherapy to abdominal metastases, left hip followed by systemic chemotherapy which was completed in October 2015. Patient presented with intractable back pain with findings of extensive lumbar spinal metastases with compression fracture of L3. Patient placed on empiric Decadron and pain control. Patient seen by his oncologist today who recommends that given significant disease progression and physical deconditioning he is not a candidate for any further treatment and recommend palliative care and hospice.   Assessment/Plan: Extensive small cell lung cancer with diffuse metastases Patient has abdominal, pelvis and extensive lumbar spine metastases. Has significant low back pain which is currently well controlled with fentanyl patch and when necessary Dilaudid. Continue bowel regimen. -Seen by his oncologist today and given extensive disease progression recommends that he would not be a candidate for further treatment and recommends palliative care for goals of care discussion and possibly hospice. He will discuss with Dr. Lisbeth Renshaw to evaluate for possible palliative radiotherapy to the lumbar spine and pelvis. Discussed plan with patient. He understands that he has poor prognosis but is not sure what to do at this time. I discussed options of palliative care and hospice and he is agreeable to discuss goals of care. I have called his daughter and left for a voice message. Palliative care consult placed.  Anemia Stable  Hypercalcemia Secondary to malignancy. Improved with hydration - Hydronephrosis Secondary to retroperitoneal lesions. Moderate to severe hydronephrosis was noted on  prior imaging as well. Renal function is currently stable.  Protein calorie malnutrition Continue supplements    DVT prophylaxis Subcutaneous Lovenox  Diet: Regular  Code Status: FULL CODE Family Communication: None at bedside. We'll update family Disposition Plan: Pending goals of care discussion  Consultants:  Oncology  Procedures:  None  Antibiotics:  None  HPI/Subjective: Patient seen and examined. Discussed MRI findings and oncology recommendations. He understands poor prognosis and limited treatment options but is not sure what to do. Agrees with discussing goals of care with palliative care team.  Objective: Filed Vitals:   12/01/14 0600  BP: 134/74  Pulse: 91  Temp: 98.4 F (36.9 C)  Resp: 16    Intake/Output Summary (Last 24 hours) at 12/01/14 1240 Last data filed at 12/01/14 1000  Gross per 24 hour  Intake   2826 ml  Output   1810 ml  Net   1016 ml   Filed Weights   11/28/14 2127  Weight: 63.504 kg (140 lb)    Exam:   General:  Middle aged thin built male in NAD  HEENT: temporal wasting, moist mucosa  Cardiovascular: NS1&S2, no murmurs  Respiratory: clear b/l, no added sounds  Abdomen: soft , NT, ND,  Musculoskeletal:Warm, no edema  CNS; AAOX3  Data Reviewed: Basic Metabolic Panel:  Recent Labs Lab 11/28/14 2103 11/29/14 0942 12/01/14 0456  NA 136* 134* 135*  K 4.7 4.6 4.3  CL 95* 95* 95*  CO2 26 30 27   GLUCOSE 86 106* 118*  BUN 29* 24* 13  CREATININE 0.99 1.05 0.99  CALCIUM 10.9* 10.3 10.2  MG  --  1.5  --   PHOS  --  3.0  --    Liver Function Tests:  Recent Labs Lab 11/28/14 2103 11/29/14 0942  AST 39* 36  ALT 32 26  ALKPHOS  216* 209*  BILITOT 0.6 0.5  PROT 8.4* 7.6  ALBUMIN 3.3* 3.1*   No results for input(s): LIPASE, AMYLASE in the last 168 hours. No results for input(s): AMMONIA in the last 168 hours. CBC:  Recent Labs Lab 11/28/14 2103 11/29/14 0942  WBC 9.9 8.2  NEUTROABS 7.7 6.5  HGB 10.3*  9.5*  HCT 33.2* 30.5*  MCV 86.2 85.9  PLT 169 148*   Cardiac Enzymes: No results for input(s): CKTOTAL, CKMB, CKMBINDEX, TROPONINI in the last 168 hours. BNP (last 3 results) No results for input(s): PROBNP in the last 8760 hours. CBG: No results for input(s): GLUCAP in the last 168 hours.  Recent Results (from the past 240 hour(s))  Urine culture     Status: None   Collection Time: 11/28/14  9:27 PM  Result Value Ref Range Status   Specimen Description URINE, RANDOM  Final   Special Requests Immunocompromised  Final   Culture  Setup Time   Final    11/29/2014 03:28 Performed at [place] Performed at [facility]   Final   Culture NO GROWTH Performed at [facility]   Final   Report Status 11/30/2014 FINAL  Final  Blood Culture (routine x 2)     Status: None (Preliminary result)   Collection Time: 11/28/14  9:27 PM  Result Value Ref Range Status   Specimen Description BLOOD RIGHT ANTECUBITAL  Final   Special Requests BOTTLES DRAWN AEROBIC AND ANAEROBIC 5CC  Final   Culture  Setup Time   Final    11/29/2014 03:11 Performed at [facility]    Culture   Final           BLOOD CULTURE RECEIVED NO GROWTH TO DATE CULTURE WILL BE HELD FOR 5 DAYS BEFORE ISSUING A FINAL NEGATIVE REPORT Performed at [facility]    Report Status PENDING  Incomplete  Blood Culture (routine x 2)     Status: None (Preliminary result)   Collection Time: 11/28/14 10:06 PM  Result Value Ref Range Status   Specimen Description BLOOD LEFT ANTECUBITAL  Final   Special Requests BOTTLES DRAWN AEROBIC AND ANAEROBIC 5CC  Final   Culture  Setup Time   Final    11/29/2014 03:11 Performed at [facility]    Culture   Final           BLOOD CULTURE RECEIVED NO GROWTH TO DATE CULTURE WILL BE HELD FOR 5 DAYS BEFORE ISSUING A FINAL NEGATIVE REPORT Performed at [facility]    Report Status PENDING  Incomplete      Studies: Mr Lumbar Spine W Wo Contrast  11/30/2014   CLINICAL DATA:  63 year old male with metastatic lung cancer. Severe Low back pain with sciatica, laterality on specified. Restaging. Subsequent encounter.  EXAM: MRI LUMBAR SPINE WITHOUT AND WITH CONTRAST  TECHNIQUE: Multiplanar and multiecho pulse sequences of the lumbar spine were obtained without and with intravenous contrast.  CONTRAST:  50mL MULTIHANCE GADOBENATE DIMEGLUMINE 529 MG/ML IV SOLN  COMPARISON:  CT Abdomen and Pelvis 11/29/2014 and earlier. No prior lumbar MRI.  FINDINGS: Normal lumbar segmentation confirmed on comparison. Widespread bony metastatic disease in the visualized spine. Subtotal tumor replacement of marrowL3 and S2 spinal levels. Perhaps mild loss of L3 vertebral body height. Early epidural tumor extension in the L3 right ventral epidural space and tracking into the right L3 neural foramen (series 8, images 32 and 33 and series 9, image  7. No other lumbar spine epidural or foraminal extension of tumor. There is mild bilateral as 3 foraminal extension of soft tissue tumor.  Scattered bone metastases up to 2 cm in size elsewhere. No levels are spared T11 through S3. Confluent involvement of the visualized medial iliac bones.  Visualized lower thoracic spinal cord is normal with conus medularis at L1. No abnormal intradural enhancement. Grossly normal cauda equina nerve roots.  Widespread abnormal increased T2 and STIR signal in the erector spinae muscles, no discrete posterior paraspinal muscle metastasis.  Superimposed degenerative L5-S1 spondylolisthesis and chronic spondylolysis.  Numerous liver metastases. Right para per renal metastases re - identified, including bad obstructing the proximal right ureter.  IMPRESSION: 1. Widespread osseous metastatic disease in the visualized spine and pelvis. 2. Mild pathologic compression fracture of L3 with early epidural tumor extension involving the exiting right L3 nerve. Mild bilateral  S3 neural foraminal tumor. 3. Widespread liver and right para renal metastases re- identified. 4. No lower spinal cord or cauda equina metastasis identified. 5. 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   Electronically Signed   By: Lars Pinks M.D.   On: 11/30/2014 09:55    Scheduled Meds: . dexamethasone  8 mg Oral Daily  . docusate sodium  100 mg Oral BID  . enoxaparin (LOVENOX) injection  40 mg Subcutaneous Q24H  . feeding supplement (ENSURE COMPLETE)  237 mL Oral TID BM  . fentaNYL  25 mcg Transdermal Q72H  . piperacillin-tazobactam (ZOSYN)  IV  3.375 g Intravenous 3 times per day  . polyethylene glycol  17 g Oral Daily  . vancomycin  750 mg Intravenous Q12H   Continuous Infusions:     Time spent: [place], [place]  [facility] Pager [phone]. If 7PM-7AM, please contact night-coverage at [email or web address], password [facility] 12/01/2014, 12:40 PM  LOS: 3 days

## 2014-12-01 NOTE — Progress Notes (Signed)
DIAGNOSIS AND STAGE: Extensive stage small cell lung cancer diagnosed in May of 2014   PRIOR THERAPY:  1) whole Brain irradiation as well as palliative radiotherapy to the chest mass.  2) Systemic chemotherapy with carboplatin for AUC of 5 on day 1 and etoposide 120 mg/M2 on days 1, 2 and 3 with Neulasta support on day 4, status post 5 cycles. First cycle was started on 05/25/2013.  3) palliative radiotherapy to the abdomen metastasis adjacent to the right kidney under the care of Dr. Lisbeth Renshaw completed on 01/26/2014. 4) palliative radiotherapy to the left hip under the care of Dr. Lisbeth Renshaw. 5) Systemic chemotherapy with carboplatin for AUC of 5 on day 1 and etoposide 100 MG/M2 on days 1, 2 and 3 with Neulasta support on day 4 status post 5 cycles. Last cycle was given 09/22/2014.  CURRENT THERAPY: None  CHEMOTHERAPY INTENT: Palliative  CURRENT # OF CHEMOTHERAPY CYCLES: 5 CURRENT ANTIEMETICS: Zofran, dexamethasone and Compazine  CURRENT SMOKING STATUS: Current smoker and strongly advised to quit smoking and offered smoke cessation program  ORAL CHEMOTHERAPY AND CONSENT: None  CURRENT BISPHOSPHONATES USE: None  PAIN MANAGEMENT: Well-managed with Percocet when necessary  NARCOTICS INDUCED CONSTIPATION: No constipation but has a stool softener at home  LIVING WILL AND CODE STATUS: Discussed with the patient and he is still Full code.  Subjective: The patient is seen and examined today. He was admitted 2 days ago with severe back pain started 1 week before his admission. His pain medication did not help and he presented to the emergency room for evaluation. He has been noncompliant with his treatment and follow-up recently. MRI of the lumbar spine showed metastatic bone lesions. He was started on fentanyl patch 25 g/hour every 3 days in addition to Dilaudid for breakthrough pain. He is feeling a little bit better with this regimen. CT scan of the abdomen and pelvis on the day of his admission  showed significant worsening of his metastatic disease throughout the abdomen and pelvis with diffuse hepatic metastasis that are increased in number and size compared to the prior exam. There was also increase in the right adrenal and renal masses as well as increased right retroperitoneal mass and widespread metastatic bone lesions. The patient denied having any other significant complaints today.  Objective: Vital signs in last 24 hours: Temp:  [98.2 F (36.8 C)-98.4 F (36.9 C)] 98.4 F (36.9 C) (12/16 0600) Pulse Rate:  [79-91] 91 (12/16 0600) Resp:  [16-20] 16 (12/16 0600) BP: (114-134)/(74-95) 134/74 mmHg (12/16 0600) SpO2:  [99 %-100 %] 99 % (12/16 0600)  Intake/Output from previous day: 12/15 0701 - 12/16 0700 In: 2796 [P.O.:270; I.V.:2276; IV Piggyback:250] Out: 2385 [Urine:2385] Intake/Output this shift:    General appearance: alert, cooperative, fatigued and no distress Resp: clear to auscultation bilaterally Cardio: regular rate and rhythm, S1, S2 normal, no murmur, click, rub or gallop GI: soft, non-tender; bowel sounds normal; no masses,  no organomegaly Extremities: extremities normal, atraumatic, no cyanosis or edema  Lab Results:   Recent Labs  11/28/14 2103 11/29/14 0942  WBC 9.9 8.2  HGB 10.3* 9.5*  HCT 33.2* 30.5*  PLT 169 148*   BMET  Recent Labs  11/29/14 0942 12/01/14 0456  NA 134* 135*  K 4.6 4.3  CL 95* 95*  CO2 30 27  GLUCOSE 106* 118*  BUN 24* 13  CREATININE 1.05 0.99  CALCIUM 10.3 10.2    Studies/Results: Mr Lumbar Spine W Wo Contrast  11/30/2014   CLINICAL DATA:  63 year old male with metastatic lung cancer. Severe Low back pain with sciatica, laterality on specified. Restaging. Subsequent encounter.  EXAM: MRI LUMBAR SPINE WITHOUT AND WITH CONTRAST  TECHNIQUE: Multiplanar and multiecho pulse sequences of the lumbar spine were obtained without and with intravenous contrast.  CONTRAST:  74mL MULTIHANCE GADOBENATE DIMEGLUMINE 529  MG/ML IV SOLN  COMPARISON:  CT Abdomen and Pelvis 11/29/2014 and earlier. No prior lumbar MRI.  FINDINGS: Normal lumbar segmentation confirmed on comparison. Widespread bony metastatic disease in the visualized spine. Subtotal tumor replacement of marrowL3 and S2 spinal levels. Perhaps mild loss of L3 vertebral body height. Early epidural tumor extension in the L3 right ventral epidural space and tracking into the right L3 neural foramen (series 8, images 32 and 33 and series 9, image 7. No other lumbar spine epidural or foraminal extension of tumor. There is mild bilateral as 3 foraminal extension of soft tissue tumor.  Scattered bone metastases up to 2 cm in size elsewhere. No levels are spared T11 through S3. Confluent involvement of the visualized medial iliac bones.  Visualized lower thoracic spinal cord is normal with conus medularis at L1. No abnormal intradural enhancement. Grossly normal cauda equina nerve roots.  Widespread abnormal increased T2 and STIR signal in the erector spinae muscles, no discrete posterior paraspinal muscle metastasis.  Superimposed degenerative L5-S1 spondylolisthesis and chronic spondylolysis.  Numerous liver metastases. Right para per renal metastases re - identified, including bad obstructing the proximal right ureter.  IMPRESSION: 1. Widespread osseous metastatic disease in the visualized spine and pelvis. 2. Mild pathologic compression fracture of L3 with early epidural tumor extension involving the exiting right L3 nerve. Mild bilateral S3 neural foraminal tumor. 3. Widespread liver and right para renal metastases re- identified. 4. No lower spinal cord or cauda equina metastasis identified. 5. 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   Electronically Signed   By: Lars Pinks M.D.   On: 11/30/2014 09:55    Medications: I have reviewed the patient's current medications.   Assessment/Plan: 1) extensive stage small cell lung cancer: Diagnosed in May  2014 status post several chemotherapy regimens. The patient has been doing fairly well so far until recently. He exceeded the expected survival of 6-9 months for extensive stage small cell lung cancer. He is currently almost 20 months from the time of his diagnosis. Unfortunately the recent CT scan of the abdomen and pelvis showed significant evidence for disease progression. He is not a good candidate for any further treatment especially with the deterioration in his general condition and noncompliance with treatment. I strongly recommend for the patient palliative care and hospice at this point. 2) metastatic bone lesions in the lumbar spines and pelvis: I will consult Dr. Lisbeth Renshaw for evaluation and consideration of palliative radiotherapy to this area. Continue current pain medications. Thank you for taking good care of Jacob Webb, I will continue to follow-up the patient with you and assist in his management on as-needed basis.   LOS: 3 days    Jacob Webb K. 12/01/2014

## 2014-12-02 ENCOUNTER — Ambulatory Visit
Admit: 2014-12-02 | Discharge: 2014-12-02 | Disposition: A | Discharge status: Home / Self Care | Payer: Medicaid Other | Admitting: Radiation Oncology

## 2014-12-02 ENCOUNTER — Institutional Professional Consult (permissible substitution): Payer: Medicaid Other | Admitting: Radiation Oncology

## 2014-12-02 DIAGNOSIS — C7800 Secondary malignant neoplasm of unspecified lung: Secondary | ICD-10-CM

## 2014-12-02 DIAGNOSIS — M545 Low back pain: Secondary | ICD-10-CM

## 2014-12-02 NOTE — Progress Notes (Signed)
Radiation Oncology         [phone] ________________________________  Name: Jacob Webb MRN: [id number]  Date: 11/28/2014  DOB: 04/10/1951  Diagnosis:   Extensive stage small cell lung cancer   Narrative:  The patient has been admitted with intractable back pain. He states that his pain is better controlled at this time but this area in the lower back still is causing significant pain. He denies other major areas of pain currently. The patient's imaging does show extensive progression including in the area in question regarding his pain.                        ALLERGIES:  is allergic to tylenol.  Meds: Current Facility-Administered Medications  Medication Dose Route Frequency Provider Last Rate Last Dose  . bisacodyl (DULCOLAX) suppository 10 mg  10 mg Rectal Daily PRN Donne Hazel, MD   10 mg at 12/01/14 0748  . dexamethasone (DECADRON) tablet 8 mg  8 mg Oral Daily Donne Hazel, MD   8 mg at 12/02/14 0907  . docusate sodium (COLACE) capsule 100 mg  100 mg Oral BID Jani Gravel, MD   100 mg at 12/02/14 0907  . enoxaparin (LOVENOX) injection 40 mg  40 mg Subcutaneous Q24H Jani Gravel, MD   40 mg at 12/02/14 0907  . feeding supplement (ENSURE COMPLETE) (ENSURE COMPLETE) liquid 237 mL  237 mL Oral TID BM Clayton Bibles, RD   237 mL at 12/01/14 2105  . fentaNYL (DURAGESIC - dosed mcg/hr) patch 25 mcg  25 mcg Transdermal Q72H Donne Hazel, MD   25 mcg at 11/30/14 1026  . HYDROmorphone (DILAUDID) injection 1 mg  1 mg Intravenous Q3H PRN Donne Hazel, MD   1 mg at 12/02/14 1656  . HYDROmorphone (DILAUDID) tablet 2 mg  2 mg Oral Q6H PRN Jani Gravel, MD   2 mg at 12/02/14 1435  . ondansetron (ZOFRAN-ODT) disintegrating tablet 8 mg  8 mg Oral Q8H PRN Jani Gravel, MD      . polyethylene glycol [facility] / GLYCOLAX) packet 17 g  17 g Oral Daily Jani Gravel, MD   17 g at 12/02/14 0907    Physical Findings: The patient is in no acute distress. Patient is alert and oriented.  height is 5\' 11"   (1.803 m) and weight is 112 lb 11.2 oz (51.12 kg). His oral temperature is 98.2 F (36.8 C). His blood pressure is 136/70 and his pulse is 90. His respiration is 16 and oxygen saturation is 98%. .     Lab Findings: Lab Results  Component Value Date   WBC 8.2 11/29/2014   HGB 9.5* 11/29/2014   HCT 30.5* 11/29/2014   MCV 85.9 11/29/2014   PLT 148* 11/29/2014     Radiographic Findings: Dg Chest 2 View  11/28/2014   CLINICAL DATA:  Upper back pain for 2 days  EXAM: CHEST  2 VIEW  COMPARISON:  10/15/2014  FINDINGS: The cardiac shadow is within normal limits. The lungs are clear bilaterally. Nipple shadows are seen bilaterally. There are changes consistent with prior radiation in the left medial lung apex which are stable from the prior exam. No acute bony abnormality is noted.  IMPRESSION: Chronic changes without acute abnormality.   Electronically Signed   By: Inez Catalina M.D.   On: 11/28/2014 20:47   Dg Thoracic Spine 2 View  11/28/2014   CLINICAL DATA:  Upper back pain, initial encounter  EXAM: THORACIC SPINE -  2 VIEW  COMPARISON:  None.  FINDINGS: There is no evidence of thoracic spine fracture. Alignment is normal. No other significant bone abnormalities are identified.  IMPRESSION: No acute abnormality noted.   Electronically Signed   By: Inez Catalina M.D.   On: 11/28/2014 20:48   Dg Lumbar Spine Complete  11/28/2014   CLINICAL DATA:  Low back pain, initial encounter  EXAM: LUMBAR SPINE - COMPLETE 4+ VIEW  COMPARISON:  11/18/2014, 10/11/2014  FINDINGS: Bilateral L5 pars defects are noted. Mildly anterolisthesis is noted of L5 on S1. No acute compression deformity is seen. The metastatic changes in the L3 vertebral body are not well appreciated on this exam. No soft tissue abnormality is seen. Diffuse aortic calcifications are noted.  IMPRESSION: Chronic changes, no acute abnormality is noted.   Electronically Signed   By: Inez Catalina M.D.   On: 11/28/2014 20:51   Dg Lumbar Spine  Complete  11/18/2014   CLINICAL DATA:  Status post fall, trauma  EXAM: LUMBAR SPINE - COMPLETE 4+ VIEW  COMPARISON:  None.  FINDINGS: There is no evidence of lumbar spine fracture. Alignment is normal. Intervertebral disc spaces are maintained.  There is abdominal aortic atherosclerosis.  IMPRESSION: No acute osseous injury of the lumbar spine.   Electronically Signed   By: Kathreen Devoid   On: 11/18/2014 17:23   Ct Head Wo Contrast  11/28/2014   CLINICAL DATA:  Recent fall.  No loss of consciousness.  EXAM: CT HEAD WITHOUT CONTRAST  CT CERVICAL SPINE WITHOUT CONTRAST  TECHNIQUE: Multidetector CT imaging of the head and cervical spine was performed following the standard protocol without intravenous contrast. Multiplanar CT image reconstructions of the cervical spine were also generated.  COMPARISON:  CT scan of head of September 11, 2013. MRI of September 01, 2014.  FINDINGS: CT HEAD FINDINGS  Bony calvarium appears intact. Moderate chronic ischemic white matter disease is noted. No mass effect or midline shift is noted. Ventricular size is within normal limits. There is no evidence of mass lesion, hemorrhage or acute infarction.  CT CERVICAL SPINE FINDINGS  No fracture or spondylolisthesis is noted. Mild anterior osteophyte formation is noted at C4-5, C5-6 and C6-7. Posterior facet joints appear normal.  IMPRESSION: Moderate diffuse cortical atrophy. No gross intracranial abnormality seen.  Mild degenerative changes as described above. No acute abnormality seen in the cervical spine.   Electronically Signed   By: Sabino Dick M.D.   On: 11/28/2014 20:34   Ct Cervical Spine Wo Contrast  11/28/2014   CLINICAL DATA:  Recent fall.  No loss of consciousness.  EXAM: CT HEAD WITHOUT CONTRAST  CT CERVICAL SPINE WITHOUT CONTRAST  TECHNIQUE: Multidetector CT imaging of the head and cervical spine was performed following the standard protocol without intravenous contrast. Multiplanar CT image reconstructions of the  cervical spine were also generated.  COMPARISON:  CT scan of head of September 11, 2013. MRI of September 01, 2014.  FINDINGS: CT HEAD FINDINGS  Bony calvarium appears intact. Moderate chronic ischemic white matter disease is noted. No mass effect or midline shift is noted. Ventricular size is within normal limits. There is no evidence of mass lesion, hemorrhage or acute infarction.  CT CERVICAL SPINE FINDINGS  No fracture or spondylolisthesis is noted. Mild anterior osteophyte formation is noted at C4-5, C5-6 and C6-7. Posterior facet joints appear normal.  IMPRESSION: Moderate diffuse cortical atrophy. No gross intracranial abnormality seen.  Mild degenerative changes as described above. No acute abnormality seen in the cervical  spine.   Electronically Signed   By: Sabino Dick M.D.   On: 11/28/2014 20:34   Mr Lumbar Spine W Wo Contrast  11/30/2014   CLINICAL DATA:  63 year old male with metastatic lung cancer. Severe Low back pain with sciatica, laterality on specified. Restaging. Subsequent encounter.  EXAM: MRI LUMBAR SPINE WITHOUT AND WITH CONTRAST  TECHNIQUE: Multiplanar and multiecho pulse sequences of the lumbar spine were obtained without and with intravenous contrast.  CONTRAST:  48mL MULTIHANCE GADOBENATE DIMEGLUMINE 529 MG/ML IV SOLN  COMPARISON:  CT Abdomen and Pelvis 11/29/2014 and earlier. No prior lumbar MRI.  FINDINGS: Normal lumbar segmentation confirmed on comparison. Widespread bony metastatic disease in the visualized spine. Subtotal tumor replacement of marrowL3 and S2 spinal levels. Perhaps mild loss of L3 vertebral body height. Early epidural tumor extension in the L3 right ventral epidural space and tracking into the right L3 neural foramen (series 8, images 32 and 33 and series 9, image 7. No other lumbar spine epidural or foraminal extension of tumor. There is mild bilateral as 3 foraminal extension of soft tissue tumor.  Scattered bone metastases up to 2 cm in size elsewhere. No  levels are spared T11 through S3. Confluent involvement of the visualized medial iliac bones.  Visualized lower thoracic spinal cord is normal with conus medularis at L1. No abnormal intradural enhancement. Grossly normal cauda equina nerve roots.  Widespread abnormal increased T2 and STIR signal in the erector spinae muscles, no discrete posterior paraspinal muscle metastasis.  Superimposed degenerative L5-S1 spondylolisthesis and chronic spondylolysis.  Numerous liver metastases. Right para per renal metastases re - identified, including bad obstructing the proximal right ureter.  IMPRESSION: 1. Widespread osseous metastatic disease in the visualized spine and pelvis. 2. Mild pathologic compression fracture of L3 with early epidural tumor extension involving the exiting right L3 nerve. Mild bilateral S3 neural foraminal tumor. 3. Widespread liver and right para renal metastases re- identified. 4. No lower spinal cord or cauda equina metastasis identified. 5. 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   Electronically Signed   By: Lars Pinks M.D.   On: 11/30/2014 09:55   Ct Abdomen Pelvis W Contrast  11/29/2014   CLINICAL DATA:  Current history of metastatic lung cancer. Low back pain.  EXAM: CT ABDOMEN AND PELVIS WITH CONTRAST  TECHNIQUE: Multidetector CT imaging of the abdomen and pelvis was performed using the standard protocol following bolus administration of intravenous contrast.  CONTRAST:  149mL OMNIPAQUE IOHEXOL 300 MG/ML  SOLN  COMPARISON:  CT scan of October 11, 2014.  FINDINGS: Increased lucency is noted throughout the L3 vertebral body with disruption of the posterior cortex consistent with metastatic disease. Increased right iliac metastatic lesion is noted. Bilateral pars defects are seen at L5. Visualized lung bases appear normal.  No gallstones are noted. Diffuse rounded low densities are noted throughout the hepatic parenchyma which are significantly increased compared to  prior exam consistent with metastatic disease. The spleen and pancreas appear normal. Left adrenal gland and kidney appear normal. Right adrenal mass has significantly enlarged, now measuring 3.1 x 2.6 cm. Severe right hydronephrosis is noted due to mass involving and obstructing the proximal right ureter. This mass measures 2.9 x 2.2 cm. It extends along the proximal portion of the right ureter. 3.3 x 3.1 cm mass arises posteriorly from the upper pole of right kidney which is increased compared to prior exam. The appendix appears normal. Stool is noted in the right colon. No abnormal fluid collection is noted. Atherosclerotic calcifications  of abdominal aorta are noted without aneurysm formation. Urinary bladder appears normal. There is no evidence of bowel obstruction. No abnormal fluid collection is noted. Urinary bladder appears normal. No significant adenopathy is noted.  IMPRESSION: Overall, there is significant worsening of metastatic disease seen throughout the abdomen and pelvis. Diffuse hepatic metastases are noted which are increased in number in size compared to prior exam.  Right adrenal and renal masses are also significantly increased compared to prior exam.  Increased right retroperitoneal mass is noted which surrounds the proximal right ureter and results in severe right hydronephrosis.  Worsening metastatic lesions are noted in the right iliac bone and L3 vertebral body.   Electronically Signed   By: Sabino Dick M.D.   On: 11/29/2014 00:40    Impression/plan:    The patient has extensive stage small cell lung cancer and unfortunately is experiencing significant multifocal progression of his disease. The patient's overall status has declined since I have seen him last. His dominate complaint however right now is significant low back/pelvic pain with radiation especially on the right to the lower extremity. I believe palliative radiation treatment would help to ameliorate this and I discussed  this with the patient. He is going to be seen by palliative care as well to help clarify his goals of treatment. I discussed with him that if radiation treatment, likely 5-8 treatments (5 treatments if possible), is consistent with his overall goals and overall plan of care then we could proceed with simulation tomorrow and begin his treatment early next week on Monday.   Jodelle Gross, M.D., Ph.D.

## 2014-12-02 NOTE — Progress Notes (Signed)
[facility] PROGRESS NOTE  Jacob Webb FYB:[id number] DOB: [birth year] DOA: 11/28/2014 PCP: No PCP Per Patient  Brief narrative 63 year old male with metastatic small cell lung cancer who underwent whole brain irradiation as well as palliative radiotherapy to the chest mass, systemic chemotherapy, palliative radiotherapy to abdominal metastases, left hip followed by systemic chemotherapy which was completed in October 2015. Patient presented with intractable back pain with findings of extensive lumbar spinal metastases with compression fracture of L3. Patient placed on empiric Decadron and pain control. Patient seen by his oncologist today who recommends that given significant disease progression and physical deconditioning he is not a candidate for any further treatment and recommend palliative care and hospice.   Assessment/Plan: Extensive small cell lung cancer with diffuse metastases Patient has abdominal, pelvis and extensive lumbar spine metastases. Has significant low back pain which is currently well controlled with fentanyl patch and when necessary Dilaudid. Continue bowel regimen. -Seen by his oncologist and given extensive disease progression recommends that he would not be a candidate for further treatment and recommends palliative care for goals of care discussion and possibly hospice. After Jacob Webb will evaluate patient for palliative radiotherapy  Patient understands that he has poor prognosis.  I discussed options of palliative care and hospice and he is agreeable to discuss goals of care. I have not been able to reach his daughter. Palliative care consulted.  Anemia Stable  Hypercalcemia Secondary to malignancy. Improved with hydration - Hydronephrosis Secondary to retroperitoneal lesions. Moderate to severe hydronephrosis was noted on prior imaging as well. Renal function is currently stable.  Protein calorie malnutrition Continue supplements    DVT  prophylaxis Subcutaneous Lovenox  Diet: Regular  Code Status: FULL CODE Family Communication: None at bedside. Hold daughter and left a message yesterday. Was unable to reach her today as well Disposition Plan: Pending goals of care discussion with palliative care  Consultants:  Oncology  Procedures:  None  Antibiotics:  None  HPI/Subjective: Patient appears in a better mood today. Awaiting discussion with palliative care team.  Objective: Filed Vitals:   12/02/14 1400  BP: 136/70  Pulse: 90  Temp: 98.2 F (36.8 C)  Resp: 16    Intake/Output Summary (Last 24 hours) at 12/02/14 1551 Last data filed at 12/02/14 1400  Gross per 24 hour  Intake    410 ml  Output    625 ml  Net   -215 ml   Filed Weights   11/28/14 2127 12/02/14 0527  Weight: 63.504 kg (140 lb) 51.12 kg (112 lb 11.2 oz)    Exam:   General: Middle aged thin built male in NAD  HEENT: temporal wasting, moist mucosa  Cardiovascular: NS1&S2, no murmurs  Respiratory: clear b/l, no added sounds  Abdomen: soft , NT, ND,  Musculoskeletal:Warm, no edema, low back tenderness    Data Reviewed: Basic Metabolic Panel:  Recent Labs Lab 11/28/14 2103 11/29/14 0942 12/01/14 0456  NA 136* 134* 135*  K 4.7 4.6 4.3  CL 95* 95* 95*  CO2 26 30 27   GLUCOSE 86 106* 118*  BUN 29* 24* 13  CREATININE 0.99 1.05 0.99  CALCIUM 10.9* 10.3 10.2  MG  --  1.5  --   PHOS  --  3.0  --    Liver Function Tests:  Recent Labs Lab 11/28/14 2103 11/29/14 0942  AST 39* 36  ALT 32 26  ALKPHOS 216* 209*  BILITOT 0.6 0.5  PROT 8.4* 7.6  ALBUMIN 3.3* 3.1*   No results for input(s): LIPASE,  AMYLASE in the last 168 hours. No results for input(s): AMMONIA in the last 168 hours. CBC:  Recent Labs Lab 11/28/14 2103 11/29/14 0942  WBC 9.9 8.2  NEUTROABS 7.7 6.5  HGB 10.3* 9.5*  HCT 33.2* 30.5*  MCV 86.2 85.9  PLT 169 148*   Cardiac Enzymes: No results for input(s): CKTOTAL, CKMB, CKMBINDEX,  TROPONINI in the last 168 hours. BNP (last 3 results) No results for input(s): PROBNP in the last 8760 hours. CBG: No results for input(s): GLUCAP in the last 168 hours.  Recent Results (from the past 240 hour(s))  Urine culture     Status: None   Collection Time: 11/28/14  9:27 PM  Result Value Ref Range Status   Specimen Description URINE, RANDOM  Final   Special Requests Immunocompromised  Final   Culture  Setup Time   Final    11/29/2014 03:28 Performed at [place] Performed at [facility]   Final   Culture NO GROWTH Performed at [facility]   Final   Report Status 11/30/2014 FINAL  Final  Blood Culture (routine x 2)     Status: None (Preliminary result)   Collection Time: 11/28/14  9:27 PM  Result Value Ref Range Status   Specimen Description BLOOD RIGHT ANTECUBITAL  Final   Special Requests BOTTLES DRAWN AEROBIC AND ANAEROBIC 5CC  Final   Culture  Setup Time   Final    11/29/2014 03:11 Performed at [facility]    Culture   Final           BLOOD CULTURE RECEIVED NO GROWTH TO DATE CULTURE WILL BE HELD FOR 5 DAYS BEFORE ISSUING A FINAL NEGATIVE REPORT Performed at [facility]    Report Status PENDING  Incomplete  Blood Culture (routine x 2)     Status: None (Preliminary result)   Collection Time: 11/28/14 10:06 PM  Result Value Ref Range Status   Specimen Description BLOOD LEFT ANTECUBITAL  Final   Special Requests BOTTLES DRAWN AEROBIC AND ANAEROBIC 5CC  Final   Culture  Setup Time   Final    11/29/2014 03:11 Performed at [facility]    Culture   Final           BLOOD CULTURE RECEIVED NO GROWTH TO DATE CULTURE WILL BE HELD FOR 5 DAYS BEFORE ISSUING A FINAL NEGATIVE REPORT Performed at [facility]    Report Status PENDING  Incomplete     Studies: No results found.  Scheduled Meds: . dexamethasone  8 mg Oral Daily  . docusate sodium  100 mg Oral BID  . enoxaparin  (LOVENOX) injection  40 mg Subcutaneous Q24H  . feeding supplement (ENSURE COMPLETE)  237 mL Oral TID BM  . fentaNYL  25 mcg Transdermal Q72H  . polyethylene glycol  17 g Oral Daily   Continuous Infusions:     Time spent: 25 minutes    Jacob Webb  [facility] Pager [phone] If 7PM-7AM, please contact night-coverage at [email or web address], password [facility] 12/02/2014, 3:51 PM  LOS: 4 days

## 2014-12-02 NOTE — Clinical Social Work Note (Signed)
[place] Work  Clinical Social Work is familiar with patient from outpatient setting- has worked with patient/family since patient's diagnosis.  CSW met with patient at bedside to provide emotional support.     Patient's knowledge about cancer and its treatment including level of understanding, reactions, goals for care, and expectations:  Jacob Webb, "Jacob Webb" shared that he trusts his oncologists and their recommendations regarding care. He was unable to explain to CSW his current condition as it relates to his cancer, but reported experiencing a lot of pain. The patient shared that "a doctor brought up Hospice".  CSW asked patient to share his understanding of Hospice.  Jacob Webb shared his family member utilized Hospice to get support at home and it was "good for them".  CSW gave information on Hospice at home and residential Hospice- Jacob Webb was open to these options if there is a need for these services in the future. CSW briefly explained the purpose of palliative care [place] meeting.  CSW encouraged patient to share what was most important to him at this time- Jacob Webb shared "to live as long as I can and enjoy life".  CSW made multiple attempts to reach [name], patient's daughter.  CSW left message to contact CSW as soon as possible.  Characteristics of the patient's support system:  The patient lives with his daughter and three grandchildren.  They recently relocated to [place] from [place], [place].  Jacob Webb receives a [employer] and is the only source of income in the household.  They recently had no power or water at their previous residence for several months and were evicted.  The patient identifies that his daughter is supportive, but "has her hands full with the youngins".  Patient has a limited support network.  Patient and family psychosocial functioning including strengths, limitations, and coping skills:  Jacob Webb shared that he trusts his oncologists and their  recommendations regarding care.  Jacob Webb strengths include his resiliency and positive outlook (CSW and patient discussed concept of hope and how hope may change, during visit).    Identifications of barriers to care: limited finances, limited social support, lack of transportation  Availability of community resources:  CSW provided patient with [employer] gas card (which patient applied for previously), patient has utilized [facility] services and applied for multiple grants with CSW in the past  Clinical Social Worker follow up needed: Yes.    If yes, follow up plan:  CSW will be out of the office tomorrow, Friday 12/03/14.  CSW will follow up on Monday.   Polo Webb, MSW, LCSW, OSW-C Clinical Social Worker [facility] [phone]

## 2014-12-02 NOTE — Progress Notes (Signed)
I have been made aware of the patient's admission. He was scheduled to see me for possible palliative radiation treatment, had missed an appointment and had been re-scheduled. Recent imaging shows progression, including bony disease in spine and pelvis. I will evaluate the patient today and discuss possible radiation treatment. Could proceed with simulation tomorrow 12/18.

## 2014-12-03 DIAGNOSIS — Z66 Do not resuscitate: Secondary | ICD-10-CM | POA: Diagnosis not present

## 2014-12-03 DIAGNOSIS — Z789 Other specified health status: Secondary | ICD-10-CM

## 2014-12-03 LAB — VITAMIN D 1,25 DIHYDROXY
VITAMIN D3 1, 25 (OH): 22 pg/mL
Vitamin D 1, 25 (OH)2 Total: 22 pg/mL (ref 18–72)
Vitamin D2 1, 25 (OH)2: <8 pg/mL

## 2014-12-03 MED ORDER — ONDANSETRON HCL 4 MG/2ML IJ SOLN
4.0000 mg | Freq: Four times a day (QID) | INTRAMUSCULAR | Status: DC | PRN
  Administered 2014-12-03: 4 mg via INTRAVENOUS
  Filled 2014-12-03: qty 2

## 2014-12-03 MED ORDER — DIPHENHYDRAMINE HCL 50 MG/ML IJ SOLN: 12.5000 mg | Freq: Four times a day (QID) | INTRAMUSCULAR | Status: DC | PRN

## 2014-12-03 MED ORDER — NICOTINE 14 MG/24HR TD PT24
14.0000 mg | MEDICATED_PATCH | Freq: Every day | TRANSDERMAL | Status: DC
  Administered 2014-12-03 – 2014-12-06 (×4): 14 mg via TRANSDERMAL
  Filled 2014-12-03 (×4): qty 1

## 2014-12-03 MED ORDER — SODIUM CHLORIDE 0.9 % IJ SOLN: 9.0000 mL | INTRAMUSCULAR | Status: DC | PRN

## 2014-12-03 MED ORDER — DEXAMETHASONE 4 MG PO TABS
8.0000 mg | ORAL_TABLET | Freq: Two times a day (BID) | ORAL | Status: DC
  Administered 2014-12-03 – 2014-12-06 (×6): 8 mg via ORAL
  Filled 2014-12-03 (×8): qty 2

## 2014-12-03 MED ORDER — DIPHENHYDRAMINE HCL 12.5 MG/5ML PO ELIX: 12.5000 mg | ORAL_SOLUTION | Freq: Four times a day (QID) | ORAL | Status: DC | PRN

## 2014-12-03 MED ORDER — NALOXONE HCL 0.4 MG/ML IJ SOLN: 0.4000 mg | INTRAMUSCULAR | Status: DC | PRN

## 2014-12-03 MED ORDER — SENNOSIDES-DOCUSATE SODIUM 8.6-50 MG PO TABS
2.0000 | ORAL_TABLET | Freq: Every day | ORAL | Status: DC
  Administered 2014-12-03 – 2014-12-05 (×3): 2 via ORAL
  Filled 2014-12-03 (×4): qty 2

## 2014-12-03 MED ORDER — HYDROMORPHONE 0.3 MG/ML IV SOLN
INTRAVENOUS | Status: DC
  Administered 2014-12-03: 3.49 mg via INTRAVENOUS
  Administered 2014-12-03: 2.49 mg via INTRAVENOUS
  Administered 2014-12-03: 13:00:00 via INTRAVENOUS
  Administered 2014-12-03: 1.49 mg via INTRAVENOUS
  Administered 2014-12-04: 3.87 mg via INTRAVENOUS
  Administered 2014-12-04: 14:00:00 via INTRAVENOUS
  Administered 2014-12-04 (×2): 2.99 mg via INTRAVENOUS
  Administered 2014-12-04: 1.49 mg via INTRAVENOUS
  Administered 2014-12-04: 1.5 mg via INTRAVENOUS
  Administered 2014-12-04: 22:00:00 via INTRAVENOUS
  Administered 2014-12-05: 0.06 mg via INTRAVENOUS
  Administered 2014-12-05: 1.37 mg via INTRAVENOUS
  Administered 2014-12-05: 1.87 mg via INTRAVENOUS
  Administered 2014-12-05: 3.09 mg via INTRAVENOUS
  Administered 2014-12-05: 18:00:00 via INTRAVENOUS
  Administered 2014-12-06: 2.65 mg via INTRAVENOUS
  Administered 2014-12-06: 12:00:00 via INTRAVENOUS
  Administered 2014-12-06: 1.99 mg via INTRAVENOUS
  Administered 2014-12-06: 3.99 mg via INTRAVENOUS
  Administered 2014-12-06: 2.99 mg via INTRAVENOUS
  Filled 2014-12-03 (×7): qty 25

## 2014-12-03 MED ORDER — SODIUM CHLORIDE 0.9 % IJ SOLN
10.0000 mL | INTRAMUSCULAR | Status: DC | PRN
  Administered 2014-12-06: 10 mL
  Filled 2014-12-03: qty 40

## 2014-12-03 MED ORDER — HYDROMORPHONE 0.3 MG/ML IV SOLN: INTRAVENOUS | Status: DC

## 2014-12-03 MED ORDER — CELECOXIB 100 MG PO CAPS
100.0000 mg | ORAL_CAPSULE | Freq: Two times a day (BID) | ORAL | Status: DC
  Administered 2014-12-03 – 2014-12-06 (×7): 100 mg via ORAL
  Filled 2014-12-03 (×8): qty 1

## 2014-12-03 MED ORDER — ONDANSETRON HCL 4 MG/2ML IJ SOLN: 4.0000 mg | Freq: Four times a day (QID) | INTRAMUSCULAR | Status: DC | PRN

## 2014-12-03 MED ORDER — SODIUM CHLORIDE 0.9 % IV SOLN
90.0000 mg | Freq: Once | INTRAVENOUS | Status: AC
  Administered 2014-12-03: 90 mg via INTRAVENOUS
  Filled 2014-12-03: qty 10

## 2014-12-03 NOTE — Progress Notes (Signed)
[facility] PROGRESS NOTE  Jacob Webb ONG:[id number] DOB: [birth year] DOA: 11/28/2014 PCP: No PCP Per Patient  Brief narrative 63 year old male with metastatic small cell lung cancer who underwent whole brain irradiation as well as palliative radiotherapy to the chest mass, systemic chemotherapy, palliative radiotherapy to abdominal metastases, left hip followed by systemic chemotherapy which was completed in October 2015. Patient presented with intractable back pain with findings of extensive lumbar spinal metastases with compression fracture of L3. Patient placed on empiric Decadron and pain control. Patient seen by his oncologist today who recommends that given significant disease progression and physical deconditioning he is not a candidate for any further treatment and recommend palliative care and hospice.   Assessment/Plan: Extensive small cell lung cancer with diffuse metastases Patient has abdominal, pelvis and extensive lumbar spine metastases. Has significant low back pain not completely controlled on fentanyl patch and IV and oral Dilaudid. Continue bowel regimen. -Seen by his oncologist and given extensive disease progression recommends that he would not be a candidate for further treatment and recommends palliative care for goals of care discussion and possibly hospice. Radiation oncology evaluated  patient for palliative radiotherapy. Palliative care consult met with patient and his daughter Shari Heritage and had lengthy discussion today. Patient and his daughter understand his poor prognosis. He is now DO NOT RESUSCITATE and referral for residential hospice has been made. Daughter has significant financial issues and unable to take care of her father at home.  Order for PICC line and started Dilaudid PCA. Ordered bisphosphonate . Increase Decadron to 8 mg twice daily and added Celebrex for better pain control. -[facility]. Has been accepted that and can be  discharged on 12/21.     Anemia Stable  Hypercalcemia Secondary to malignancy. Improved with hydration.  - Hydronephrosis Secondary to retroperitoneal lesions. Moderate to severe hydronephrosis was noted on prior imaging as well. Renal function is currently stable.  Protein calorie malnutrition Continue supplements    DVT prophylaxis Subcutaneous Lovenox  Diet: Regular  Code Status: FULL CODE Family Communication: Daughter at bedside  Disposition Plan: Residential hospice on 12/21  Consultants:  Oncology  Palliative care  Procedures:  None  Antibiotics:  None  HPI/Subjective: Patient seen and examined. Daughter at bedside. Discussed goals of care addressed by palliative care and both patient and his daughter understand poor prognosis and agree on residential hospice. Discussed plan on pain control.  Objective: Filed Vitals:   12/03/14 1411  BP: 121/74  Pulse: 83  Temp: 98.4 F (36.9 C)  Resp: 16    Intake/Output Summary (Last 24 hours) at 12/03/14 1447 Last data filed at 12/03/14 1402  Gross per 24 hour  Intake    560 ml  Output   2125 ml  Net  -1565 ml   Filed Weights   11/28/14 2127 12/02/14 0527 12/03/14 0441  Weight: 63.504 kg (140 lb) 51.12 kg (112 lb 11.2 oz) 51.1 kg (112 lb 10.5 oz)    Exam:   General: Middle aged thin built male in NAD  HEENT:  moist mucosa  Cardiovascular: NS1&S2, no murmurs  Respiratory: clear b/l, no added sounds  Abdomen: soft , NT, ND,  Musculoskeletal:Warm, no edema, low back tenderness  Data Reviewed: Basic Metabolic Panel:  Recent Labs Lab 11/28/14 2103 11/29/14 0942 12/01/14 0456  NA 136* 134* 135*  K 4.7 4.6 4.3  CL 95* 95* 95*  CO2 26 30 27   GLUCOSE 86 106* 118*  BUN 29* 24* 13  CREATININE 0.99 1.05 0.99  CALCIUM 10.9* 10.3 10.2  MG  --  1.5  --   PHOS  --  3.0  --    Liver Function Tests:  Recent Labs Lab 11/28/14 2103 11/29/14 0942  AST 39* 36  ALT 32 26  ALKPHOS 216* 209*   BILITOT 0.6 0.5  PROT 8.4* 7.6  ALBUMIN 3.3* 3.1*   No results for input(s): LIPASE, AMYLASE in the last 168 hours. No results for input(s): AMMONIA in the last 168 hours. CBC:  Recent Labs Lab 11/28/14 2103 11/29/14 0942  WBC 9.9 8.2  NEUTROABS 7.7 6.5  HGB 10.3* 9.5*  HCT 33.2* 30.5*  MCV 86.2 85.9  PLT 169 148*   Cardiac Enzymes: No results for input(s): CKTOTAL, CKMB, CKMBINDEX, TROPONINI in the last 168 hours. BNP (last 3 results) No results for input(s): PROBNP in the last 8760 hours. CBG: No results for input(s): GLUCAP in the last 168 hours.  Recent Results (from the past 240 hour(s))  Urine culture     Status: None   Collection Time: 11/28/14  9:27 PM  Result Value Ref Range Status   Specimen Description URINE, RANDOM  Final   Special Requests Immunocompromised  Final   Culture  Setup Time   Final    11/29/2014 03:28 Performed at [place] Performed at [facility]   Final   Culture NO GROWTH Performed at [facility]   Final   Report Status 11/30/2014 FINAL  Final  Blood Culture (routine x 2)     Status: None (Preliminary result)   Collection Time: 11/28/14  9:27 PM  Result Value Ref Range Status   Specimen Description BLOOD RIGHT ANTECUBITAL  Final   Special Requests BOTTLES DRAWN AEROBIC AND ANAEROBIC 5CC  Final   Culture  Setup Time   Final    11/29/2014 03:11 Performed at [facility]    Culture   Final           BLOOD CULTURE RECEIVED NO GROWTH TO DATE CULTURE WILL BE HELD FOR 5 DAYS BEFORE ISSUING A FINAL NEGATIVE REPORT Performed at [facility]    Report Status PENDING  Incomplete  Blood Culture (routine x 2)     Status: None (Preliminary result)   Collection Time: 11/28/14 10:06 PM  Result Value Ref Range Status   Specimen Description BLOOD LEFT ANTECUBITAL  Final   Special Requests BOTTLES DRAWN AEROBIC AND ANAEROBIC 5CC  Final   Culture  Setup Time   Final     11/29/2014 03:11 Performed at [facility]    Culture   Final           BLOOD CULTURE RECEIVED NO GROWTH TO DATE CULTURE WILL BE HELD FOR 5 DAYS BEFORE ISSUING A FINAL NEGATIVE REPORT Performed at [facility]    Report Status PENDING  Incomplete     Studies: No results found.  Scheduled Meds: . celecoxib  100 mg Oral BID  . dexamethasone  8 mg Oral Q12H  . enoxaparin (LOVENOX) injection  40 mg Subcutaneous Q24H  . feeding supplement (ENSURE COMPLETE)  237 mL Oral TID BM  . fentaNYL  25 mcg Transdermal Q72H  . HYDROmorphone PCA 0.3 mg/mL   Intravenous 6 times per day  . nicotine  14 mg Transdermal Daily  . pamidronate (AREDIA) 90 mg IVPB  90 mg Intravenous Once  . polyethylene glycol  17 g Oral Daily  . senna-docusate  2 tablet Oral  QHS   Continuous Infusions:      Total time: 25 minutes    Raaga Maeder  [facility] Pager [phone]. If 7PM-7AM, please contact night-coverage at [email or web address], password [facility] 12/03/2014, 2:47 PM  LOS: 5 days

## 2014-12-03 NOTE — Progress Notes (Signed)
Peripherally Inserted Central Catheter/Midline Placement  The IV Nurse has discussed with the patient and/or persons authorized to consent for the patient, the purpose of this procedure and the potential benefits and risks involved with this procedure.  The benefits include less needle sticks, lab draws from the catheter and patient may be discharged home with the catheter.  Risks include, but not limited to, infection, bleeding, blood clot (thrombus formation), and puncture of an artery; nerve damage and irregular heat beat.  Alternatives to this procedure were also discussed.  PICC/Midline Placement Documentation        Jacob Webb 12/03/2014, 12:56 PM

## 2014-12-03 NOTE — Consult Note (Signed)
Palliative Medicine Team  Consult Note  Active Problems:  Extensive stage small cell lung cancer diagnosed in May of 2014  Spine Mets, s/p WBXRT, multiple courses of chemotherapy  Liver and abdominal mets  Intractable Pain related to metastatic disease  Anxiety  Limited Financial Resources  Hypercalcemia  Dehydration   Met with Jacob Webb and his daughter Jacob Webb. He understands his prognosis and serious nature of his disease progression- I updated Jacob Webb. Grief very appropriate. They have very limited resources. Jacob Webb has three children and is unemployed- she has been applying for disability and has scoliosis-the only income [place] and her children have is from their dad's disability check. She tells me she is worried that they will be homeless once he dies-she also says that it has been awful watching her dad suffer- prior to moving to [place] they were living in [place] and did not have running water. He tells me their current home situation isn't good- limited space and resources.  1. Pain, cancer related:   Place PICC  Start PCA Diladid-determine requirements  Start bisphosphonate  NSAID + Decadron  Hopefully we can control his pain with this regimen. Will be hard to coordinate radiation and allow him to also get hospice care simultaneously given his limited resources.  He is remarkably alert but physically very weak.  Prognosis: <3weeks  I think he would do best in a hospice facility given his difficult to control cancer related pain.  Time: 10:15-11:30 Time; 75 min Greater than 50%  of this time was spent counseling and coordinating care related to the above assessment and plan.  Jacob Hacker, DO Palliative Medicine

## 2014-12-03 NOTE — Progress Notes (Signed)
CSW assisting with d/c planning. [facility] is able to accept pt on Monday. If pt requires placement prior to Monday please contact CSW. [facility] may be able to assist with an earlier admission.  Werner Lean LCSW [phone]

## 2014-12-03 NOTE — Progress Notes (Signed)
Clinical Social Work Department BRIEF PSYCHOSOCIAL ASSESSMENT 12/03/2014  Patient:  TIGER, SPIEKER     Account Number:  [id number]     Admit date:  11/28/2014  Clinical Social Worker:  Lacie Scotts  Date/Time:  12/03/2014 12:42 PM  Referred by:  Physician  Date Referred:  12/03/2014 Referred for  Residential hospice placement   Other Referral:   Interview type:  Patient Other interview type:    PSYCHOSOCIAL DATA Living Status:  FAMILY Admitted from facility:   Level of care:   Primary support name:  Rhae Lerner Primary support relationship to patient:  CHILD, ADULT Degree of support available:   supportive    CURRENT CONCERNS Current Concerns  Post-Acute Placement   Other Concerns:    SOCIAL WORK ASSESSMENT / PLAN Pt is a 63 yr old gentleman admitted from home. Pt / daughter Kian Ottaviano [phone] ) share a home. CSW met with pt / daughter to assist with d/c planning. PN reviewed. Palliative Care Team is involved. GOC held today with pt / daughter. Residential Hospice Home placement has been recommended.CSW met with pt / daughter to offer choice. [facility] has been requested. CSW will contact liaison to make referral and check bed availability.   Assessment/plan status:  Psychosocial Support/Ongoing Assessment of Needs Other assessment/ plan:   Information/referral to community resources:   Residetial Hospice list provided to pt / family.    PATIENT'S/FAMILY'S RESPONSE TO PLAN OF CARE: Pt / daughter agree with plan for residential hospice. They are hopeful that [facility] will be[facility] to assist. CSW encouraged pt / daughter to review residential hospice list for an alternative placement in case [facility] has no availability. They are willing to do this. CSW will report back to pt / daughter once more info is available r/t [facility] placement.    Werner Lean LCSW [phone]

## 2014-12-04 NOTE — Progress Notes (Signed)
O2 sat monitoring and EtCO2 monitoring as started earlier in shift due to PCA -dilaudid discontinued per Dr. Delanna Ahmadi request. Order exists in chart from 12/18 to dc such monitoring per Dr. Hilma Favors. Vitals checked routine with sats 98-100%.

## 2014-12-04 NOTE — Progress Notes (Signed)
[facility] PROGRESS NOTE  Jacob Webb KNL:[id number] DOB: 1951/08/12 DOA: 11/28/2014 PCP: No PCP Per Patient  Brief narrative 63 year old male with metastatic small cell lung cancer who underwent whole brain irradiation as well as palliative radiotherapy to the chest mass, systemic chemotherapy, palliative radiotherapy to abdominal metastases, left hip followed by systemic chemotherapy which was completed in October 2015. Patient presented with intractable back pain with findings of extensive lumbar spinal metastases with compression fracture of L3. Patient placed on empiric Decadron and pain control. Patient seen by his oncologist today who recommends that given significant disease progression and physical deconditioning he is not a candidate for any further treatment and recommend palliative care and hospice.   Assessment/Plan: Extensive small cell lung cancer with diffuse metastases Patient has abdominal, pelvis and extensive lumbar spine metastases.  significant low back pain not completely controlled on fentanyl patch and IV and oral Dilaudid.  -Seen by his oncologist and given extensive disease progression recommended hospice.  -After discussion with palliative care consult patient made a DO NOT RESUSCITATE with plan for hospice. Started on Dilaudid PCA for adequate pain control. Added Decadron and Celebrex to further alleviate pain . Patient feels much better today with better pain control. -PICC line placed. -Referred to the inpatient hospice made. Patient accepted at [facility] to be discharged on 12/21.  Anemia Stable  Hypercalcemia Secondary to malignancy. Improved with hydration.  - Hydronephrosis Secondary to retroperitoneal lesions. Moderate to severe hydronephrosis was noted on prior imaging as well. Renal function is currently stable.  Protein calorie malnutrition Continue supplements    DVT prophylaxis Subcutaneous Lovenox  Diet: Regular  Code Status:  DNR Family Communication: Spoke with Daughter on 12/18  Disposition Plan: Residential hospice on 12/21  Consultants:  Oncology  Palliative care  Procedures:  None  Antibiotics:  None  HPI/Subjective: Patient seen and examined. Feels much comfortable with Dilaudid PCA Objective: Filed Vitals:   12/04/14 1400  BP: 129/95  Pulse: 106  Temp: 98.3 F (36.8 C)  Resp: 20    Intake/Output Summary (Last 24 hours) at 12/04/14 1533 Last data filed at 12/04/14 1400  Gross per 24 hour  Intake    600 ml  Output    950 ml  Net   -350 ml   Filed Weights   12/02/14 0527 12/03/14 0441 12/04/14 0449  Weight: 51.12 kg (112 lb 11.2 oz) 51.1 kg (112 lb 10.5 oz) 51 kg (112 lb 7 oz)    Exam:   General:  in NAD  HEENT:  moist mucosa  Cardiovascular: NS1&S2, no murmurs  Respiratory: clear b/l, no added sounds  Abdomen: soft , NT, ND,  Musculoskeletal:Warm, no edema,   Data Reviewed: Basic Metabolic Panel:  Recent Labs Lab 11/28/14 2103 11/29/14 0942 12/01/14 0456  NA 136* 134* 135*  K 4.7 4.6 4.3  CL 95* 95* 95*  CO2 26 30 27   GLUCOSE 86 106* 118*  BUN 29* 24* 13  CREATININE 0.99 1.05 0.99  CALCIUM 10.9* 10.3 10.2  MG  --  1.5  --   PHOS  --  3.0  --    Liver Function Tests:  Recent Labs Lab 11/28/14 2103 11/29/14 0942  AST 39* 36  ALT 32 26  ALKPHOS 216* 209*  BILITOT 0.6 0.5  PROT 8.4* 7.6  ALBUMIN 3.3* 3.1*   No results for input(s): LIPASE, AMYLASE in the last 168 hours. No results for input(s): AMMONIA in the last 168 hours. CBC:  Recent Labs Lab 11/28/14 2103 11/29/14  0942  WBC 9.9 8.2  NEUTROABS 7.7 6.5  HGB 10.3* 9.5*  HCT 33.2* 30.5*  MCV 86.2 85.9  PLT 169 148*   Cardiac Enzymes: No results for input(s): CKTOTAL, CKMB, CKMBINDEX, TROPONINI in the last 168 hours. BNP (last 3 results) No results for input(s): PROBNP in the last 8760 hours. CBG: No results for input(s): GLUCAP in the last 168 hours.  Recent Results (from the  past 240 hour(s))  Urine culture     Status: None   Collection Time: 11/28/14  9:27 PM  Result Value Ref Range Status   Specimen Description URINE, RANDOM  Final   Special Requests Immunocompromised  Final   Culture  Setup Time   Final    11/29/2014 03:28 Performed at [place] Performed at [facility]   Final   Culture NO GROWTH Performed at [facility]   Final   Report Status 11/30/2014 FINAL  Final  Blood Culture (routine x 2)     Status: None (Preliminary result)   Collection Time: 11/28/14  9:27 PM  Result Value Ref Range Status   Specimen Description BLOOD RIGHT ANTECUBITAL  Final   Special Requests BOTTLES DRAWN AEROBIC AND ANAEROBIC 5CC  Final   Culture  Setup Time   Final    11/29/2014 03:11 Performed at [facility]    Culture   Final           BLOOD CULTURE RECEIVED NO GROWTH TO DATE CULTURE WILL BE HELD FOR 5 DAYS BEFORE ISSUING A FINAL NEGATIVE REPORT Performed at [facility]    Report Status PENDING  Incomplete  Blood Culture (routine x 2)     Status: None (Preliminary result)   Collection Time: 11/28/14 10:06 PM  Result Value Ref Range Status   Specimen Description BLOOD LEFT ANTECUBITAL  Final   Special Requests BOTTLES DRAWN AEROBIC AND ANAEROBIC 5CC  Final   Culture  Setup Time   Final    11/29/2014 03:11 Performed at [facility]    Culture   Final           BLOOD CULTURE RECEIVED NO GROWTH TO DATE CULTURE WILL BE HELD FOR 5 DAYS BEFORE ISSUING A FINAL NEGATIVE REPORT Performed at [facility]    Report Status PENDING  Incomplete     Studies: No results found.  Scheduled Meds: . celecoxib  100 mg Oral BID  . dexamethasone  8 mg Oral Q12H  . enoxaparin (LOVENOX) injection  40 mg Subcutaneous Q24H  . feeding supplement (ENSURE COMPLETE)  237 mL Oral TID BM  . fentaNYL  25 mcg Transdermal Q72H  . HYDROmorphone PCA 0.3 mg/mL   Intravenous 6 times per day  .  nicotine  14 mg Transdermal Daily  . polyethylene glycol  17 g Oral Daily  . senna-docusate  2 tablet Oral QHS   Continuous Infusions:      Total time: 25 minutes    Tonae Livolsi  [facility] Pager [phone]. If 7PM-7AM, please contact night-coverage at [email or web address], password [facility] 12/04/2014, 3:33 PM  LOS: 6 days

## 2014-12-05 LAB — CULTURE, BLOOD (ROUTINE X 2)
Culture: NO GROWTH
Culture: NO GROWTH

## 2014-12-05 LAB — PTH-RELATED PEPTIDE: PTH-related peptide: 12 pg/mL — ABNORMAL LOW (ref 14–27)

## 2014-12-05 NOTE — Progress Notes (Signed)
[facility] PROGRESS NOTE  Jacob Webb PPJ:[id number] DOB: April 10, 1951 DOA: 11/28/2014 PCP: No PCP Per Patient  Brief narrative 63 year old male with metastatic small cell lung cancer who underwent whole brain irradiation as well as palliative radiotherapy to the chest mass, systemic chemotherapy, palliative radiotherapy to abdominal metastases, left hip followed by systemic chemotherapy which was completed in October 2015. Patient presented with intractable back pain with findings of extensive lumbar spinal metastases with compression fracture of L3. Patient placed on empiric Decadron and pain control. Patient seen by his oncologist today who recommends that given significant disease progression and physical deconditioning he is not a candidate for any further treatment and recommend palliative care and hospice.   Assessment/Plan: Extensive small cell lung cancer with diffuse metastases abdominal, pelvis and extensive lumbar spine metastases. Has significant low back pain  -Seen by his oncologist and given extensive disease progression recommends  palliative care for goals of care discussion and possibly hospice. Radiation oncology evaluated patient for palliative radiotherapy. Palliative care consult met with patient and his daughter Shari Heritage. . Patient and his daughter understand his poor prognosis. He is now DO NOT RESUSCITATE and referred for residential hospice. - PICC line placed and started Dilaudid PCA. Ordered bisphosphonate . Increase Decadron to 8 mg twice daily and added Celebrex for better pain control. -. Has been accepted to [facility] for residential hospice and can be discharged on 12/21. Will d/w palliative care in am for suitable pain regimen.    Anemia Stable  Hypercalcemia Secondary to malignancy. Improved with hydration.  - Hydronephrosis Secondary to retroperitoneal lesions. Moderate to severe hydronephrosis was noted on prior imaging as well. Renal function  is currently stable.  Protein calorie malnutrition Continue supplements    DVT prophylaxis Subcutaneous Lovenox  Diet: Regular  Code Status: FULL CODE Family Communication: brothers at bedside who came to visit him from [place]: Residential hospice on 12/21  Consultants:  Oncology  Palliative care  Procedures:  None  Antibiotics:  None  HPI/Subjective: Patient seen and examined.reports having back pains controlled with PCA Objective: Filed Vitals:   12/05/14 1401  BP: 115/81  Pulse: 90  Temp: 98.2 F (36.8 C)  Resp: 18    Intake/Output Summary (Last 24 hours) at 12/05/14 1524 Last data filed at 12/05/14 1401  Gross per 24 hour  Intake    300 ml  Output   1075 ml  Net   -775 ml   Filed Weights   12/02/14 0527 12/03/14 0441 12/04/14 0449  Weight: 51.12 kg (112 lb 11.2 oz) 51.1 kg (112 lb 10.5 oz) 51 kg (112 lb 7 oz)    Exam:   General: Middle aged thin built male in NAD  HEENT: moist mucosa  Cardiovascular: NS1&S2,  Respiratory: clear b/l, no added sounds  Abdomen: soft , NT, ND,  Musculoskeletal:, low back tenderness  Data Reviewed: Basic Metabolic Panel:  Recent Labs Lab 11/28/14 2103 11/29/14 0942 12/01/14 0456  NA 136* 134* 135*  K 4.7 4.6 4.3  CL 95* 95* 95*  CO2 26 30 27   GLUCOSE 86 106* 118*  BUN 29* 24* 13  CREATININE 0.99 1.05 0.99  CALCIUM 10.9* 10.3 10.2  MG  --  1.5  --   PHOS  --  3.0  --    Liver Function Tests:  Recent Labs Lab 11/28/14 2103 11/29/14 0942  AST 39* 36  ALT 32 26  ALKPHOS 216* 209*  BILITOT 0.6 0.5  PROT 8.4* 7.6  ALBUMIN 3.3* 3.1*  No results for input(s): LIPASE, AMYLASE in the last 168 hours. No results for input(s): AMMONIA in the last 168 hours. CBC:  Recent Labs Lab 11/28/14 2103 11/29/14 0942  WBC 9.9 8.2  NEUTROABS 7.7 6.5  HGB 10.3* 9.5*  HCT 33.2* 30.5*  MCV 86.2 85.9  PLT 169 148*   Cardiac Enzymes: No results for input(s): CKTOTAL, CKMB,  CKMBINDEX, TROPONINI in the last 168 hours. BNP (last 3 results) No results for input(s): PROBNP in the last 8760 hours. CBG: No results for input(s): GLUCAP in the last 168 hours.  Recent Results (from the past 240 hour(s))  Urine culture     Status: None   Collection Time: 11/28/14  9:27 PM  Result Value Ref Range Status   Specimen Description URINE, RANDOM  Final   Special Requests Immunocompromised  Final   Culture  Setup Time   Final    11/29/2014 03:28 Performed at [place] Performed at [facility]   Final   Culture NO GROWTH Performed at [facility]   Final   Report Status 11/30/2014 FINAL  Final  Blood Culture (routine x 2)     Status: None (Preliminary result)   Collection Time: 11/28/14  9:27 PM  Result Value Ref Range Status   Specimen Description BLOOD RIGHT ANTECUBITAL  Final   Special Requests BOTTLES DRAWN AEROBIC AND ANAEROBIC 5CC  Final   Culture  Setup Time   Final    11/29/2014 03:11 Performed at [facility]    Culture   Final           BLOOD CULTURE RECEIVED NO GROWTH TO DATE CULTURE WILL BE HELD FOR 5 DAYS BEFORE ISSUING A FINAL NEGATIVE REPORT Performed at [facility]    Report Status PENDING  Incomplete  Blood Culture (routine x 2)     Status: None (Preliminary result)   Collection Time: 11/28/14 10:06 PM  Result Value Ref Range Status   Specimen Description BLOOD LEFT ANTECUBITAL  Final   Special Requests BOTTLES DRAWN AEROBIC AND ANAEROBIC 5CC  Final   Culture  Setup Time   Final    11/29/2014 03:11 Performed at [facility]    Culture   Final           BLOOD CULTURE RECEIVED NO GROWTH TO DATE CULTURE WILL BE HELD FOR 5 DAYS BEFORE ISSUING A FINAL NEGATIVE REPORT Performed at [facility]    Report Status PENDING  Incomplete     Studies: No results found.  Scheduled Meds: . celecoxib  100 mg Oral BID  . dexamethasone  8 mg Oral Q12H  .  enoxaparin (LOVENOX) injection  40 mg Subcutaneous Q24H  . feeding supplement (ENSURE COMPLETE)  237 mL Oral TID BM  . fentaNYL  25 mcg Transdermal Q72H  . HYDROmorphone PCA 0.3 mg/mL   Intravenous 6 times per day  . nicotine  14 mg Transdermal Daily  . polyethylene glycol  17 g Oral Daily  . senna-docusate  2 tablet Oral QHS   Continuous Infusions:     Time spent: 25 minutes    Sadey Yandell, [place]  [facility] Pager [phone]. If 7PM-7AM, please contact night-coverage at [email or web address], password [facility] 12/05/2014, 3:24 PM  LOS: 7 days

## 2014-12-06 DIAGNOSIS — E43 Unspecified severe protein-calorie malnutrition: Secondary | ICD-10-CM

## 2014-12-06 DIAGNOSIS — C3490 Malignant neoplasm of unspecified part of unspecified bronchus or lung: Secondary | ICD-10-CM | POA: Diagnosis present

## 2014-12-06 LAB — CREATININE, SERUM
CREATININE: 0.85 mg/dL (ref 0.50–1.35)
GFR calc Af Amer: >90 mL/min (ref >90)
GFR calc non Af Amer: >90 mL/min (ref >90)

## 2014-12-06 MED ORDER — DEXAMETHASONE 4 MG PO TABS: 8.0000 mg | ORAL_TABLET | Freq: Two times a day (BID) | ORAL | Status: AC

## 2014-12-06 MED ORDER — ENSURE COMPLETE PO LIQD: 237.0000 mL | Freq: Three times a day (TID) | ORAL | Status: AC

## 2014-12-06 MED ORDER — NICOTINE 14 MG/24HR TD PT24: 14.0000 mg | MEDICATED_PATCH | Freq: Every day | TRANSDERMAL | Status: AC

## 2014-12-06 MED ORDER — HYDROMORPHONE 0.3 MG/ML IV SOLN: 4.0000 mg | INTRAVENOUS | Status: AC

## 2014-12-06 MED ORDER — CELECOXIB 100 MG PO CAPS: 100.0000 mg | ORAL_CAPSULE | Freq: Two times a day (BID) | ORAL | Status: AC

## 2014-12-06 NOTE — Clinical Social Work Note (Signed)
Patient for d/c today to [facility]. Patient agreeable to plans- he reports his daughter is aware and has gone over to meet with staff. Patient agreeable to this plan- plan transfer via EMS. Eduard Clos, MSW, [place]

## 2014-12-06 NOTE — Progress Notes (Signed)
I have followed the patient and have discussed possible palliative radiation treatment with him. With the patient transitioning to [facility], which I believe is very reasonable, we will forego any radiation treatment planning. Please contact me if I can be of any further assistance.

## 2014-12-06 NOTE — Consult Note (Signed)
[facility] Liaison: [employer] available for "Max" today. Met with patient and daughter Shari Heritage 12/04/14 to answer questions and confirm interest. Both relieved room available in [place] due to most family in [place] area. Dr. Orpah Melter to assume care per patient choice. Please fax discharge summary to [phone]. RN please call report to [phone]. Please arrange transport for patient to arrive by noon if possible. Have spoken with CSW. Thank you. Erling Conte LCSW [phone]

## 2014-12-06 NOTE — Progress Notes (Signed)
12/06/14  1220  Called report to [facility] at [phone]

## 2014-12-06 NOTE — Clinical Social Work Note (Signed)
[facility] has bed for patient today and I have advised Dr.Dhungel and awaiting word from MD.  Eduard Clos, MSW, [place]

## 2014-12-06 NOTE — Progress Notes (Signed)
12/06/14 Patient discharged to [facility] with single lumen PICC. PICC flushed. Per MD order.

## 2014-12-06 NOTE — Discharge Summary (Signed)
Physician Discharge Summary  Jacob Webb IWP:[id number] DOB: Apr 20, 1951 DOA: 11/28/2014  PCP: No PCP Per Patient  Admit date: 11/28/2014 Discharge date: 12/06/2014  Time spent: 35 minutes  Recommendations for Outpatient Follow-up:  1. D/c ho [facility] for residential hospice  Discharge Diagnoses:  Principal Problem:   Metastatic lung cancer (metastasis from lung to other site)  Active Problems:   Small cell lung cancer   Secondary malignant neoplasm of bone and bone marrow   Back pain   Anemia   Hypercalcemia   Lumbago   DNR (do not resuscitate)   Severe protein-calorie malnutrition   Discharge Condition: poor / guarded  Diet recommendation: regular  Code Status: DNR/ DNI  Filed Weights   12/02/14 0527 12/03/14 0441 12/04/14 0449  Weight: 51.12 kg (112 lb 11.2 oz) 51.1 kg (112 lb 10.5 oz) 51 kg (112 lb 7 oz)    History of present illness:  63 year old male with metastatic small cell lung cancer who underwent whole brain irradiation as well as palliative radiotherapy to the chest mass, systemic chemotherapy, palliative radiotherapy to abdominal metastases, left hip followed by systemic chemotherapy which was completed in October 2015. Patient presented with intractable back pain with findings of extensive lumbar spinal metastases with compression fracture of L3. Patient placed on empiric Decadron and pain control. Patient seen by his oncologist today who recommends that given significant disease progression and physical deconditioning he is not a candidate for any further treatment and recommend palliative care and hospice.  Hospital Course:   Extensive small cell lung cancer with diffuse metastases abdominal, pelvis and extensive lumbar spine metastases. Has significant low back pain  -Seen by his oncologist and given extensive disease progression recommends palliative care for goals of care discussion and possibly hospice. Radiation oncology evaluated patient  for palliative radiotherapy. Palliative care consult met with patient and his daughter Shari Heritage. .  -Patient and his daughter understand his poor prognosis. He is now DO NOT RESUSCITATE and referred for residential hospice. - PICC line placed and started Dilaudid PCA. Ordered bisphosphonate . Increase Decadron to 8 mg twice daily and added Celebrex for better pain control. Patient pain is much controlled at this time.  -Patient accepted at [facility] for residential hospice. Will discharge on PCA and other meds.  Pain medication weill be adjusted at [facility].     Anemia Stable  Hypercalcemia Secondary to malignancy. Improved with hydration.  - Hydronephrosis Secondary to retroperitoneal lesions. Moderate to severe hydronephrosis was noted on prior imaging as well. Renal function is currently stable.  Protein calorie malnutrition Continue supplements     Diet: Regular   Family Communication: spoke with daughter and brothers while in the hospital Disposition Plan: Residential hospice   Consultants:  Oncology  Palliative care  Procedures:  None  Antibiotics:  None   Discharge Exam: Filed Vitals:   12/06/14 1148  BP:   Pulse:   Temp:   Resp: 16     General: Middle aged thin built male in NAD  HEENT: pallor+, oist mucosa  Cardiovascular: NS1&S2,  Respiratory: clear b/l, no added sounds  Abdomen: soft , NT, ND,  Musculoskeletal:, low back tenderness  Discharge Instructions    Current Discharge Medication List    START taking these medications   Details  celecoxib (CELEBREX) 100 MG capsule Take 1 capsule (100 mg total) by mouth 2 (two) times daily. Qty: 30 capsule, Refills: 0    dexamethasone (DECADRON) 4 MG tablet Take 2 tablets (8 mg total) by mouth  every 12 (twelve) hours. Qty: 30 tablet, Refills: 0    feeding supplement, ENSURE COMPLETE, (ENSURE COMPLETE) LIQD Take 237 mLs by mouth 3 (three) times daily between meals. Qty: 90 Bottle,  Refills: 0    HYDROmorphone PCA 0.3 mg/mL (DILAUDID) 0.3 mg/mL SOLN Inject 13.33 mLs (4 mg total) into the vein every 4 (four) hours. Qty: 10 mL, Refills: 0    nicotine (NICODERM CQ - DOSED IN MG/24 HOURS) 14 mg/24hr patch Place 1 patch (14 mg total) onto the skin daily. Qty: 28 patch, Refills: 0      CONTINUE these medications which have NOT CHANGED   Details  docusate sodium (COLACE) 100 MG capsule Take 1 capsule (100 mg total) by mouth 2 (two) times daily. Qty: 10 capsule, Refills: 0    polyethylene glycol (MIRALAX / GLYCOLAX) packet Take 17 g by mouth daily. Qty: 14 each, Refills: 0    ondansetron (ZOFRAN-ODT) 8 MG disintegrating tablet Take 8 mg by mouth every 8 (eight) hours as needed for nausea or vomiting.    promethazine (PHENERGAN) 25 MG tablet Take 1 tablet (25 mg total) by mouth every 6 (six) hours as needed for nausea or vomiting. Qty: 30 tablet, Refills: 0   Associated Diagnoses: Malignant neoplasm of lung, unspecified laterality, unspecified part of lung      STOP taking these medications     HYDROmorphone (DILAUDID) 2 MG tablet      ibuprofen (ADVIL,MOTRIN) 200 MG tablet        Allergies  Allergen Reactions  . Tylenol [Acetaminophen]     Was told not to take tylenol   Follow-up Information    Please follow up.   Why:  at [facility]       The results of significant diagnostics from this hospitalization (including imaging, microbiology, ancillary and laboratory) are listed below for reference.    Significant Diagnostic Studies: Dg Chest 2 View  11/28/2014   CLINICAL DATA:  Upper back pain for 2 days  EXAM: CHEST  2 VIEW  COMPARISON:  10/15/2014  FINDINGS: The cardiac shadow is within normal limits. The lungs are clear bilaterally. Nipple shadows are seen bilaterally. There are changes consistent with prior radiation in the left medial lung apex which are stable from the prior exam. No acute bony abnormality is noted.  IMPRESSION: Chronic changes  without acute abnormality.   Electronically Signed   By: Inez Catalina M.D.   On: 11/28/2014 20:47   Dg Thoracic Spine 2 View  11/28/2014   CLINICAL DATA:  Upper back pain, initial encounter  EXAM: THORACIC SPINE - 2 VIEW  COMPARISON:  None.  FINDINGS: There is no evidence of thoracic spine fracture. Alignment is normal. No other significant bone abnormalities are identified.  IMPRESSION: No acute abnormality noted.   Electronically Signed   By: Inez Catalina M.D.   On: 11/28/2014 20:48   Dg Lumbar Spine Complete  11/28/2014   CLINICAL DATA:  Low back pain, initial encounter  EXAM: LUMBAR SPINE - COMPLETE 4+ VIEW  COMPARISON:  11/18/2014, 10/11/2014  FINDINGS: Bilateral L5 pars defects are noted. Mildly anterolisthesis is noted of L5 on S1. No acute compression deformity is seen. The metastatic changes in the L3 vertebral body are not well appreciated on this exam. No soft tissue abnormality is seen. Diffuse aortic calcifications are noted.  IMPRESSION: Chronic changes, no acute abnormality is noted.   Electronically Signed   By: Inez Catalina M.D.   On: 11/28/2014 20:51   Dg Lumbar Spine  Complete  11/18/2014   CLINICAL DATA:  Status post fall, trauma  EXAM: LUMBAR SPINE - COMPLETE 4+ VIEW  COMPARISON:  None.  FINDINGS: There is no evidence of lumbar spine fracture. Alignment is normal. Intervertebral disc spaces are maintained.  There is abdominal aortic atherosclerosis.  IMPRESSION: No acute osseous injury of the lumbar spine.   Electronically Signed   By: Kathreen Devoid   On: 11/18/2014 17:23   Ct Head Wo Contrast  11/28/2014   CLINICAL DATA:  Recent fall.  No loss of consciousness.  EXAM: CT HEAD WITHOUT CONTRAST  CT CERVICAL SPINE WITHOUT CONTRAST  TECHNIQUE: Multidetector CT imaging of the head and cervical spine was performed following the standard protocol without intravenous contrast. Multiplanar CT image reconstructions of the cervical spine were also generated.  COMPARISON:  CT scan of head of  September 11, 2013. MRI of September 01, 2014.  FINDINGS: CT HEAD FINDINGS  Bony calvarium appears intact. Moderate chronic ischemic white matter disease is noted. No mass effect or midline shift is noted. Ventricular size is within normal limits. There is no evidence of mass lesion, hemorrhage or acute infarction.  CT CERVICAL SPINE FINDINGS  No fracture or spondylolisthesis is noted. Mild anterior osteophyte formation is noted at C4-5, C5-6 and C6-7. Posterior facet joints appear normal.  IMPRESSION: Moderate diffuse cortical atrophy. No gross intracranial abnormality seen.  Mild degenerative changes as described above. No acute abnormality seen in the cervical spine.   Electronically Signed   By: Sabino Dick M.D.   On: 11/28/2014 20:34   Ct Cervical Spine Wo Contrast  11/28/2014   CLINICAL DATA:  Recent fall.  No loss of consciousness.  EXAM: CT HEAD WITHOUT CONTRAST  CT CERVICAL SPINE WITHOUT CONTRAST  TECHNIQUE: Multidetector CT imaging of the head and cervical spine was performed following the standard protocol without intravenous contrast. Multiplanar CT image reconstructions of the cervical spine were also generated.  COMPARISON:  CT scan of head of September 11, 2013. MRI of September 01, 2014.  FINDINGS: CT HEAD FINDINGS  Bony calvarium appears intact. Moderate chronic ischemic white matter disease is noted. No mass effect or midline shift is noted. Ventricular size is within normal limits. There is no evidence of mass lesion, hemorrhage or acute infarction.  CT CERVICAL SPINE FINDINGS  No fracture or spondylolisthesis is noted. Mild anterior osteophyte formation is noted at C4-5, C5-6 and C6-7. Posterior facet joints appear normal.  IMPRESSION: Moderate diffuse cortical atrophy. No gross intracranial abnormality seen.  Mild degenerative changes as described above. No acute abnormality seen in the cervical spine.   Electronically Signed   By: Sabino Dick M.D.   On: 11/28/2014 20:34   Mr Lumbar  Spine W Wo Contrast  11/30/2014   CLINICAL DATA:  63 year old male with metastatic lung cancer. Severe Low back pain with sciatica, laterality on specified. Restaging. Subsequent encounter.  EXAM: MRI LUMBAR SPINE WITHOUT AND WITH CONTRAST  TECHNIQUE: Multiplanar and multiecho pulse sequences of the lumbar spine were obtained without and with intravenous contrast.  CONTRAST:  23m MULTIHANCE GADOBENATE DIMEGLUMINE 529 MG/ML IV SOLN  COMPARISON:  CT Abdomen and Pelvis 11/29/2014 and earlier. No prior lumbar MRI.  FINDINGS: Normal lumbar segmentation confirmed on comparison. Widespread bony metastatic disease in the visualized spine. Subtotal tumor replacement of marrowL3 and S2 spinal levels. Perhaps mild loss of L3 vertebral body height. Early epidural tumor extension in the L3 right ventral epidural space and tracking into the right L3 neural foramen (series 8, images  32 and 33 and series 9, image 7. No other lumbar spine epidural or foraminal extension of tumor. There is mild bilateral as 3 foraminal extension of soft tissue tumor.  Scattered bone metastases up to 2 cm in size elsewhere. No levels are spared T11 through S3. Confluent involvement of the visualized medial iliac bones.  Visualized lower thoracic spinal cord is normal with conus medularis at L1. No abnormal intradural enhancement. Grossly normal cauda equina nerve roots.  Widespread abnormal increased T2 and STIR signal in the erector spinae muscles, no discrete posterior paraspinal muscle metastasis.  Superimposed degenerative L5-S1 spondylolisthesis and chronic spondylolysis.  Numerous liver metastases. Right para per renal metastases re - identified, including bad obstructing the proximal right ureter.  IMPRESSION: 1. Widespread osseous metastatic disease in the visualized spine and pelvis. 2. Mild pathologic compression fracture of L3 with early epidural tumor extension involving the exiting right L3 nerve. Mild bilateral S3 neural foraminal  tumor. 3. Widespread liver and right para renal metastases re- identified. 4. No lower spinal cord or cauda equina metastasis identified. 5. 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   Electronically Signed   By: Lars Pinks M.D.   On: 11/30/2014 09:55   Ct Abdomen Pelvis W Contrast  11/29/2014   CLINICAL DATA:  Current history of metastatic lung cancer. Low back pain.  EXAM: CT ABDOMEN AND PELVIS WITH CONTRAST  TECHNIQUE: Multidetector CT imaging of the abdomen and pelvis was performed using the standard protocol following bolus administration of intravenous contrast.  CONTRAST:  166m OMNIPAQUE IOHEXOL 300 MG/ML  SOLN  COMPARISON:  CT scan of October 11, 2014.  FINDINGS: Increased lucency is noted throughout the L3 vertebral body with disruption of the posterior cortex consistent with metastatic disease. Increased right iliac metastatic lesion is noted. Bilateral pars defects are seen at L5. Visualized lung bases appear normal.  No gallstones are noted. Diffuse rounded low densities are noted throughout the hepatic parenchyma which are significantly increased compared to prior exam consistent with metastatic disease. The spleen and pancreas appear normal. Left adrenal gland and kidney appear normal. Right adrenal mass has significantly enlarged, now measuring 3.1 x 2.6 cm. Severe right hydronephrosis is noted due to mass involving and obstructing the proximal right ureter. This mass measures 2.9 x 2.2 cm. It extends along the proximal portion of the right ureter. 3.3 x 3.1 cm mass arises posteriorly from the upper pole of right kidney which is increased compared to prior exam. The appendix appears normal. Stool is noted in the right colon. No abnormal fluid collection is noted. Atherosclerotic calcifications of abdominal aorta are noted without aneurysm formation. Urinary bladder appears normal. There is no evidence of bowel obstruction. No abnormal fluid collection is noted. Urinary  bladder appears normal. No significant adenopathy is noted.  IMPRESSION: Overall, there is significant worsening of metastatic disease seen throughout the abdomen and pelvis. Diffuse hepatic metastases are noted which are increased in number in size compared to prior exam.  Right adrenal and renal masses are also significantly increased compared to prior exam.  Increased right retroperitoneal mass is noted which surrounds the proximal right ureter and results in severe right hydronephrosis.  Worsening metastatic lesions are noted in the right iliac bone and L3 vertebral body.   Electronically Signed   By: JSabino DickM.D.   On: 11/29/2014 00:40    Microbiology: Recent Results (from the past 240 hour(s))  Urine culture     Status: None   Collection Time: 11/28/14  9:27  PM  Result Value Ref Range Status   Specimen Description URINE, RANDOM  Final   Special Requests Immunocompromised  Final   Culture  Setup Time   Final    11/29/2014 03:28 Performed at [place] Performed at [facility]   Final   Culture NO GROWTH Performed at [facility]   Final   Report Status 11/30/2014 FINAL  Final  Blood Culture (routine x 2)     Status: None   Collection Time: 11/28/14  9:27 PM  Result Value Ref Range Status   Specimen Description BLOOD RIGHT ANTECUBITAL  Final   Special Requests BOTTLES DRAWN AEROBIC AND ANAEROBIC 5CC  Final   Culture  Setup Time   Final    11/29/2014 03:11 Performed at [facility]    Culture   Final    NO GROWTH 5 DAYS Performed at [facility]    Report Status 12/05/2014 FINAL  Final  Blood Culture (routine x 2)     Status: None   Collection Time: 11/28/14 10:06 PM  Result Value Ref Range Status   Specimen Description BLOOD LEFT ANTECUBITAL  Final   Special Requests BOTTLES DRAWN AEROBIC AND ANAEROBIC 5CC  Final   Culture  Setup Time   Final    11/29/2014 03:11 Performed at [facility]     Culture   Final    NO GROWTH 5 DAYS Performed at [facility]    Report Status 12/05/2014 FINAL  Final     Labs: Basic Metabolic Panel:  Recent Labs Lab 12/01/14 0456 12/06/14 0445  NA 135*  --   K 4.3  --   CL 95*  --   CO2 27  --   GLUCOSE 118*  --   BUN 13  --   CREATININE 0.99 0.85  CALCIUM 10.2  --    Liver Function Tests: No results for input(s): AST, ALT, ALKPHOS, BILITOT, PROT, ALBUMIN in the last 168 hours. No results for input(s): LIPASE, AMYLASE in the last 168 hours. No results for input(s): AMMONIA in the last 168 hours. CBC: No results for input(s): WBC, NEUTROABS, HGB, HCT, MCV, PLT in the last 168 hours. Cardiac Enzymes: No results for input(s): CKTOTAL, CKMB, CKMBINDEX, TROPONINI in the last 168 hours. BNP: BNP (last 3 results) No results for input(s): PROBNP in the last 8760 hours. CBG: No results for input(s): GLUCAP in the last 168 hours.     SignedLouellen Molder  [facility] 12/06/2014, 11:55 AM

## 2014-12-17 DEATH — deceased

## 2014-12-20 ENCOUNTER — Ambulatory Visit (HOSPITAL_COMMUNITY): Admission: RE | Admit: 2014-12-20 | Payer: Medicaid Other | Source: Ambulatory Visit

## 2014-12-20 ENCOUNTER — Other Ambulatory Visit: Payer: Medicaid Other

## 2014-12-27 ENCOUNTER — Ambulatory Visit: Payer: Medicaid Other | Admitting: Internal Medicine

## 2015-01-15 IMAGING — CT CT ABD-PELV W/O CM
2 of 4 series · 14 of 36 positions shown, 17 images · IV contrast (omnipaque)
Comparison: CT chest 07/05/2014, CT abdomen pelvis from 08/03/2014.

CLINICAL DATA: Metastatic lung cancer

EXAM:
CT CHEST, ABDOMEN, AND PELVIS WITH CONTRAST
TECHNIQUE: Multidetector CT imaging of the chest, abdomen and pelvis was
performed following the standard protocol during bolus
administration of intravenous contrast.
CONTRAST:  50mL OMNIPAQUE IOHEXOL 300 MG/ML  SOLN

[Series 2: cap w/o w/o st · axial · non-contrast · 0.84mm/px · z∈[-637,-32]mm · 11 of 137 slices shown, 14 images]
[im 8/137  mediastinal]
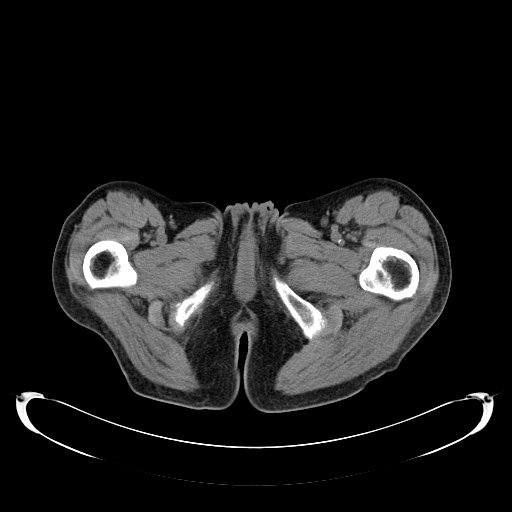
[im 8/137  lung]
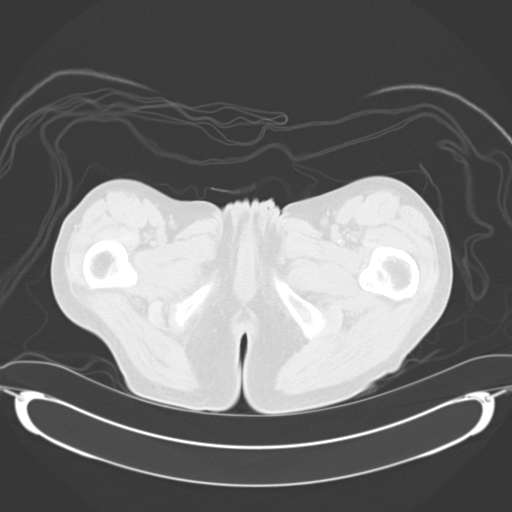
[im 23/137  lung]
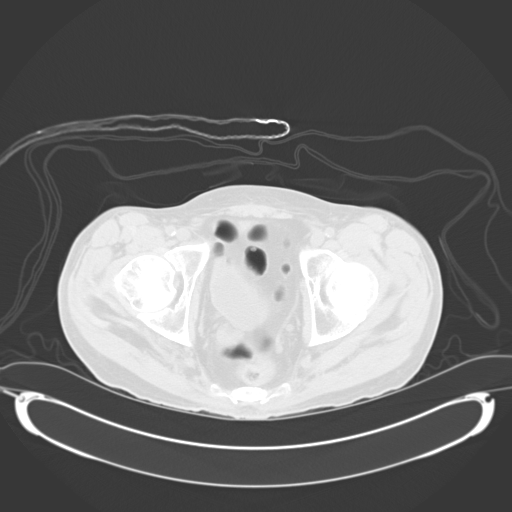
[im 31/137  lung]
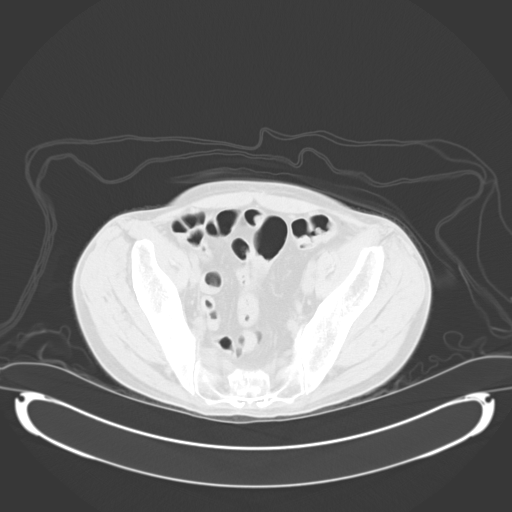
[im 46/137  lung]
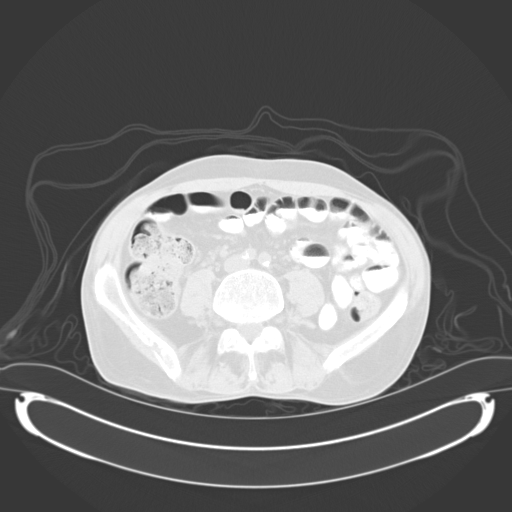
[im 53/137  mediastinal]
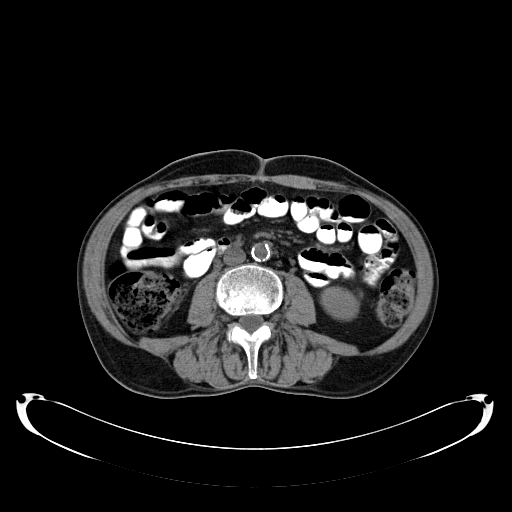
[im 53/137  lung]
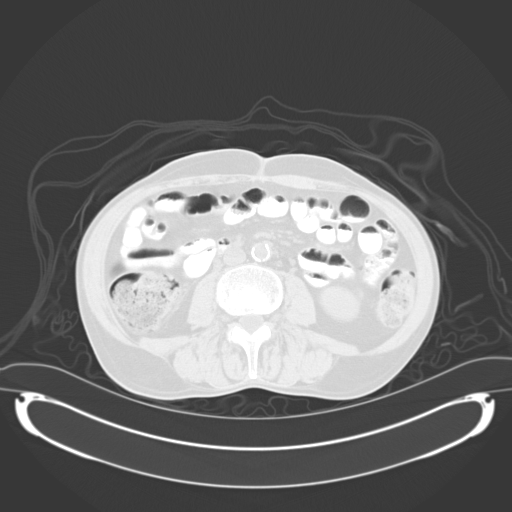
[im 69/137  lung]
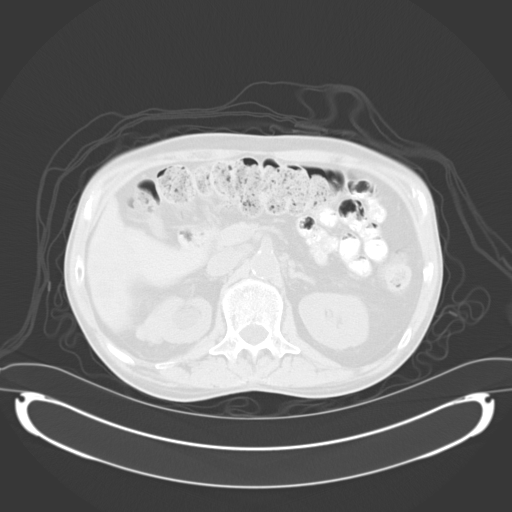
[im 84/137  lung]
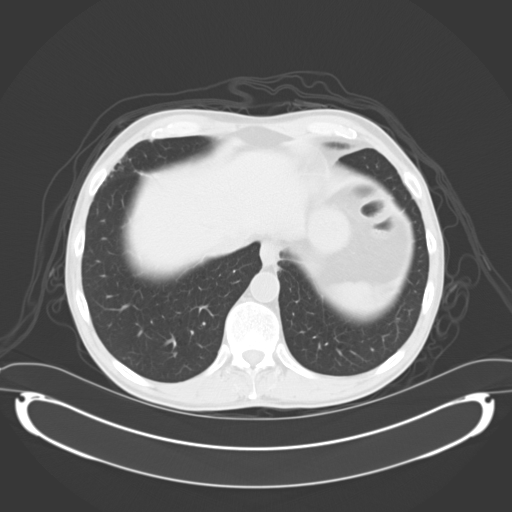
[im 91/137  lung]
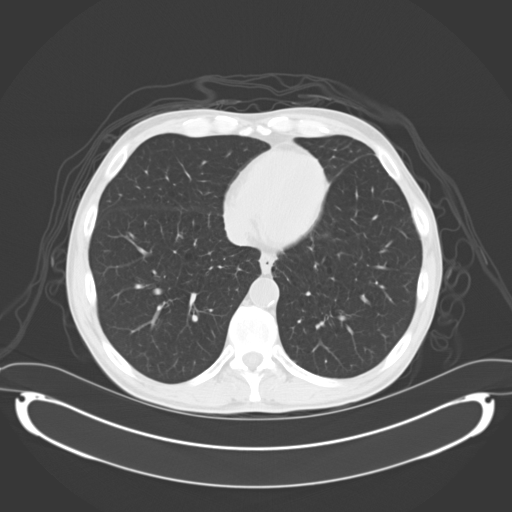
[im 106/137  mediastinal]
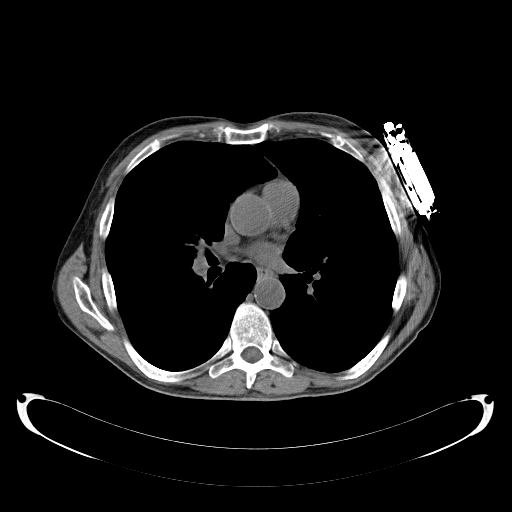
[im 106/137  lung]
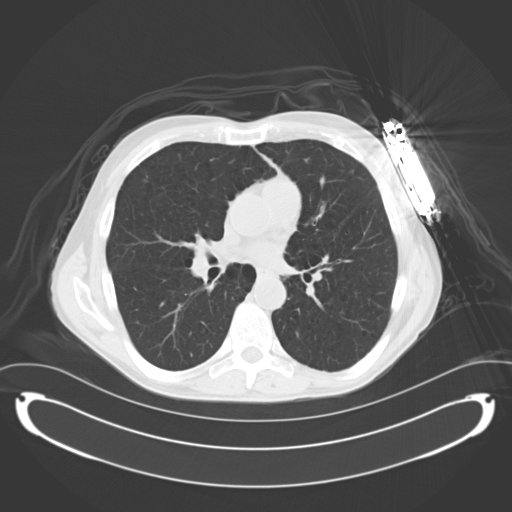
[im 114/137  lung]
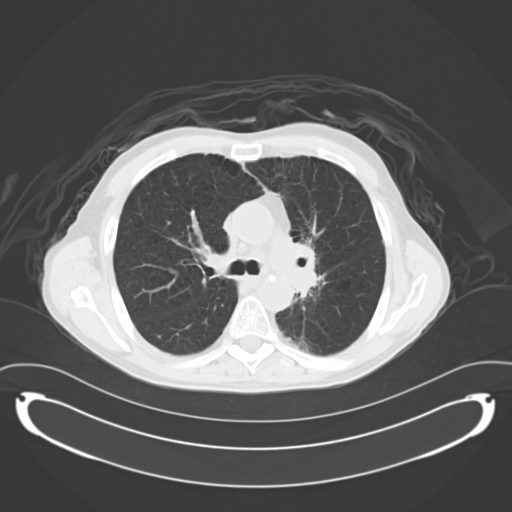
[im 129/137  lung]
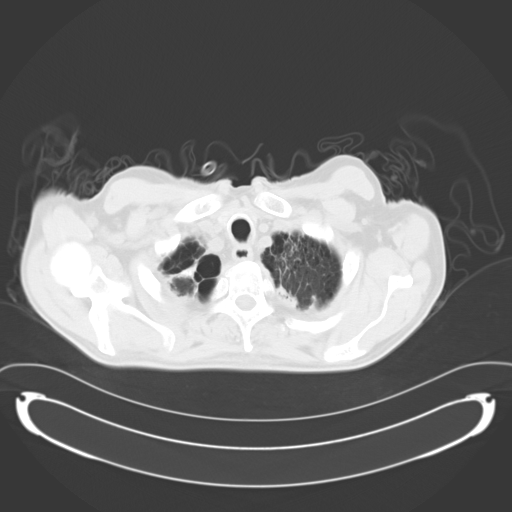

[Series 602: coronal images · coronal · 1.37mm/px · 3 of 83 slices shown]
[im 17/83  lung]
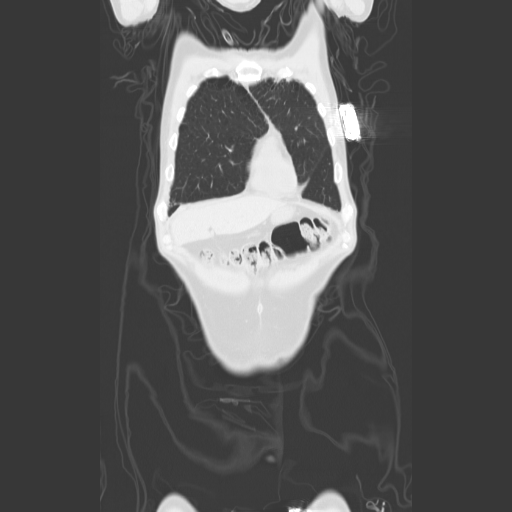
[im 33/83  lung]
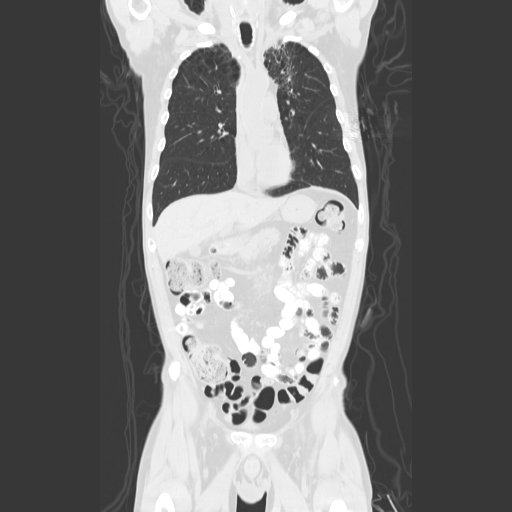
[im 50/83  lung]
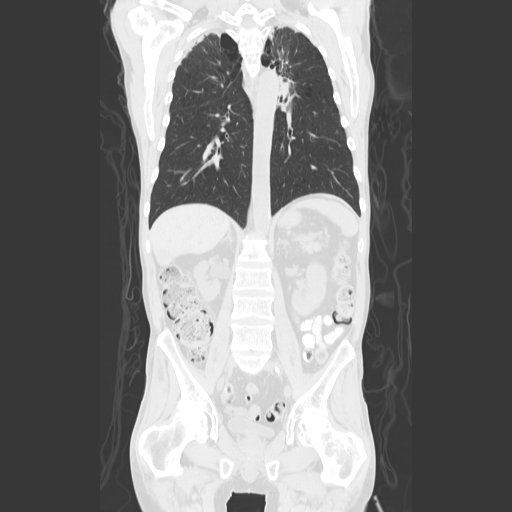

[14 of 36 positions shown; findings below may reference images not displayed]

FINDINGS: CT CHEST FINDINGS

Mediastinum: Normal heart size. Calcified atherosclerotic plaque
involves the thoracic aorta. No mediastinal or hilar adenopathy.
There is no axillary or supraclavicular adenopathy.

Lungs/Pleura: No pleural effusions identified. Moderate to advanced
changes of centrilobular and paraseptal emphysema identified.
Paramediastinal radiation change identified within the left lung.
This is similar to previous exam. No suspicious pulmonary nodule or
mass identified to suggest local recurrence of disease.

Musculoskeletal: No aggressive lytic or sclerotic bone lesions
identified.

CT ABDOMEN AND PELVIS FINDINGS

Hepatobiliary: Index lesion within the right lobe of liver measures
9 mm, image number 61/series 2. This is stable from previous exam.
Inferior right hepatic lobe lesion measures 1 cm, image 64/ series
2. Previously 1.1 cm. The index lesion within the inferior right
hepatic lobe measures 1.6 cm, image 69/ series 2. Previously 1.7 cm.
The gallbladder appears normal. There is no biliary dilatation.

Pancreas: Normal appearance of the pancreas.

Spleen: The spleen is unremarkable.

Adrenals/Urinary Tract: Right adrenal gland mass measures 2.2 x
cm, image 64/ series 2. Previously 2.8 x 2.7 cm. The upper pole
right kidney mass measures 2.1 x 2.4 cm, image 68/series 2.
Previously this measured 2.4 x 2.0 cm. Similar appearance of right
hydronephrosis. The left kidney appears normal.

Stomach/Bowel: Normal appearance of the stomach. The small bowel
loops have a normal course and caliber. No obstruction. Normal
appearance of the colon.

Vascular/Lymphatic: There is calcified atherosclerotic disease
involving the abdominal aorta. No aneurysm. No retroperitoneal
adenopathy identified. No mesenteric adenopathy. There is no pelvic
or inguinal adenopathy.

Reproductive: Prostate gland and seminal vesicles are unremarkable.

Other: The previously referenced pericaval lesion has decreased in
size. This measures 1.6 x 1.1 cm, image 44 of series 602. Previously
2.9 x 2.6 cm.

Musculoskeletal: Lytic lesion in the right iliac bone is identified
measure 1.5 cm, image 92/series 2. This is unchanged from previous
exam. I suspect there is probably a developing a lytic lesion
involving the L3 vertebra. This measures roughly 2.7 cm, image
64/series 2. Bilateral L5 pars defects identified.
IMPRESSION: 1. No acute findings within the chest abdomen or pelvis.
2. No evidence for residual or recurrent tumor within the chest.
3. Stable appearance of multi focal liver lesions.
4. Decrease in size of pericapsular lesion and right adrenal
metastasis.
5. Bone metastasis involve the right iliac bone and also likely
involve the L3 vertebra. These have slowly evolved over the past 12
months.
6. Persistent right hydronephrosis.

## 2015-11-23 ENCOUNTER — Ambulatory Visit: Payer: Medicaid Other | Admitting: Radiation Oncology

## 2015-11-23 ENCOUNTER — Ambulatory Visit: Payer: Medicaid Other
